# Patient Record
Sex: Female | Born: 1960 | Race: Black or African American | Hispanic: No | Marital: Married | Smoking: Never smoker
Health system: Southern US, Community
[De-identification: ages and names within clinical notes are randomized; demographics above are authoritative.]

## PROBLEM LIST (undated history)

## (undated) DIAGNOSIS — F32A Depression, unspecified: Secondary | ICD-10-CM

## (undated) DIAGNOSIS — F329 Major depressive disorder, single episode, unspecified: Secondary | ICD-10-CM

## (undated) DIAGNOSIS — K219 Gastro-esophageal reflux disease without esophagitis: Secondary | ICD-10-CM

## (undated) DIAGNOSIS — R011 Cardiac murmur, unspecified: Secondary | ICD-10-CM

## (undated) DIAGNOSIS — F419 Anxiety disorder, unspecified: Secondary | ICD-10-CM

## (undated) DIAGNOSIS — M069 Rheumatoid arthritis, unspecified: Secondary | ICD-10-CM

## (undated) DIAGNOSIS — M1990 Unspecified osteoarthritis, unspecified site: Secondary | ICD-10-CM

## (undated) DIAGNOSIS — I10 Essential (primary) hypertension: Secondary | ICD-10-CM

## (undated) DIAGNOSIS — T7840XA Allergy, unspecified, initial encounter: Secondary | ICD-10-CM

## (undated) DIAGNOSIS — M2550 Pain in unspecified joint: Secondary | ICD-10-CM

## (undated) DIAGNOSIS — Z803 Family history of malignant neoplasm of breast: Secondary | ICD-10-CM

## (undated) DIAGNOSIS — Z800 Family history of malignant neoplasm of digestive organs: Secondary | ICD-10-CM

## (undated) HISTORY — DX: Pain in unspecified joint: M25.50

## (undated) HISTORY — DX: Depression, unspecified: F32.A

## (undated) HISTORY — DX: Rheumatoid arthritis, unspecified: M06.9

## (undated) HISTORY — DX: Family history of malignant neoplasm of digestive organs: Z80.0

## (undated) HISTORY — DX: Gastro-esophageal reflux disease without esophagitis: K21.9

## (undated) HISTORY — DX: Unspecified osteoarthritis, unspecified site: M19.90

## (undated) HISTORY — DX: Essential (primary) hypertension: I10

## (undated) HISTORY — DX: Major depressive disorder, single episode, unspecified: F32.9

## (undated) HISTORY — DX: Anxiety disorder, unspecified: F41.9

## (undated) HISTORY — DX: Family history of malignant neoplasm of breast: Z80.3

## (undated) HISTORY — DX: Allergy, unspecified, initial encounter: T78.40XA

## (undated) HISTORY — PX: OTHER SURGICAL HISTORY: SHX169

---

## 1998-07-21 ENCOUNTER — Ambulatory Visit (HOSPITAL_COMMUNITY): Admission: RE | Admit: 1998-07-21 | Discharge: 1998-07-21 | Payer: Self-pay | Admitting: Obstetrics and Gynecology

## 1998-07-21 ENCOUNTER — Encounter: Payer: Self-pay | Admitting: Obstetrics and Gynecology

## 1999-03-09 ENCOUNTER — Other Ambulatory Visit: Admission: RE | Admit: 1999-03-09 | Discharge: 1999-03-09 | Payer: Self-pay | Admitting: Obstetrics and Gynecology

## 1999-09-11 ENCOUNTER — Emergency Department (HOSPITAL_COMMUNITY): Admission: EM | Admit: 1999-09-11 | Discharge: 1999-09-11 | Payer: Self-pay | Admitting: Emergency Medicine

## 1999-09-11 ENCOUNTER — Encounter: Payer: Self-pay | Admitting: Emergency Medicine

## 1999-09-21 ENCOUNTER — Encounter: Payer: Self-pay | Admitting: Emergency Medicine

## 1999-09-21 ENCOUNTER — Emergency Department (HOSPITAL_COMMUNITY): Admission: EM | Admit: 1999-09-21 | Discharge: 1999-09-21 | Payer: Self-pay | Admitting: Emergency Medicine

## 1999-12-30 ENCOUNTER — Other Ambulatory Visit: Admission: RE | Admit: 1999-12-30 | Discharge: 1999-12-30 | Payer: Self-pay | Admitting: Family Medicine

## 2000-01-20 ENCOUNTER — Encounter: Admission: RE | Admit: 2000-01-20 | Discharge: 2000-01-20 | Payer: Self-pay | Admitting: Family Medicine

## 2000-01-20 ENCOUNTER — Encounter: Payer: Self-pay | Admitting: Family Medicine

## 2000-09-22 ENCOUNTER — Other Ambulatory Visit: Admission: RE | Admit: 2000-09-22 | Discharge: 2000-09-22 | Payer: Self-pay | Admitting: Obstetrics and Gynecology

## 2000-11-07 ENCOUNTER — Inpatient Hospital Stay (HOSPITAL_COMMUNITY): Admission: RE | Admit: 2000-11-07 | Discharge: 2000-11-11 | Payer: Self-pay | Admitting: Obstetrics and Gynecology

## 2000-11-07 ENCOUNTER — Encounter (INDEPENDENT_AMBULATORY_CARE_PROVIDER_SITE_OTHER): Payer: Self-pay | Admitting: Specialist

## 2001-06-13 ENCOUNTER — Other Ambulatory Visit: Admission: RE | Admit: 2001-06-13 | Discharge: 2001-06-13 | Payer: Self-pay | Admitting: Obstetrics and Gynecology

## 2001-11-02 ENCOUNTER — Inpatient Hospital Stay (HOSPITAL_COMMUNITY): Admission: AD | Admit: 2001-11-02 | Discharge: 2001-11-02 | Payer: Self-pay | Admitting: Obstetrics and Gynecology

## 2001-12-21 ENCOUNTER — Inpatient Hospital Stay (HOSPITAL_COMMUNITY): Admission: AD | Admit: 2001-12-21 | Discharge: 2001-12-24 | Payer: Self-pay | Admitting: Obstetrics and Gynecology

## 2002-01-01 ENCOUNTER — Encounter: Admission: RE | Admit: 2002-01-01 | Discharge: 2002-01-31 | Payer: Self-pay | Admitting: Obstetrics and Gynecology

## 2002-02-05 ENCOUNTER — Other Ambulatory Visit: Admission: RE | Admit: 2002-02-05 | Discharge: 2002-02-05 | Payer: Self-pay | Admitting: Obstetrics and Gynecology

## 2002-08-15 HISTORY — PX: ABDOMINAL HYSTERECTOMY: SHX81

## 2002-12-16 ENCOUNTER — Encounter: Admission: RE | Admit: 2002-12-16 | Discharge: 2003-03-16 | Payer: Self-pay | Admitting: Family Medicine

## 2003-01-06 ENCOUNTER — Ambulatory Visit (HOSPITAL_COMMUNITY): Admission: RE | Admit: 2003-01-06 | Discharge: 2003-01-06 | Payer: Self-pay | Admitting: Gastroenterology

## 2003-03-13 ENCOUNTER — Other Ambulatory Visit: Admission: RE | Admit: 2003-03-13 | Discharge: 2003-03-13 | Payer: Self-pay | Admitting: Obstetrics and Gynecology

## 2004-01-09 ENCOUNTER — Emergency Department (HOSPITAL_COMMUNITY): Admission: EM | Admit: 2004-01-09 | Discharge: 2004-01-10 | Payer: Self-pay | Admitting: Emergency Medicine

## 2004-02-17 ENCOUNTER — Encounter: Admission: RE | Admit: 2004-02-17 | Discharge: 2004-02-17 | Payer: Self-pay | Admitting: Family Medicine

## 2004-03-30 ENCOUNTER — Other Ambulatory Visit: Admission: RE | Admit: 2004-03-30 | Discharge: 2004-03-30 | Payer: Self-pay | Admitting: Obstetrics and Gynecology

## 2004-11-14 ENCOUNTER — Emergency Department (HOSPITAL_COMMUNITY): Admission: EM | Admit: 2004-11-14 | Discharge: 2004-11-14 | Payer: Self-pay | Admitting: Family Medicine

## 2006-11-26 ENCOUNTER — Emergency Department (HOSPITAL_COMMUNITY): Admission: EM | Admit: 2006-11-26 | Discharge: 2006-11-26 | Payer: Self-pay | Admitting: Emergency Medicine

## 2007-01-19 ENCOUNTER — Inpatient Hospital Stay (HOSPITAL_COMMUNITY): Admission: RE | Admit: 2007-01-19 | Discharge: 2007-01-21 | Payer: Self-pay | Admitting: Obstetrics and Gynecology

## 2007-01-19 ENCOUNTER — Encounter (INDEPENDENT_AMBULATORY_CARE_PROVIDER_SITE_OTHER): Payer: Self-pay | Admitting: Obstetrics and Gynecology

## 2010-07-09 ENCOUNTER — Ambulatory Visit (HOSPITAL_COMMUNITY)
Admission: RE | Admit: 2010-07-09 | Discharge: 2010-07-09 | Payer: Self-pay | Source: Home / Self Care | Admitting: Obstetrics and Gynecology

## 2010-07-19 ENCOUNTER — Encounter: Admission: RE | Admit: 2010-07-19 | Discharge: 2010-07-19 | Payer: Self-pay | Admitting: Obstetrics and Gynecology

## 2010-12-31 NOTE — H&P (Signed)
Aimee [name], Aimee [name]                ACCOUNT NO.:  [id number]   MEDICAL RECORD NO.:  [id number]          PATIENT TYPE:  AMB   LOCATION:  SDC                           FACILITY:  [facility]   PHYSICIAN:  Duke Salvia. Marcelle Overlie, M.D.DATE OF BIRTH:  05-05-1961   DATE OF ADMISSION:  DATE OF DISCHARGE:                              HISTORY & PHYSICAL   CHIEF COMPLAINT:  Pelvic pain.   HISTORY OF PRESENT ILLNESS:  50 year old who has a complicated  obstetrical history.  She had a vaginal delivery in 1984 and then from  1992 to 1995 she had four ectopic pregnancies that variously required  salpingostomy or salpingectomy.  In 2002, she had a myomectomy and did  have omental adhesions noted at that time, had IVF in late 2002, primary  cesarean section in 2003, both tubes were surgically absent at that time  and she had some significant small bowel adhesions noted above her  Pfannenstiel incision.  She has continued to have problems with  worsening pelvic pain over the last six months.  Ultrasound recently in  our office showed several new fibroids, possible adenomyosis, with a  complex cystic mass on the right side with some areas of septation and  probable corpus luteum cysts.  The dimensions were 3.7 x 2.7 x 3.0.  No  free fluid was noted.  Due to the severity of the pain, she presents now  for definitive TAH, possible BSO, after bowel prep.  This procedure,  including risk of bleeding, infection, adjacent organ injury, along with  other complications related to wound infection, phlebitis, transfusion,  along with her expected recovery time and the possible need for ERT  discussed.  Unless she has one completely normal ovary, her preference  would be to have both ovaries removed.   PAST MEDICAL HISTORY:   ALLERGIES:  None.   PAST SURGICAL HISTORY:  Myomectomy, laparotomy for  salpingostomy/salpingectomy, and cesarean section.   FAMILY HISTORY:  Significant for hypertension - mother, father, and  patient.  Her dad had a stroke.   CURRENT MEDICATIONS:  Xanax, Lexapro, and Diovan.   PHYSICAL EXAMINATION:  VITAL SIGNS:  Temp 98.2, blood pressure 130/78.  HEENT:  Unremarkable.  NECK:  Supple without masses.  LUNGS:  Clear.  CARDIOVASCULAR:  Regular rate and rhythm without murmurs, rubs, gallops.  BREASTS:  Without masses.  ABDOMEN:  Soft, flat, nontender.  PELVIC:  Normal external genitalia, vagina and cervix clear.  Uterus  upper limit of normal size, adnexa unremarkable.  EXTREMITIES:  Unremarkable.  NEUROLOGIC:  Unremarkable.   IMPRESSION:  1. Chronic pelvic pain.  2. History of prior myomectomy and pelvic adhesive disease.  3. Adenomyosis.   PLAN:  TAH, BSO.  Procedure and risks reviewed as above.      Richard M. Marcelle Overlie, M.D.  Electronically Signed     RM[facility]/MEDQ  D:  01/16/2007  T:  01/16/2007  Job:  [id number]

## 2010-12-31 NOTE — Op Note (Signed)
[facility]  Patient:    Aimee [name], Aimee [name]                       MRN: [id number] Proc. Date: 11/07/00 Adm. Date:  [id number] Attending:  Rhina Brackett                           Operative Report  PREOPERATIVE DIAGNOSIS:       Symptomatic leiomyoma with anemia.  POSTOPERATIVE DIAGNOSIS:      Symptomatic leiomyoma with anemia.  PROCEDURE:                    Myomectomy.  SURGEON:                      Duke Salvia. Marcelle Overlie, M.D.  ASSISTANT:                    Trevor Iha, M.D.  ANESTHESIA:                   General endotracheal.  COMPLICATIONS:                None.  DRAINS:                       Foley catheter.  ESTIMATED BLOOD LOSS:         350 cc.  DESCRIPTION OF PROCEDURE:     The patient was taken to the operating room. After an adequate level of general endotracheal anesthesia was obtained, the patient supine, the abdomen was prepped and draped in the usual manner for sterile abdominal procedures.  A Foley catheter was positioned.  A transverse incision was made through the old Pfannenstiel scar and carried down to the fascia, which was incised and extended transversely.  The rectus muscle was divided in the midline.  There were some omental adhesions noted, which were carefully dissected.  Small bleeders were secured with #3-0 Dexon free ties. No bowel was noted to be adherent into the abdominal wall.  Once the omental adhesions were freed, the pelvis was clear.  The bowel was packed superiorly out of the field.  A large fundal fibroid was delivered through the incision. Inspection of the remainder of the uterus revealed one other small fibroid 2.5 cm in the left anterior fundus.  Both tubes were surgically absent.  Both ovaries appeared to be normal.  There were some adhesions surrounding the right ovary for approximately 25% along the medial aspect of the broad ligament.  The left ovary was free and clear.  No other pelvic  abnormalities were noted.  Dilute Pitressin solution was then injected into the large fibroid.  An incision was made with the Bovie, and sharp and blunt dissection used to free a large 10.0 cm irregularly-shaped fibroid.  Dexon #0 deep sutures were used on the myometrium, and a mattress suture of #2-0 Dexon on the serosa, with excellent hemostasis.  The 2.5 cm anterior fibroid was removed through a separate small incision and repaired in a similar manner. These areas were hemostatic after irrigation, and interceed was placed.  Prior to closure the sponge, needle, and instrument counts were reported as correct x 2.  The rectus muscles were reapproximated with #3-0 Dexon interrupted suture.  The pouch was closed with a #0 Panacryl suture.  The subcutaneous fat was hemostatic.  Clips and Steri-Strips were  used on the skin.  She tolerated this well and went to the recovery room in good condition. DD:  11/07/00 TD:  11/07/00 Job: [id number]

## 2010-12-31 NOTE — Op Note (Signed)
[facility]  Patient:    Aimee [name], Aimee [name] Visit Number: [id number] MRN: [id number]          Service Type: OBS Location: 910A 9148 01 Attending Physician:  Rhina Brackett Dictated by:   Duke Salvia. Marcelle Overlie, M.D. Proc. Date: 12/20/01 Admit Date:  12/21/2001                             Operative Report  PREOPERATIVE DIAGNOSES:       Term intrauterine pregnancy, previous myomectomy.  POSTOPERATIVE DIAGNOSES:      Term intrauterine pregnancy, previous myomectomy, dense small bowel adhesions.  PROCEDURE:                    Primary cesarean section, lysis of extensive adhesions.  SURGEON:                      Duke Salvia. Marcelle Overlie, M.D.  ANESTHESIA:                   Spinal.  COMPLICATIONS:                None.  DRAINS:                       Foley catheter.  ESTIMATED BLOOD LOSS:         900.  PROCEDURE AND FINDINGS:       Patient taken to the operating room.  After an adequate level of spinal anesthetic was obtained with the patient supine, the abdomen was prepped and draped in usual manner for sterile abdominal procedures.  Foley catheter positioned draining clear urine.  Pfannenstiel incision made through the old myomectomy scar.  This was carried down to the fascia which was incised and extended transversely.  Rectus muscles divided in midline.  Peritoneum entered superiorly in a careful fashion due to her prior surgical history.  A clear window was noted but there were some bowel adherent into the upper margin of the incision which was avoided at that time since there was enough room inferior to delivery the fetus.  A bladder flap was delivered, transverse incision made in the lower segment, extended with bandage scissors.  A healthy female infant was then delivered.  Apgars 9 and 10.  The placenta delivered manually intact.  Uterus could not be exteriorized due to adhesions.  The uterine cavity was wiped clean with laparotomy  pack. Closure obtained first layer 0 chromic in a locked fashion followed by an embrocating layer of 0 chromic.  This was hemostatic.  The bladder flap area was well below and was intact.  After this was completed with sharp and blunt dissection, the small bowel was dissected free.  A number of adhesions were lysed in a vascular plane freeing up the small bowel adhesions into the upper incision.  Once this was freed up the bowel was run.  On careful inspection there were no mucosal defects.  Two areas where the serosa was partly denuted were repaired transversely with a running 3-0 silk suture.  Prior to closure sponge, needle, instrument counts reported as correct x2.  The rectus muscles reapproximated with 2-0 Dexon interrupted sutures.  Fascia closed from laterally to midline either side with a 0 PDS suture.  Steri-Strips and clips on the skin and a pressure dressing applied.  She did receive Ancef 1 g IV and Pitocin IV after the  cord was clamped.  She tolerated this well.  Went to recovery room in good condition. Dictated by:   Duke Salvia. Marcelle Overlie, M.D. Attending Physician:  Rhina Brackett DD:  12/21/01 TD:  12/24/01 Job: [id number]

## 2010-12-31 NOTE — Discharge Summary (Signed)
Aimee [name], Aimee [name]                ACCOUNT NO.:  [id number]   MEDICAL RECORD NO.:  [id number]          PATIENT TYPE:  INP   LOCATION:  9304                          FACILITY:  [facility]   PHYSICIAN:  Duke Salvia. Marcelle Overlie, M.D.DATE OF BIRTH:  20-Nov-1960   DATE OF ADMISSION:  01/19/2007  DATE OF DISCHARGE:  01/21/2007                               DISCHARGE SUMMARY   DISCHARGE DIAGNOSES:  1. Symptomatic leiomyoma, possible adenomyosis with pelvic adhesions.  2. Total abdominal hysterectomy/bilateral salpingo-oophorectomy this      admission.   SUMMARY OF THE HISTORY AND PHYSICAL EXAM:  Please see Admission H&P for  details.  Briefly, a 50 year old who has had prior myomectomy and 4  prior salpingostomy or salpingectomy procedures for ectopic pregnancy.  She presents with worsening pelvic pain.  Ultrasound showed recurrent  leiomyoma with adenomyosis.  At the time of her last cesarean was noted  to have significant pelvic adhesions.   HOSPITAL COURSE:  On June 6, under general anesthesia, the patient  underwent laparotomy with lysis of adhesions, TAH/BSO.  On the first  postoperative day, her potassium had recovered to normal at 4,  hemoglobin 7.9, down from a preop of 10.9.  Her diet was advanced.  By  June 8, she was afebrile, tolerating a regular diet, ambulating without  difficulty, her abdominal exam was unremarkable and she was ready for  discharge at that point.   LABORATORY DATA:  CMET on admission normal except for sodium 131,  potassium 3.3, glucose 101.  Preop CBC:  A WBC 6.6, hemoglobin 10.6,  hematocrit 33.  Blood type is O positive, antibody screen is negative.  On June 7, SMA-7 was normal with potassium 4.  Postop on January 20, 2007:  A WBC 9.4, hemoglobin 7.9, hematocrit 24.3.   DISPOSITION:  1. The patient discharged on Tandem FE one p.o. daily, Tylox p.r.n.      pain.  2. Will return to the office in 2 days to have the clips removed.  3. Advised to report any incisional  redness or drainage, increased      pain or bleeding or fever over 101.  4. She was given specific instructions regarding diet, and exercise.   CONDITION:  Good.   ACTIVITY:  At graded increase.      Richard M. Marcelle Overlie, M.D.  Electronically Signed     RM[facility]/MEDQ  D:  01/21/2007  T:  01/21/2007  Job:  [id number]

## 2010-12-31 NOTE — Op Note (Signed)
   NAMEMARCEL, SORTER                          ACCOUNT NO.:  [id number]   MEDICAL RECORD NO.:  [id number]                   PATIENT TYPE:  AMB   LOCATION:  ENDO                                 FACILITY:  [facility]   PHYSICIAN:  James L. Malon Kindle., M.D.          DATE OF BIRTH:  1961/02/28   DATE OF PROCEDURE:  01/06/2003  DATE OF DISCHARGE:                                 OPERATIVE REPORT   PROCEDURE:  Colonoscopy.   MEDICATIONS:  Fentanyl 162.5 mcg, Versed 16 mg IV.   INDICATIONS:  Patient with strong family history of colon cancer.   DESCRIPTION OF PROCEDURE:  The procedure had been explained to the patient  and consent obtained.  With the patient in the left lateral decubitus  position, the Olympus adjustable pediatric colonoscope was inserted and  advanced.  The prep was excellent.  Using slight abdominal pressure, we were  able to easily advance to the cecum.  The ileocecal valve and the  appendiceal orifice were seen.  The scope was withdrawn and the cecum,  ascending colon, hepatic flexure, transverse colon, splenic flexure,  descending and sigmoid colon were seen well.  No significant diverticular  disease.  No polyps were seen throughout the entire colon.  The scope was  withdrawn.  The patient tolerated the procedure well.   ASSESSMENT:  Strong family history of colon cancer with negative  colonoscopy.   PLAN:  Recommend routine yearly Hemoccults and repeat a screening  colonoscopy in five years.                                                James L. Malon Kindle., M.D.    Waldron Session  D:  01/06/2003  T:  01/06/2003  Job:  [id number]   cc:   Duwayne Heck L. Mahaffey, M.D.  [address]  [place]  [place] [address]  Fax: [phone]

## 2010-12-31 NOTE — H&P (Signed)
[facility]  Patient:    Aimee [name], Aimee [name] Visit Number: [id number] MRN: [id number]          Service Type: OBS Location: 910A 9148 01 Attending Physician:  Rhina Brackett Dictated by:   Duke Salvia. Marcelle Overlie, M.D. Admit Date:  12/21/2001                           History and Physical  CHIEF COMPLAINT:              For primary cesarean section at term.  HISTORY OF PRESENT ILLNESS:   A 50 year old G6 P1-0-4-1, EDD is Dec 29, 2001, presents for primary cesarean section with history of a prior myomectomy.  Her obstetrical history is complicated in that she has had one vaginal delivery in 1984, had an ectopic pregnancy four times - in 1992, 1993, 1994, and 1995.  In March 2002 she underwent myomectomy due to menorrhagia and anemia.  She conceived after one cycle of IVF - both tubes being absent from her ectopic surgery - and has had a relatively uncomplicated pregnancy.  She did have a preexisting history of chronic hypertension, but off medications has had a normal BP without medication during the pregnancy.  PAST MEDICAL HISTORY:  ALLERGIES:                    None.  OPERATIONS:                   Prior ectopic surgery and myomectomy.  MEDICATIONS:                  Prenatal vitamins and Celexa.  PRENATAL LABORATORY DATA:     Blood type O positive.  Rubella titer positive.  PHYSICAL EXAMINATION:  VITAL SIGNS:                  Temperature 98.2, blood pressure 120/84.  HEENT:                        Unremarkable.  NECK:                         Supple without masses.  LUNGS:                        Clear.  CARDIOVASCULAR:               Regular rate and rhythm without murmurs, rubs, or gallops noted.  BREASTS:                      Not examined.  ABDOMEN:                      Term fundal height, fetal heart rate 140.  PELVIC:                       Cervix was closed.  EXTREMITIES AND NEUROLOGIC:   Unremarkable.  IMPRESSION:                    1. Term intrauterine pregnancy, prior                                  myomectomy, history of ectopic pregnancy.  2. In vitro fertilization pregnancy.  PLAN:                         Primary cesarean section.  This procedure including risks relative to bleeding, infection, adjacent organ injury all reviewed with her, which she understands and accepts. Dictated by:   Duke Salvia. Marcelle Overlie, M.D. Attending Physician:  Rhina Brackett DD:  12/21/01 TD:  12/21/01 Job: [phone]

## 2010-12-31 NOTE — H&P (Signed)
[facility]  Patient:    Aimee [name], Aimee [name]                         MRN: [id number] Attending:  Duke Salvia. Marcelle Overlie, M.D.                         History and Physical  CHIEF COMPLAINT:              1. Anemia.                               2. Chronic pelvic pain secondary to leiomyoma.  HISTORY OF PRESENT ILLNESS:   The patient is a 50 year old G5 P1, currently on Celexa and Xanax per Dr. Evelene Croon.  She has had problems with worsening menorrhagia and dysmenorrhea.  The previously noted fibroid has increased by 9 cm.  She presents now for myomectomy.  PAST MEDICAL HISTORY:         1. Hypertension.                               2. Her history is significant for four prior                                  ectopic pregnancies after a vaginal delivery                                  in 1984.  She has had several laparotomies                                  for treatment of ectopic pregnancy and                                  several treated by laparoscopic procedures.  ALLERGIES:                    No known drug allergies.  CURRENT MEDICATIONS:          1. Blood pressure medications.                               2. Celexa.                               3. Xanax.  PAST SURGICAL HISTORY:        1. Fracture of left arm at age 80.                               2. Ectopic pregnancies, as mentioned, with                                  secondary infertility.  I have consulted  Dr. Elesa Hacker about IVF.  The most recent                                  ultrasound obtained on September 28, 2000                                  showed a large submucous fibroid 10 x 9                                  x 9.6 cm.  PHYSICAL EXAMINATION:  VITAL SIGNS:                  Temperature 98.2 degrees, blood pressure 120/90.  HEENT:                        Unremarkable.  NECK:                         Supple, without masses.  LUNGS:                         Clear.  CARDIOVASCULAR:               Regular rate and rhythm, without murmurs, rubs, or gallops.  BREAST:                       Without masses.  ABDOMEN:                      Soft, flat, nontender.  PELVIC:                       Normal external genitalia, vagina; cervix clear; uterus 12-14 week size and irregular; adnexa unremarkable.  EXTREMITIES:                  Unremarkable.  NEUROLOGIC:                   Unremarkable.  IMPRESSION:                   Symptomatic leiomyoma.  PLAN:                         Myomectomy.  This procedure including risk of bleeding, infection, adjacent organ injury, possible need for transfusion, the need to be delivered by cesarean section have all been discussed with her. 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  We discussed the possibility of wound infection, phlebitis, and her expected recovery time.  All her questions were answered. DD:  11/02/00 TD:  11/02/00 Job: [id number]

## 2010-12-31 NOTE — Op Note (Signed)
NAMECRYSTALMARIE, YASIN                ACCOUNT NO.:  [id number]   MEDICAL RECORD NO.:  [id number]          PATIENT TYPE:  AMB   LOCATION:  SDC                           FACILITY:  [facility]   PHYSICIAN:  Duke Salvia. Marcelle Overlie, M.D.DATE OF BIRTH:  [birth year]   DATE OF PROCEDURE:  01/19/2007  DATE OF DISCHARGE:                               OPERATIVE REPORT   PREOPERATIVE DIAGNOSIS:  Pelvic pain, leiomyoma history of prior  myomectomy, adenomyosis.   POSTOPERATIVE DIAGNOSIS:  Pelvic pain, leiomyoma history of prior  myomectomy, adenomyosis, plus pelvic adhesions.   PROCEDURE:  TAH-BSO.   SURGEON:  Dr. Marcelle Overlie   ASSISTANT:  Dr. Rana Snare.   ANESTHESIA:  General endotracheal.   COMPLICATIONS:  None.   DRAINS:  Foley catheter.   BLOOD LOSS:  250 mL.   SPECIMENS REMOVED:  The uterus, bilateral tubes and ovaries.   PROCEDURE AND FINDINGS:  The patient taken to the operating room, after  an adequate level of general endotracheal anesthesia was obtained, the  patient supine, the abdomen prepped and draped with Betadine.  The  vagina was then prepped separately with Betadine.  Foley catheter  positioned.  Transverse incision made excising the old scar, carried  down fascia which was incised and extended transversely.  Rectus muscle  divided midline.  Peritoneum entered superiorly without incident and  extended in vertical fashion.  Loop of bowel was noted to be adherent  into the right lower quadrant. With traction and counter traction this  was dissected free in avascular plane.  There were several loops of  bowel that were adherent to each other that were freed up by dissecting  filmy adhesions.  Once this was freed up the O'Connor-O'Sullivan  retractor was positioned.  Bowels packed superiorly out of the field and  she was placed in Trendelenburg.  The uterus itself was enlarged to 8-10  weeks size.  There was a degenerated fibroid coming off the fundus.  Bilateral adnexal adhesions were freed  up freeing up each ovary on each  side.  [name] clamps were placed at each utero-ovarian pedicle. Starting  on the left the round ligament was clamped, divided and suture-ligated  with 0 Vicryl.  The peritoneum was carried around anteriorly and the  retroperitoneal space on the left was developed. Course of the ureter  was well below. The left IP ligament was then isolated, clamped, divided  first free tied followed by suture ligature 0 Dexon assuring that the  ureter was well below.  The exact same dissection repeated on the  opposite side, carefully identifying the ureter well below.  Peritoneum  then carried around each side to the midline.  The bladder was sharply  and bluntly dissected below the cervical vaginal junction. Uterine  vasculature pedicle on either side was skeletonized. LigaSure was used  at that point, was clamped, coagulated and divided, staying close to the  uterus and assuring that the bladder was well below. The upper broad  ligament, cardinal ligament were coagulated and divided. Curved Heaney  clamps were then used to clamp, divide and suture-ligated in Heaney  fashion the cervical  vaginal junction and the specimen was excised.  The  cuff was closed with a running lock 2-0 Vicryl suture.  Pelvis was then  irrigated with saline and aspirated.  All the operative site areas were  inspected and noted to be hemostatic.  McCall's culdoplasty suture was  then placed from picking up left uterosacral ligament, posterior  peritoneum across to the right uterosacral ligament which was tied down  for extra posterior support.  Prior to closure sponge, needle, and  instrument counts reported as correct x2.  Peritoneum closed with a  running 2-0 Vicryl suture. 2-0 Vicryl was used in interrupted sutures to  reapproximate the rectus muscles.  A 0 PDS was then used from laterally  to midline on either side to close the fascia.  A subfascial and  subcutaneous On-Q catheter was  positioned through separate incisions.  The subcutaneous tissue was undermined to allow for better closure.  Rendered hemostatic with Bovie. Incision closed with Steri-Strips and  clips and a pressure dressing.  The On-Q catheters were primed per  protocol.  She tolerated this well, went to recovery room in good  condition.      Richard M. Marcelle Overlie, M.D.  Electronically Signed     RMH/MEDQ  D:  01/19/2007  T:  01/19/2007  Job:  [id number]

## 2010-12-31 NOTE — Discharge Summary (Signed)
[facility]  Patient:    Aimee [name], Aimee [name] Visit Number: [id number] MRN: [id number]          Service Type: OBS Location: [id number] Attending Physician:  Rhina Brackett Dictated by:   Julio Sicks, N.P. Admit Date:  12/21/2001 Discharge Date: 12/24/2001                             Discharge Summary  ADMISSION DIAGNOSES:          1. Intrauterine pregnancy at term.                               2. Previous myomectomy.  DISCHARGE DIAGNOSES:          1. Primary low transverse cesarean delivery.                               2. Lysis of adhesions.                               3. Viable female infant.  PROCEDURE:                    1. Primary low transverse cesarean section.                               2. Lysis of adhesions.  REASON FOR ADMISSION:         Please see dictated H&P.  HOSPITAL COURSE:              The patient was admitted to the hospital for a scheduled primary cesarean delivery.  The patient was taken to the operating room where spinal anesthesia was administered without complication.  A low transverse incision was made with delivery of a viable female infant weighing 7 pounds 14 ounces with Apgars of 9 at one minute and 10 at 10 minutes.  The patient tolerated the procedure well and was taken to the recovery room. On postoperative day one temperature was noted to be maximum of 101.3.  IV antibiotics were administered.  Abdomen was soft.  Fundus was firm. Incision was clean, dry and intact.  The patient had good return of bowel function. She was tolerating a clear liquid diet without complaint.  Lungs were auscultated without evidence of wheeze, rales or rhonchi.  Hemoglobin was 8.3 and WBC count was 9.5.  On postoperative day two, the patient was afebrile. Abdomen was soft. Incision was clean, dry and intact.  IV antibiotics were discontinued.  On postoperative day three, the patient was doing well. Abdomen was soft.  Incision was clean, dry and intact.  Staples were removed and the patient was discharged home.  CONDITION ON DISCHARGE:       Good.  DIET:                         Regular as tolerated.  ACTIVITY:                     No heavy lifting.  No vaginal entry.  No driving times two weeks.  FOLLOW-UP:  The patient is to follow up in the office in one to two weeks for an incision check.  She is to call for a temperature greater than 100 degrees, persistent nausea and vomiting, heavy vaginal bleeding and/or redness or drainage from the incision site.  DISCHARGE MEDICATIONS:        1. Tylox one to two q.4-6h.                               2. Ferrous sulfate 325 mg one p.o. b.i.d.                               3. Prenatal vitamins one p.o. q.d. Dictated by:   Julio Sicks, N.P. Attending Physician:  Rhina Brackett DD:  01/09/02 TD:  01/10/02 Job: [id number]

## 2011-06-02 LAB — CBC
HCT: 24.3 — ABNORMAL LOW
HCT: 33 — ABNORMAL LOW
Hemoglobin: 10.6 — ABNORMAL LOW
MCHC: 32
MCV: 71.8 — ABNORMAL LOW
Platelets: 250
RBC: 4.63
RDW: 17.3 — ABNORMAL HIGH
RDW: 17.3 — ABNORMAL HIGH
WBC: 9.4

## 2011-06-02 LAB — COMPREHENSIVE METABOLIC PANEL
ALT: 16
Alkaline Phosphatase: 57
BUN: 6
CO2: 26
Calcium: 9
GFR calc non Af Amer: >60
Glucose, Bld: 101 — ABNORMAL HIGH
Potassium: 3.3 — ABNORMAL LOW
Sodium: 131 — ABNORMAL LOW
Total Protein: 7.6

## 2011-06-02 LAB — ELECTROLYTE PANEL
Chloride: 105
Potassium: 4
Sodium: 136

## 2011-06-02 LAB — TYPE AND SCREEN
ABO/RH(D): O POS
Antibody Screen: NEGATIVE

## 2011-08-16 HISTORY — PX: KNEE ARTHROSCOPY: SUR90

## 2012-09-06 ENCOUNTER — Other Ambulatory Visit: Payer: Self-pay | Admitting: Sports Medicine

## 2012-09-06 DIAGNOSIS — M25569 Pain in unspecified knee: Secondary | ICD-10-CM

## 2012-09-10 ENCOUNTER — Other Ambulatory Visit: Payer: Self-pay

## 2012-09-14 ENCOUNTER — Ambulatory Visit
Admission: RE | Admit: 2012-09-14 | Discharge: 2012-09-14 | Disposition: A | Discharge status: Home / Self Care | Payer: 59 | Source: Ambulatory Visit | Attending: Sports Medicine | Admitting: Sports Medicine

## 2012-09-14 DIAGNOSIS — M25569 Pain in unspecified knee: Secondary | ICD-10-CM

## 2013-01-01 ENCOUNTER — Other Ambulatory Visit: Payer: Self-pay | Admitting: Obstetrics and Gynecology

## 2013-01-01 DIAGNOSIS — R928 Other abnormal and inconclusive findings on diagnostic imaging of breast: Secondary | ICD-10-CM

## 2013-01-11 ENCOUNTER — Ambulatory Visit
Admission: RE | Admit: 2013-01-11 | Discharge: 2013-01-11 | Disposition: A | Discharge status: Home / Self Care | Payer: 59 | Source: Ambulatory Visit | Attending: Obstetrics and Gynecology | Admitting: Obstetrics and Gynecology

## 2013-01-11 DIAGNOSIS — R928 Other abnormal and inconclusive findings on diagnostic imaging of breast: Secondary | ICD-10-CM

## 2013-03-01 LAB — HM COLONOSCOPY

## 2013-07-09 ENCOUNTER — Other Ambulatory Visit (HOSPITAL_COMMUNITY): Payer: 59

## 2013-07-22 ENCOUNTER — Other Ambulatory Visit (HOSPITAL_COMMUNITY): Payer: 59

## 2013-08-06 ENCOUNTER — Other Ambulatory Visit (HOSPITAL_COMMUNITY): Payer: 59

## 2013-08-23 ENCOUNTER — Ambulatory Visit (HOSPITAL_COMMUNITY): Payer: 59 | Attending: Internal Medicine | Admitting: Radiology

## 2013-08-23 ENCOUNTER — Other Ambulatory Visit (HOSPITAL_COMMUNITY): Payer: Self-pay | Admitting: Family Medicine

## 2013-08-23 ENCOUNTER — Encounter: Payer: Self-pay | Admitting: Internal Medicine

## 2013-08-23 ENCOUNTER — Other Ambulatory Visit: Payer: Self-pay

## 2013-08-23 DIAGNOSIS — Z8249 Family history of ischemic heart disease and other diseases of the circulatory system: Secondary | ICD-10-CM | POA: Insufficient documentation

## 2013-08-23 DIAGNOSIS — Z6841 Body Mass Index (BMI) 40.0 and over, adult: Secondary | ICD-10-CM | POA: Insufficient documentation

## 2013-08-23 DIAGNOSIS — R011 Cardiac murmur, unspecified: Secondary | ICD-10-CM

## 2013-08-23 NOTE — Progress Notes (Signed)
Echocardiogram performed.  

## 2014-12-09 DIAGNOSIS — L723 Sebaceous cyst: Secondary | ICD-10-CM | POA: Insufficient documentation

## 2015-04-01 ENCOUNTER — Other Ambulatory Visit: Payer: Self-pay | Admitting: Obstetrics and Gynecology

## 2015-04-02 LAB — CYTOLOGY - PAP

## 2016-05-18 LAB — HM MAMMOGRAPHY

## 2016-06-27 ENCOUNTER — Other Ambulatory Visit: Payer: Self-pay | Admitting: Hematology and Oncology

## 2016-06-27 DIAGNOSIS — M519 Unspecified thoracic, thoracolumbar and lumbosacral intervertebral disc disorder: Secondary | ICD-10-CM

## 2016-06-29 ENCOUNTER — Other Ambulatory Visit: Payer: Self-pay

## 2016-09-30 DIAGNOSIS — B349 Viral infection, unspecified: Secondary | ICD-10-CM | POA: Diagnosis not present

## 2016-10-13 DIAGNOSIS — M25461 Effusion, right knee: Secondary | ICD-10-CM | POA: Diagnosis not present

## 2016-10-13 DIAGNOSIS — M170 Bilateral primary osteoarthritis of knee: Secondary | ICD-10-CM | POA: Diagnosis not present

## 2016-10-13 DIAGNOSIS — M1711 Unilateral primary osteoarthritis, right knee: Secondary | ICD-10-CM | POA: Diagnosis not present

## 2016-10-13 DIAGNOSIS — M25561 Pain in right knee: Secondary | ICD-10-CM | POA: Diagnosis not present

## 2016-10-13 DIAGNOSIS — M25562 Pain in left knee: Secondary | ICD-10-CM | POA: Diagnosis not present

## 2016-10-18 DIAGNOSIS — M25561 Pain in right knee: Secondary | ICD-10-CM | POA: Diagnosis not present

## 2016-10-18 DIAGNOSIS — M1711 Unilateral primary osteoarthritis, right knee: Secondary | ICD-10-CM | POA: Diagnosis not present

## 2016-10-25 DIAGNOSIS — M1711 Unilateral primary osteoarthritis, right knee: Secondary | ICD-10-CM | POA: Diagnosis not present

## 2016-10-25 DIAGNOSIS — M25561 Pain in right knee: Secondary | ICD-10-CM | POA: Diagnosis not present

## 2016-12-26 DIAGNOSIS — I10 Essential (primary) hypertension: Secondary | ICD-10-CM | POA: Diagnosis not present

## 2016-12-26 DIAGNOSIS — E559 Vitamin D deficiency, unspecified: Secondary | ICD-10-CM | POA: Diagnosis not present

## 2016-12-26 DIAGNOSIS — J309 Allergic rhinitis, unspecified: Secondary | ICD-10-CM | POA: Diagnosis not present

## 2017-04-06 ENCOUNTER — Encounter: Payer: Self-pay | Admitting: Physician Assistant

## 2017-04-06 ENCOUNTER — Ambulatory Visit (INDEPENDENT_AMBULATORY_CARE_PROVIDER_SITE_OTHER): Payer: 59 | Admitting: Physician Assistant

## 2017-04-06 VITALS — BP 140/88 | HR 85 | Temp 99.0°F | Ht 65.5 in | Wt 261.0 lb

## 2017-04-06 DIAGNOSIS — M1990 Unspecified osteoarthritis, unspecified site: Secondary | ICD-10-CM | POA: Diagnosis not present

## 2017-04-06 DIAGNOSIS — F419 Anxiety disorder, unspecified: Secondary | ICD-10-CM

## 2017-04-06 DIAGNOSIS — I10 Essential (primary) hypertension: Secondary | ICD-10-CM

## 2017-04-06 NOTE — Patient Instructions (Addendum)
It was great to meet you!  Please call Trey Paula at [phone] to discuss starting therapy, tell her you met with Aldona Bar today and I referred you!

## 2017-04-06 NOTE — Progress Notes (Signed)
Aimee [name] is a 56 y.o. female here to Establish and discuss anxiety, depression and arthritis in knees and right hip pain.  I acted as a Neurosurgeon for [employer], PA-C Aimee Mull, LPN  History of Present Illness:   Chief Complaint  Patient presents with  . Establish Care    [employer]  . Anxiety  . Right Hip pain  . Bilateral knee pain    R>L, arthritis    Anxiety -- 40 mg Prozac and 0.5 mg Xanax. Going through a lot at work. She is at risk for losing her job and this is where her stress is coming from. A position opened up at her job and she applied, she thought the interview went well but she didn't get it. Has been having some conflicts with her superior. She went to HR and questioned the manager's decision and is the process of hearing back from them. Has been on Prozac 40 mg daily x several years. No suicidal thoughts. She just saw her psychiatrist, Aimee [name], and these medications were refilled about 1-2 months ago.   R hip pain and R knee pain -- was a runner when she was younger. Has had nothing but problems since she "busted her knee" several years ago. She gets regular knee injections in her R knee. Has been told that she has arthritis. She is not active anymore secondary to knee and hip pain.  HTN -- 10 mg Norvasc -- been on this for awhile. BP goes up when she is stressed. Has monitor at home and fit bit monitors her BP as well. BP readings are 125/74 to 130/80.  Health Maintenance: Immunizations -- she will return for flu shot Colonoscopy -- 2 years ago, has been told to return q 5 years Mammogram -- 2017 PAP -- no longer receives 2/2 hystectomy Weight -- Weight: 261 lb (118.4 kg)   Depression screen PHQ 2/9 04/06/2017  Decreased Interest 0  Down, Depressed, Hopeless 1  PHQ - 2 Score 1  Altered sleeping 2  Tired, decreased energy 3  Change in appetite 1  Feeling bad or failure about yourself  1  Trouble concentrating 0  Moving slowly or fidgety/restless 0   Suicidal thoughts 0  PHQ-9 Score 8  Difficult doing work/chores Somewhat difficult    GAD 7 : Generalized Anxiety Score 04/06/2017  Nervous, Anxious, on Edge 3  Control/stop worrying 3  Worry too much - different things 3  Trouble relaxing 3  Restless 3  Easily annoyed or irritable 3  Afraid - awful might happen 3  Total GAD 7 Score 21    Other providers/specialists: Aimee [name] -- psychiatrist    Past Medical History:  Diagnosis Date  . Anxiety   . Arthritis   . Depression   . GERD (gastroesophageal reflux disease)   . Hypertension      Social History   Social History  . Marital status: Married    Spouse name: N/A  . Number of children: N/A  . Years of education: N/A   Occupational History  . Not on file.   Social History Main Topics  . Smoking status: Never Smoker  . Smokeless tobacco: Never Used  . Alcohol use No  . Drug use: No  . Sexual activity: Yes    Birth control/ protection: Surgical   Other Topics Concern  . Not on file   Social History Narrative   Goes by Aimee [name]   Works for [employer] -- working there for 24  years   Married, 2 kids (34 and 43) and 1 grandson       Past Surgical History:  Procedure Laterality Date  . ABDOMINAL HYSTERECTOMY  2004  . CESAREAN SECTION  2003  . KNEE ARTHROSCOPY Right 2013    Family History  Problem Relation Age of Onset  . Colon cancer Mother 92  . Heart disease Father   . Hypertension Father   . Heart attack Father 21  . Stroke Father   . Diabetes Sister   . Heart attack Sister     No Known Allergies   Current Medications:   Current Outpatient Prescriptions:  .  ALPRAZolam (XANAX) 0.5 MG tablet, Take 0.5 mg by mouth 4 (four) times daily as needed. , Disp: , Rfl:  .  amLODipine (NORVASC) 10 MG tablet, Take 10 mg by mouth daily. , Disp: , Rfl:  .  Cholecalciferol (VITAMIN D3) 1000 units CAPS, Take 1 capsule by mouth daily., Disp: , Rfl:  .  FLUoxetine (PROZAC) 40 MG capsule, Take 40 mg by  mouth daily. , Disp: , Rfl:  .  ibuprofen (ADVIL) 200 MG tablet, Take 400 mg by mouth as needed., Disp: , Rfl:  .  montelukast (SINGULAIR) 10 MG tablet, Take 10 mg by mouth at bedtime., Disp: , Rfl:  .  Multiple Vitamin (MULTIVITAMIN) tablet, Take 1 tablet by mouth daily., Disp: , Rfl:    Review of Systems:   Review of Systems  Constitutional: Negative for chills, fever, malaise/fatigue and weight loss.  HENT: Negative for hearing loss and tinnitus.   Eyes: Negative for blurred vision, double vision and photophobia.  Respiratory: Negative for cough.   Cardiovascular: Negative for chest pain and orthopnea.  Gastrointestinal: Negative for abdominal pain, constipation, diarrhea, nausea and vomiting.  Musculoskeletal: Positive for joint pain.  Neurological: Negative for dizziness, tingling, tremors, sensory change and headaches.  Psychiatric/Behavioral: Negative for depression and suicidal ideas. The patient is nervous/anxious.     Vitals:   Vitals:   04/06/17 0857  BP: (!) 158/100  Pulse: 85  Temp: 99 F (37.2 C)  TempSrc: Oral  SpO2: 95%  Weight: 261 lb (118.4 kg)  Height: 5' 5.5" (1.664 m)     Body mass index is 42.77 kg/m.  Physical Exam:   Physical Exam  Constitutional: She appears well-developed. She is cooperative.  Non-toxic appearance. She does not have a sickly appearance. She does not appear ill. No distress.  Cardiovascular: Normal rate, regular rhythm, S1 normal, S2 normal, normal heart sounds and normal pulses.   No LE edema  Pulmonary/Chest: Effort normal and breath sounds normal.  Neurological: She is alert. GCS eye subscore is 4. GCS verbal subscore is 5. GCS motor subscore is 6.  Skin: Skin is warm, dry and intact.  Psychiatric: Her speech is normal and behavior is normal. Her mood appears anxious.  Nursing note and vitals reviewed.   Assessment and Plan:    Aimee [name] was seen today for establish care, anxiety, right hip pain and bilateral knee  pain.  Diagnoses and all orders for this visit:  Hypertension, unspecified type Blood pressure readings at home are well controlled, has been on Norvasc 10 mg for several years. Continue to monitor BP at home and keep [place] informed if BP at home changes or develops side effects.  Anxiety GAD-7 is 21 today. I have provided her with Colen Darling' number for therapy. She will reach out to Aimee [name] if needed for medication adjustment. I discussed with patient that if  they develop any SI, to tell someone immediately and seek medical attention. I have written her out of work for a few days for her to take some time to herself.  Arthritis Currently well controlled. She gets routine injections in her R knee -- I recommended that she consider transferring that care to our sports medicine provider, Dr. Berline Chough for this, if she would like.   . Reviewed expectations re: course of current medical issues. . Discussed self-management of symptoms. . Outlined signs and symptoms indicating need for more acute intervention. . Patient verbalized understanding and all questions were answered. . See orders for this visit as documented in the electronic medical record. . Patient received an After-Visit Summary.  CMA or LPN served as scribe during this visit. History, Physical, and Plan performed by medical provider. Documentation and orders reviewed and attested to.  [name], PA-C

## 2017-04-19 ENCOUNTER — Telehealth: Payer: Self-pay | Admitting: Physician Assistant

## 2017-04-19 ENCOUNTER — Ambulatory Visit (INDEPENDENT_AMBULATORY_CARE_PROVIDER_SITE_OTHER): Payer: 59 | Admitting: Physician Assistant

## 2017-04-19 ENCOUNTER — Encounter: Payer: Self-pay | Admitting: Physician Assistant

## 2017-04-19 VITALS — BP 130/80 | HR 52 | Temp 98.7°F | Ht 65.5 in | Wt 260.2 lb

## 2017-04-19 DIAGNOSIS — J069 Acute upper respiratory infection, unspecified: Secondary | ICD-10-CM | POA: Diagnosis not present

## 2017-04-19 MED ORDER — BENZONATATE 200 MG PO CAPS: 200.0000 mg | ORAL_CAPSULE | Freq: Two times a day (BID) | ORAL | 0 refills | Status: DC | PRN

## 2017-04-19 MED ORDER — FLUTICASONE PROPIONATE 50 MCG/ACT NA SUSP: 2.0000 | Freq: Every day | NASAL | 2 refills | Status: DC

## 2017-04-19 NOTE — Progress Notes (Signed)
Aimee [name] is a 56 y.o. female here for sinus problem.  I acted as a Neurosurgeon for [employer], PA-C Corky Mull, LPN  History of Present Illness:   Chief Complaint  Patient presents with  . Sinus Problem    x 2 weeks    Sinus Problem  This is a recurrent problem. Episode onset: Pt has been having sinus issues x 2 weeks. The problem has been gradually worsening since onset. There has been no fever. Her pain is at a severity of 3/10. The pain is mild. Associated symptoms include congestion, coughing, diaphoresis, ear pain, headaches, a hoarse voice, sinus pressure and sneezing. Pertinent negatives include no chills, neck pain, shortness of breath, sore throat or swollen glands. (Pt coughing and expectorating yellow sputum, nasal drainage yellow to clear. Ears are popping.) Treatments tried: Mucinex. The treatment provided no relief.   She does not have a history of sinus infections. She is currently on a staycation from work and trying to get as much rest as needed. She endorses normal appetite.   Past Medical History:  Diagnosis Date  . Anxiety   . Arthritis   . Depression   . GERD (gastroesophageal reflux disease)   . Hypertension      Social History   Social History  . Marital status: Married    Spouse name: N/A  . Number of children: N/A  . Years of education: N/A   Occupational History  . Not on file.   Social History Main Topics  . Smoking status: Never Smoker  . Smokeless tobacco: Never Used  . Alcohol use No  . Drug use: No  . Sexual activity: Yes    Birth control/ protection: Surgical   Other Topics Concern  . Not on file   Social History Narrative   Goes by [employer]   Works for [employer] -- working there for 24 years   Married, 2 kids (34 and 15) and 1 grandson       Past Surgical History:  Procedure Laterality Date  . ABDOMINAL HYSTERECTOMY  2004  . CESAREAN SECTION  2003  . KNEE ARTHROSCOPY Right 2013    Family History  Problem  Relation Age of Onset  . Colon cancer Mother 9  . Heart disease Father   . Hypertension Father   . Heart attack Father 38  . Stroke Father   . Diabetes Sister   . Heart attack Sister     No Known Allergies  Current Medications:   Current Outpatient Prescriptions:  .  ALPRAZolam (XANAX) 0.5 MG tablet, Take 0.5 mg by mouth 4 (four) times daily as needed. , Disp: , Rfl:  .  amLODipine (NORVASC) 10 MG tablet, Take 10 mg by mouth daily. , Disp: , Rfl:  .  Cholecalciferol (VITAMIN D3) 1000 units CAPS, Take 1 capsule by mouth daily., Disp: , Rfl:  .  FLUoxetine (PROZAC) 40 MG capsule, Take 40 mg by mouth daily. , Disp: , Rfl:  .  ibuprofen (ADVIL) 200 MG tablet, Take 400 mg by mouth as needed., Disp: , Rfl:  .  montelukast (SINGULAIR) 10 MG tablet, Take 10 mg by mouth at bedtime., Disp: , Rfl:  .  Multiple Vitamin (MULTIVITAMIN) tablet, Take 1 tablet by mouth daily., Disp: , Rfl:  .  benzonatate (TESSALON) 200 MG capsule, Take 1 capsule (200 mg total) by mouth 2 (two) times daily as needed for cough., Disp: 20 capsule, Rfl: 0 .  fluticasone (FLONASE) 50 MCG/ACT nasal spray, Place  2 sprays into both nostrils daily., Disp: 16 g, Rfl: 2   Review of Systems:   Review of Systems  Constitutional: Positive for diaphoresis. Negative for chills.  HENT: Positive for congestion, ear pain, hoarse voice, sinus pressure and sneezing. Negative for sore throat.   Respiratory: Positive for cough. Negative for shortness of breath.   Musculoskeletal: Negative for neck pain.  Neurological: Positive for headaches.    Vitals:   Vitals:   04/19/17 1106  BP: 130/80  Pulse: (!) 52  Temp: 98.7 F (37.1 C)  TempSrc: Oral  SpO2: 97%  Weight: 260 lb 4 oz (118 kg)  Height: 5' 5.5" (1.664 m)     Body mass index is 42.65 kg/m.  Physical Exam:   Physical Exam  Constitutional: She appears well-developed. She is cooperative.  Non-toxic appearance. She does not have a sickly appearance. She does not  appear ill. No distress.  HENT:  Head: Normocephalic and atraumatic.  Right Ear: Tympanic membrane, external ear and ear canal normal. Tympanic membrane is not erythematous, not retracted and not bulging.  Left Ear: Tympanic membrane, external ear and ear canal normal. Tympanic membrane is not erythematous, not retracted and not bulging.  Nose: Mucosal edema and rhinorrhea present. Right sinus exhibits no maxillary sinus tenderness and no frontal sinus tenderness. Left sinus exhibits no maxillary sinus tenderness and no frontal sinus tenderness.  Mouth/Throat: Uvula is midline. Posterior oropharyngeal erythema present. No posterior oropharyngeal edema. Tonsils are 1+ on the right. Tonsils are 1+ on the left. No tonsillar exudate.  Eyes: Conjunctivae and lids are normal.  Neck: Trachea normal.  Cardiovascular: Normal rate, regular rhythm, S1 normal, S2 normal, normal heart sounds and normal pulses.   No LE edema  Pulmonary/Chest: Effort normal and breath sounds normal. She has no decreased breath sounds. She has no wheezes. She has no rhonchi. She has no rales.  Lymphadenopathy:    She has no cervical adenopathy.  Neurological: She is alert. GCS eye subscore is 4. GCS verbal subscore is 5. GCS motor subscore is 6.  Skin: Skin is warm, dry and intact.  Psychiatric: She has a normal mood and affect. Her speech is normal and behavior is normal.  Nursing note and vitals reviewed.   Assessment and Plan:    Aimee [name] was seen today for sinus problem.  Diagnoses and all orders for this visit:  Viral upper respiratory illness Exam relatively benign. No red flags and vitals WNL. Suspect viral etiology. Discussed hydration, rest, continued adequate nutrition, and use of Flonase BID and Tessalon prn. Follow-up if symptoms worsen or persist.  Other orders -     fluticasone (FLONASE) 50 MCG/ACT nasal spray; Place 2 sprays into both nostrils daily. -     benzonatate (TESSALON) 200 MG capsule; Take 1  capsule (200 mg total) by mouth 2 (two) times daily as needed for cough.    . Reviewed expectations re: course of current medical issues. . Discussed self-management of symptoms. . Outlined signs and symptoms indicating need for more acute intervention. . Patient verbalized understanding and all questions were answered. . See orders for this visit as documented in the electronic medical record. . Patient received an After-Visit Summary.  CMA or LPN served as scribe during this visit. History, Physical, and Plan performed by medical provider. Documentation and orders reviewed and attested to.  Jarold MottoSamantha , PA-C

## 2017-04-19 NOTE — Patient Instructions (Signed)
It was great to see you!  Push fluids!!  Take Tessalon perles as needed during the day for your cough. Continue Mucinex to help loosen your mucus in your chest.  Use Flonase in nose in AM and PM.  Keep us posted if things don't improve or if they get worse. Rest!!!   Viral Illness, Adult Viruses are tiny germs that can get into a person's body and cause illness. There are many different types of viruses, and they cause many types of illness. Viral illnesses can range from mild to severe. They can affect various parts of the body. Common illnesses that are caused by a virus include colds and the flu. Viral illnesses also include serious conditions such as HIV/AIDS (human immunodeficiency virus/acquired immunodeficiency syndrome). A few viruses have been linked to certain cancers. What are the causes? Many types of viruses can cause illness. Viruses invade cells in your body, multiply, and cause the infected cells to malfunction or die. When the cell dies, it releases more of the virus. When this happens, you develop symptoms of the illness, and the virus continues to spread to other cells. If the virus takes over the function of the cell, it can cause the cell to divide and grow out of control, as is the case when a virus causes cancer. Different viruses get into the body in different ways. You can get a virus by:  Swallowing food or water that is contaminated with the virus.  Breathing in droplets that have been coughed or sneezed into the air by an infected person.  Touching a surface that has been contaminated with the virus and then touching your eyes, nose, or mouth.  Being bitten by an insect or animal that carries the virus.  Having sexual contact with a person who is infected with the virus.  Being exposed to blood or fluids that contain the virus, either through an open cut or during a transfusion.  If a virus enters your body, your body's defense system (immune system) will try  to fight the virus. You may be at higher risk for a viral illness if your immune system is weak. What are the signs or symptoms? Symptoms vary depending on the type of virus and the location of the cells that it invades. Common symptoms of the main types of viral illnesses include: Cold and flu viruses  Fever.  Headache.  Sore throat.  Muscle aches.  Nasal congestion.  Cough. Digestive system (gastrointestinal) viruses  Fever.  Abdominal pain.  Nausea.  Diarrhea. Liver viruses (hepatitis)  Loss of appetite.  Tiredness.  Yellowing of the skin (jaundice). Brain and spinal cord viruses  Fever.  Headache.  Stiff neck.  Nausea and vomiting.  Confusion or sleepiness. Skin viruses  Warts.  Itching.  Rash. Sexually transmitted viruses  Discharge.  Swelling.  Redness.  Rash. How is this treated? Viruses can be difficult to treat because they live within cells. Antibiotic medicines do not treat viruses because these drugs do not get inside cells. Treatment for a viral illness may include:  Resting and drinking plenty of fluids.  Medicines to relieve symptoms. These can include over-the-counter medicine for pain and fever, medicines for cough or congestion, and medicines to relieve diarrhea.  Antiviral medicines. These drugs are available only for certain types of viruses. They may help reduce flu symptoms if taken early. There are also many antiviral medicines for hepatitis and HIV/AIDS.  Some viral illnesses can be prevented with vaccinations. A common example is the  flu shot. Follow these instructions at home: Medicines   Take over-the-counter and prescription medicines only as told by your health care provider.  If you were prescribed an antiviral medicine, take it as told by your health care provider. Do not stop taking the medicine even if you start to feel better.  Be aware of when antibiotics are needed and when they are not needed.  Antibiotics do not treat viruses. If your health care provider thinks that you may have a bacterial infection as well as a viral infection, you may get an antibiotic. ? Do not ask for an antibiotic prescription if you have been diagnosed with a viral illness. That will not make your illness go away faster. ? Frequently taking antibiotics when they are not needed can lead to antibiotic resistance. When this develops, the medicine no longer works against the bacteria that it normally fights. General instructions  Drink enough fluids to keep your urine clear or pale yellow.  Rest as much as possible.  Return to your normal activities as told by your health care provider. Ask your health care provider what activities are safe for you.  Keep all follow-up visits as told by your health care provider. This is important. How is this prevented? Take these actions to reduce your risk of viral infection:  Eat a healthy diet and get enough rest.  Wash your hands often with soap and water. This is especially important when you are in public places. If soap and water are not available, use hand sanitizer.  Avoid close contact with friends and family who have a viral illness.  If you travel to areas where viral gastrointestinal infection is common, avoid drinking water or eating raw food.  Keep your immunizations up to date. Get a flu shot every year as told by your health care provider.  Do not share toothbrushes, nail clippers, razors, or needles with other people.  Always practice safe sex.  Contact a health care provider if:  You have symptoms of a viral illness that do not go away.  Your symptoms come back after going away.  Your symptoms get worse. Get help right away if:  You have trouble breathing.  You have a severe headache or a stiff neck.  You have severe vomiting or abdominal pain. This information is not intended to replace advice given to you by your health care provider.  Make sure you discuss any questions you have with your health care provider. Document Released: 12/11/2015 Document Revised: 01/13/2016 Document Reviewed: 12/11/2015 Elsevier Interactive Patient Education  [employer]2018 [employer]

## 2017-04-19 NOTE — Telephone Encounter (Signed)
[facility] fax to [facility] Dr. Wynelle LinkSun

## 2017-04-26 NOTE — Telephone Encounter (Signed)
[facility] 11 pages to [facility] Sam[facility]

## 2017-04-28 ENCOUNTER — Encounter: Payer: Self-pay | Admitting: Physician Assistant

## 2017-05-09 ENCOUNTER — Ambulatory Visit (INDEPENDENT_AMBULATORY_CARE_PROVIDER_SITE_OTHER): Payer: 59 | Admitting: Psychology

## 2017-05-09 DIAGNOSIS — F411 Generalized anxiety disorder: Secondary | ICD-10-CM | POA: Diagnosis not present

## 2017-05-26 ENCOUNTER — Encounter: Payer: Self-pay | Admitting: Sports Medicine

## 2017-05-26 ENCOUNTER — Ambulatory Visit (INDEPENDENT_AMBULATORY_CARE_PROVIDER_SITE_OTHER): Payer: 59 | Admitting: Sports Medicine

## 2017-05-26 VITALS — BP 118/84 | HR 80 | Ht 65.5 in | Wt 257.8 lb

## 2017-05-26 DIAGNOSIS — S43102A Unspecified dislocation of left acromioclavicular joint, initial encounter: Secondary | ICD-10-CM | POA: Diagnosis not present

## 2017-05-26 DIAGNOSIS — M545 Low back pain, unspecified: Secondary | ICD-10-CM | POA: Insufficient documentation

## 2017-05-26 MED ORDER — IBUPROFEN-FAMOTIDINE 800-26.6 MG PO TABS
1.0000 | ORAL_TABLET | Freq: Three times a day (TID) | ORAL | 0 refills | Status: DC | PRN
Start: 2017-05-26 — End: 2017-11-20

## 2017-05-26 MED ORDER — IBUPROFEN-FAMOTIDINE 800-26.6 MG PO TABS: 1.0000 | ORAL_TABLET | Freq: Three times a day (TID) | ORAL | 2 refills | Status: DC | PRN
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
## 2017-06-13 ENCOUNTER — Ambulatory Visit (INDEPENDENT_AMBULATORY_CARE_PROVIDER_SITE_OTHER): Payer: 59 | Admitting: Psychology

## 2017-06-13 DIAGNOSIS — F411 Generalized anxiety disorder: Secondary | ICD-10-CM

## 2017-06-23 ENCOUNTER — Ambulatory Visit: Payer: 59 | Admitting: Sports Medicine

## 2017-06-28 ENCOUNTER — Ambulatory Visit: Payer: 59 | Admitting: Sports Medicine

## 2017-07-21 ENCOUNTER — Ambulatory Visit (INDEPENDENT_AMBULATORY_CARE_PROVIDER_SITE_OTHER): Payer: 59 | Admitting: Psychology

## 2017-07-21 DIAGNOSIS — F411 Generalized anxiety disorder: Secondary | ICD-10-CM

## 2017-09-07 ENCOUNTER — Ambulatory Visit: Payer: 59 | Admitting: Psychology

## 2017-09-08 ENCOUNTER — Encounter: Payer: Self-pay | Admitting: Sports Medicine

## 2017-09-18 ENCOUNTER — Encounter: Payer: Self-pay | Admitting: Physical Therapy

## 2017-09-18 ENCOUNTER — Ambulatory Visit: Payer: 59 | Admitting: Sports Medicine

## 2017-09-25 ENCOUNTER — Ambulatory Visit: Payer: Self-pay

## 2017-09-25 ENCOUNTER — Encounter: Payer: Self-pay | Admitting: Sports Medicine

## 2017-09-25 ENCOUNTER — Ambulatory Visit (INDEPENDENT_AMBULATORY_CARE_PROVIDER_SITE_OTHER): Payer: 59

## 2017-09-25 ENCOUNTER — Ambulatory Visit: Payer: 59 | Admitting: Sports Medicine

## 2017-09-25 VITALS — BP 142/82 | HR 65 | Ht 65.5 in | Wt 257.4 lb

## 2017-09-25 DIAGNOSIS — M179 Osteoarthritis of knee, unspecified: Secondary | ICD-10-CM | POA: Insufficient documentation

## 2017-09-25 DIAGNOSIS — G8929 Other chronic pain: Secondary | ICD-10-CM

## 2017-09-25 DIAGNOSIS — M25562 Pain in left knee: Secondary | ICD-10-CM | POA: Diagnosis not present

## 2017-09-25 DIAGNOSIS — M25561 Pain in right knee: Secondary | ICD-10-CM

## 2017-09-25 DIAGNOSIS — M170 Bilateral primary osteoarthritis of knee: Secondary | ICD-10-CM

## 2017-09-25 DIAGNOSIS — M1710 Unilateral primary osteoarthritis, unspecified knee: Secondary | ICD-10-CM | POA: Insufficient documentation

## 2017-09-25 NOTE — Progress Notes (Signed)
Aimee FellsMichael D. Aimee Shinerigby, DO  [place] [facility] [place] [phone]  Aimee LundSharon R [name] - 57 y.o. female MRN [id number]  Date of birth: [birth year]  Visit Date: 09/25/2017  PCP: Jarold MottoWorley, Samantha, PA   Referred by: Jarold MottoWorley, Samantha, [place]   Scribe for today's visit: Christoper FabianMolly [name], LAT, ATC     SUBJECTIVE:  Aimee LundSharon R [name] is here for Follow-up (L ACJ pain -doing well, this has resolved) and New Patient (Initial Visit) (Bilateral knee pain)   Compared to the last office visit on 05/26/17, her previously described L ACJ and LBP symptoms are improving w/ no current s/s. Current symptoms are little to none & are nonradiating She has been taking IBU prn.  New issue: B knee pain.  Diagnosed w/ arthritis.  Prior arthroscopic knee sx on the R knee (menisectomy).  Pt reports no new injury and states that she takes IBU prn but states that she doesn't want to take meds.  Most recent x-rays about 2 yrs ago and [facility].  Knee pain worsened w/ prolonged standing, walking, sit-to-stand after prolonged sitting Alleviated w/ IBU. Therapies tried include: IBU prn   Other associated symptoms include: B knee swelling, popping/clicking.  N/T noted in the R LE to the R ankle.   ROS Reports night time disturbances. Denies fevers, chills, or night sweats. Denies unexplained weight loss. Denies personal history of cancer. Reports changes in bowel or bladder habits.  Weak bladder. Denies recent unreported falls. Denies new or worsening dyspnea or wheezing. Denies headaches or dizziness.  Reports numbness, tingling or weakness  In the extremities, yes in the R LE. Denies dizziness or presyncopal episodes Reports lower extremity edema .  Yes in B knees and in B ankles.   HISTORY & PERTINENT PRIOR DATA:  Prior History reviewed and updated per electronic medical record.  Significant history, findings, studies and interim changes include:  reports that  has never smoked. she  has never used smokeless tobacco. No results for input(s): HGBA1C, LABURIC, CREATINE in the last 8760 hours. No specialty comments available. Problem  Knee Osteoarthritis   Tricompartmental degenerative changes, most focally patellofemoral wear bilaterally. Status post injection on 09/25/2017     OBJECTIVE:  VS:  HT:5' 5.5" (166.4 cm)   WT:257 lb 6.4 oz (116.8 kg)  BMI:42.17    BP:(!) 142/82  HR:65bpm  TEMP: ( )  RESP:95 %   PHYSICAL EXAM: Constitutional: WDWN, Non-toxic appearing. Psychiatric: Alert & appropriately interactive.  Not depressed or anxious appearing. Respiratory: No increased work of breathing.  Trachea Midline Eyes: Pupils are equal.  EOM intact without nystagmus.  No scleral icterus  NEUROVASCULAR exam: No clubbing or cyanosis appreciated No significant venous stasis changes Capillary Refill: normal, less than 2 seconds   Bilateral lower extremities overall well aligned she has pain with palpation of the bilateral knees.  She has both medial and lateral joint line tenderness.  Generalized synovitis is present bilaterally with small supraphysiologic effusions.  No additional findings.   ASSESSMENT & PLAN:   1. Chronic pain of both knees   2. Primary osteoarthritis of both knees    PLAN: Bilateral knee injections performed today with education on compression strengthening and avoidance activities.  We will plan to see her back in 8 weeks and can consider Visco supplementation and/or long-acting steroid (Zilretta).  No problem-specific Assessment & Plan notes found for this encounter.   ++++++++++++++++++++++++++++++++++++++++++++ Orders & Meds: Orders Placed This Encounter  Procedures  . DG Knee AP/LAT W/Sunrise Right  .  DG Knee 1-2 Views Left  . [place] MSK POCT ULTRASOUND    No orders of the defined types were placed in this encounter.   ++++++++++++++++++++++++++++++++++++++++++++ Follow-up: Return in about 8 weeks (around 11/20/2017).   Pertinent  documentation may be included in additional procedure notes, imaging studies, problem based documentation and patient instructions. Please see these sections of the encounter for additional information regarding this visit. CMA/ATC served as Neurosurgeon during this visit. History, Physical, and Plan performed by medical provider. Documentation and orders reviewed and attested to.      [name], DO    [place] [facility] Physician

## 2017-09-25 NOTE — Patient Instructions (Addendum)
You had an injection today.  Things to be aware of after injection are listed below: . You may experience no significant improvement or even a slight worsening in your symptoms during the first 24 to 48 hours.  After that we expect your symptoms to improve gradually over the next 2 weeks for the medicine to have its maximal effect.  You should continue to have improvement out to 6 weeks after your injection. . Dr. Berline Choughigby recommends icing the site of the injection for 20 minutes  1-2 times the day of your injection . You may shower but no swimming, tub bath or Jacuzzi for 24 hours. . If your bandage falls off this does not need to be replaced.  It is appropriate to remove the bandage after 4 hours. . You may resume light activities as tolerated unless otherwise directed per Dr. Berline Choughigby during your visit  POSSIBLE STEROID SIDE EFFECTS:  Side effects from injectable steroids tend to be less than when taken orally however you may experience some of the symptoms listed below.  If experienced these should only last for a short period of time. Change in menstrual flow  Edema (swelling)  Increased appetite Skin flushing (redness)  Skin rash/acne  Thrush (oral) Yeast vaginitis    Increased sweating  Depression Increased blood glucose levels Cramping and leg/calf  Euphoria (feeling happy)  POSSIBLE PROCEDURE SIDE EFFECTS: The side effects of the injection are usually fairly minimal however if you may experience some of the following side effects that are usually self-limited and will is off on their own.  If you are concerned please feel free to call the office with questions:  Increased numbness or tingling  Nausea or vomiting  Swelling or bruising at the injection site   Please call our office if if you experience any of the following symptoms over the next week as these can be signs of infection:   Fever greater than 100.65F  Significant swelling at the injection site  Significant redness or drainage  from the injection site  If after 2 weeks you are continuing to have worsening symptoms please call our office to discuss what the next appropriate actions should be including the potential for a return office visit or other diagnostic testing.   I recommend you obtained a compression sleeve to help with your joint problems. There are many options on the market however I recommend obtaining a B knee Body Helix compression sleeve.  You can find information (including how to appropriate measure yourself for sizing) can be found at [email or web address] [email or web address].  Many of these products are health savings account (HSA) eligible.   You can use the compression sleeve at any time throughout the day but is most important to use while being active as well as for 2 hours post-activity.   It is appropriate to ice following activity with the compression sleeve in place.   Please perform the exercise program that we have prepared for you and gone over in detail on a daily basis.  In addition to the handout you were provided you can access your program through: [email or web address]-exercise-[email or web address]   Your unique program code is: [email or web address]

## 2017-09-25 NOTE — Procedures (Signed)
PROCEDURE NOTE:  Ultrasound Guided: Injection: Bilateral knees Images were obtained and interpreted by myself, Gaspar BiddingMichael , DO  Images have been saved and stored to PACS system. Images obtained on: GE S7 Ultrasound machine  ULTRASOUND FINDINGS:  Small supraphysiologic effusion bilaterally with marked synovitis  DESCRIPTION OF PROCEDURE:  The patient's clinical condition is marked by substantial pain and/or significant functional disability. Other conservative therapy has not provided relief, is contraindicated, or not appropriate. There is a reasonable likelihood that injection will significantly improve the patient's pain and/or functional impairment.  After discussing the risks, benefits and expected outcomes of the injection and all questions were reviewed and answered, the patient wished to undergo the above named procedure. Verbal consent was obtained.  The ultrasound was used to identify the target structure and adjacent neurovascular structures. The skin was then prepped in sterile fashion and the target structure was injected under direct visualization using sterile technique as below:   Right knee: PREP: Alcohol, Ethel Chloride APPROACH: superiolateral, single injection, 25g 1.5in. INJECTATE: 2cc: 0.5% marcaine, 2cc: 40mg /mL DepoMedrol   ASPIRATE: None   DRESSING: Band-Aid   Left Knee PREP: Alcohol, Ethel Chloride APPROACH: superiolateral, single injection, 25g 1.5in. INJECTATE: 2cc: 0.5% marcaine, 2cc: 40mg /mL DepoMedrol   ASPIRATE: None   DRESSING: Band-Aid   Post procedural instructions including recommending icing and warning signs for infection were reviewed.   This procedure was well tolerated and there were no complications.     IMPRESSION: Succesful Ultrasound Guided: Injection

## 2017-10-17 ENCOUNTER — Ambulatory Visit: Payer: 59 | Admitting: Psychology

## 2017-10-24 DIAGNOSIS — Z01419 Encounter for gynecological examination (general) (routine) without abnormal findings: Secondary | ICD-10-CM | POA: Diagnosis not present

## 2017-10-24 LAB — HM MAMMOGRAPHY

## 2017-10-31 ENCOUNTER — Telehealth: Payer: Self-pay | Admitting: *Deleted

## 2017-10-31 NOTE — Telephone Encounter (Signed)
Spoke to pt asked her if she had her Flu shot? Pt said yes in November. Told her okay I will update your chart. Pt verbalized understanding.

## 2017-11-20 ENCOUNTER — Encounter: Payer: Self-pay | Admitting: Sports Medicine

## 2017-11-20 ENCOUNTER — Ambulatory Visit: Payer: 59 | Admitting: Sports Medicine

## 2017-11-20 VITALS — BP 114/76 | HR 91 | Ht 65.5 in | Wt 261.4 lb

## 2017-11-20 DIAGNOSIS — M25562 Pain in left knee: Secondary | ICD-10-CM | POA: Diagnosis not present

## 2017-11-20 DIAGNOSIS — M170 Bilateral primary osteoarthritis of knee: Secondary | ICD-10-CM

## 2017-11-20 DIAGNOSIS — G8929 Other chronic pain: Secondary | ICD-10-CM | POA: Diagnosis not present

## 2017-11-20 DIAGNOSIS — M25561 Pain in right knee: Secondary | ICD-10-CM

## 2017-11-20 DIAGNOSIS — M7051 Other bursitis of knee, right knee: Secondary | ICD-10-CM | POA: Diagnosis not present

## 2017-11-20 MED ORDER — DICLOFENAC SODIUM 2 % TD SOLN: 1.0000 "application " | Freq: Two times a day (BID) | TRANSDERMAL | 2 refills | Status: DC

## 2017-11-20 MED ORDER — DICLOFENAC SODIUM 2 % TD SOLN: 1.0000 "application " | Freq: Two times a day (BID) | TRANSDERMAL | 0 refills | Status: AC

## 2017-11-20 NOTE — Patient Instructions (Addendum)
[facility] instructions for Duexis, Pennsaid and Vimovo:  Your prescription will be filled through a mail order pharmacy.  It is typically [facility] but may vary depending on where you live.  You will receive a phone call from them which will typically come from a [phone] phone number.  You must speak directly to them to have this medication filled.  When the pharmacy calls, they will need your mailing address (for overnight shipment of the medication) andy they will need payment information if you have a copay (typically no more than $10). If you have not heard from them 2-3 days after your appointment with Dr. Rigby, contact us at the office ([phone]) or through MyChart so we can reach back out to the pharmacy.  

## 2017-11-20 NOTE — Progress Notes (Signed)
Aimee [name]. [name] Aimee [name] [phone]  Aimee [name] - 57 y.o. female MRN [id number]  Date of birth: [birth year]  Visit Date: 11/20/2017  PCP: Aimee Motto, PA   Referred by: Aimee [name], Aimee [name]  Scribe for today's visit: Aimee [name], LAT, ATC     SUBJECTIVE:  Aimee [name] is here for Follow-up (B knee pain) .   Notes from last OV on 09/25/17: Compared to the last office visit on 05/26/17, her previously described L ACJ and LBP symptoms are improving w/ no current s/s. Current symptoms are little to none & are nonradiating She has been taking IBU prn.  New issue: B knee pain.  Diagnosed w/ arthritis.  Prior arthroscopic knee sx on the R knee (menisectomy).  Pt reports no new injury and states that she takes IBU prn but states that she doesn't want to take meds.  Most recent x-rays about 2 yrs ago and [facility].  Knee pain worsened w/ prolonged standing, walking, sit-to-stand after prolonged sitting Alleviated w/ IBU. Therapies tried include: IBU prn   Other associated symptoms include: B knee swelling, popping/clicking.  N/T noted in the R LE to the R ankle.  11/20/17: Compared to the last office visit on 09/25/17, her previously described B knee symptoms are improving w/ significantly reduced pain.  She continues to have swelling in her R knee and lower leg.  Popping and clicking has decreased as well.  Pt states that she has resumed walking for exercise 3 days/week x 4 miles and states that she wears her Body Helix knee sleeves when she exercises. Current symptoms are mild & are radiating to the lower leg only on the R. She stopped taking her Duexis and is doing her HEP.  She had B knee XRs at her last visit and had B knee injections.  ROS Denies night time disturbances. Denies fevers, chills, or night sweats. Denies unexplained weight loss. Denies personal history of cancer. Reports changes in bowel or  bladder habits - hx of weak bladder per pt Denies recent unreported falls. Denies new or worsening dyspnea or wheezing. Denies headaches or dizziness.  Reports numbness, tingling or weakness  In the extremities - in the R LE Denies dizziness or presyncopal episodes Reports lower extremity edema - in the R knee and LE   HISTORY & PERTINENT PRIOR DATA:  Prior History reviewed and updated per electronic medical record.  Significant/pertinent history, findings, studies include:  reports that she has never smoked. She has never used smokeless tobacco. No results for input(s): HGBA1C, LABURIC, CREATINE in the last 8760 hours. No specialty comments available. No problems updated.  OBJECTIVE:  VS:  HT:5' 5.5" (166.4 cm)   WT:261 lb 6.4 oz (118.6 kg)  BMI:42.82    BP:114/76  HR:91bpm  TEMP: ( )  RESP:95 %   PHYSICAL EXAM: Constitutional: WDWN, Non-toxic appearing. Psychiatric: Alert & appropriately interactive.  Not depressed or anxious appearing. Respiratory: No increased work of breathing.  Trachea Midline Eyes: Pupils are equal.  EOM intact without nystagmus.  No scleral icterus  Vascular Exam: warm to touch trace pedal edema  lower extremity neuro exam: unremarkable normal strength normal sensation normal reflexes  MSK Exam: Knee with a slight valgus deformity.  She has tenderness along the medial joint line and along the pes bursa of the right knee.  She has mild crepitation with flexion and extension.   ASSESSMENT & PLAN:   1. Chronic pain of  both knees   2. Primary osteoarthritis of both knees   3. Pes anserinus bursitis of right knee     PLAN: Topical Pennsaid for pes bursa component and will plan to get her set up for bilateral Zilretta injections at follow-up.  Follow-up: Return in about 1 month (around 12/18/2017).      Please see additional documentation for Objective, Assessment and Plan sections. Pertinent additional documentation may be included in  corresponding procedure notes, imaging studies, problem based documentation and patient instructions. Please see these sections of the encounter for additional information regarding this visit.  CMA/ATC served as Neurosurgeonscribe during this visit. History, Physical, and Plan performed by medical provider. Documentation and orders reviewed and attested to.      Andrena MewsMichael D , DO    [place] Physician

## 2017-11-24 ENCOUNTER — Telehealth: Payer: Self-pay | Admitting: Sports Medicine

## 2017-11-24 NOTE — Telephone Encounter (Signed)
Called pt and LM regarding Zilretta injection.  Informed her that she can either schedule an appt next week to get the injection or that she can get the injection at her next scheduled appt which is on Dec 18, 2017.  Either way, I asked her to specify that she's wanting the Zilretta injection when she schedules that appt.

## 2017-11-24 NOTE — Telephone Encounter (Signed)
Copied from [place] [phone]. Topic: Quick Communication - Office Called Patient >> Nov 23, 2017 11:00 AM Jasper Loser, CMA wrote: Reason for CRM: 11/23/2017 - Zilretta injection- no PA required, covered at 100% with a $50 specialist copay. Once OOP max has been met copay will not apply. - BSC OK to advise pt  >> Nov 23, 2017 11:01 AM Jasper Loser, CMA wrote: Reason for CRM: 11/23/2017 - Zilretta injection- no PA required, covered at 100% with a $50 specialist copay. Once OOP max has been met copay will not apply. - BSC OK to advise pt >> Nov 24, 2017 10:40 AM Selinda Flavin B, NT wrote: Patient called and stated she had a missed call yesterday. I read off what the CRM entailed and she said that is great! Wanted to know when would be an available time to do this injection? Please advise. CB#: [phone]

## 2017-12-18 ENCOUNTER — Ambulatory Visit: Payer: 59 | Admitting: Sports Medicine

## 2018-01-01 ENCOUNTER — Ambulatory Visit: Payer: 59 | Admitting: Sports Medicine

## 2018-01-01 ENCOUNTER — Ambulatory Visit: Payer: Self-pay

## 2018-01-01 ENCOUNTER — Encounter: Payer: Self-pay | Admitting: Sports Medicine

## 2018-01-01 VITALS — BP 124/86 | HR 60 | Ht 65.5 in | Wt 256.6 lb

## 2018-01-01 DIAGNOSIS — M25561 Pain in right knee: Secondary | ICD-10-CM | POA: Diagnosis not present

## 2018-01-01 DIAGNOSIS — G8929 Other chronic pain: Secondary | ICD-10-CM

## 2018-01-01 DIAGNOSIS — M170 Bilateral primary osteoarthritis of knee: Secondary | ICD-10-CM

## 2018-01-01 DIAGNOSIS — M25562 Pain in left knee: Secondary | ICD-10-CM | POA: Diagnosis not present

## 2018-01-01 NOTE — Progress Notes (Signed)
Aimee [name]. [name], [facility] - 57 y.o. female MRN [id number]  Date of birth: 05/24/1961  Visit Date: 01/01/2018  PCP: [name], PA   Referred by: [name], [place]  Scribe for today's visit: Josepha Pigg, CMA     SUBJECTIVE:  Aimee [name] is here for Follow-up (bilateral knee pain)  Notes from last OV on 09/25/17: Compared to the last office visit on 05/26/17, her previously described L ACJ and LBP symptoms are improving w/ no current s/s. Current symptoms are little to none & are nonradiating She has been taking IBU prn.  New issue: B knee pain.  Diagnosed w/ arthritis.  Prior arthroscopic knee sx on the R knee (menisectomy).  Pt reports no new injury and states that she takes IBU prn but states that she doesn't want to take meds.  Most recent x-rays about 2 yrs ago and [facility].  Knee pain worsened w/ prolonged standing, walking, sit-to-stand after prolonged sitting Alleviated w/ IBU. Therapies tried include: IBU prn   Other associated symptoms include: B knee swelling, popping/clicking.  N/T noted in the R LE to the R ankle.  11/20/17: Compared to the last office visit on 09/25/17, her previously described B knee symptoms are improving w/ significantly reduced pain.  She continues to have swelling in her R knee and lower leg.  Popping and clicking has decreased as well.  Pt states that she has resumed walking for exercise 3 days/week x 4 miles and states that she wears her Body Helix knee sleeves when she exercises. Current symptoms are mild & are radiating to the lower leg only on the R. She stopped taking her Duexis and is doing her HEP.  She had B knee XRs at her last visit and had B knee injections.  01/01/2018: Compared to the last office visit, her previously described symptoms are worsening, knee pain flaring up, R>L.  Current symptoms are mild & are radiating to R  knee.  No current medications of modalities for pain/swelling.   ROS Denies night time disturbances. Denies fevers, chills, or night sweats. Denies unexplained weight loss. Denies personal history of cancer. Denies changes in bowel or bladder habits. Denies recent unreported falls. Denies new or worsening dyspnea or wheezing. Denies headaches or dizziness.  Denies numbness, tingling or weakness  In the extremities.  Denies dizziness or presyncopal episodes Reports lower extremity edema    HISTORY & PERTINENT PRIOR DATA:  Prior History reviewed and updated per electronic medical record.  Significant/pertinent history, findings, studies include:  reports that she has never smoked. She has never used smokeless tobacco. No results for input(s): HGBA1C, LABURIC, CREATINE in the last 8760 hours. 11/23/2017 - Zilretta - no PA required, covered at 100% with a $50 specialist copay. Once OOP max has been met copay will not apply. - [facility] No problems updated.  OBJECTIVE:  VS:  HT:5' 5.5" (166.4 cm)   WT:256 lb 9.6 oz (116.4 kg)  BMI:42.04    BP:124/86  HR:60(checked manually - irregular)bpm  TEMP: ( )  RESP:    PHYSICAL EXAM: Constitutional: WDWN, Non-toxic appearing. Psychiatric: Alert & appropriately interactive.  Not depressed or anxious appearing. Respiratory: No increased work of breathing.  Trachea Midline Eyes: Pupils are equal.  EOM intact without nystagmus.  No scleral icterus  Vascular Exam: warm to touch no edema  lower extremity neuro exam: unremarkable  MSK Exam: Bilateral knees with slight valgus deformity  she is ligamentously stable.  Full flexion extension.  Medial and lateral joint line pain.  She has a small effusion on the right with generalized synovitis and bogginess.  Trace effusion on the left.   ASSESSMENT & PLAN:   1. Chronic pain of both knees   2. Primary osteoarthritis of both knees     PLAN: Bilateral Zilretta injections performed today.  Work  excuse for being will wear tennis shoes to add additional cushioning provided.  Consider repeat injections in 12 weeks.  Follow-up: Return in about 3 months (around 03/26/2018).      Please see additional documentation for Objective, Assessment and Plan sections. Pertinent additional documentation may be included in corresponding procedure notes, imaging studies, problem based documentation and patient instructions. Please see these sections of the encounter for additional information regarding this visit.  CMA/ATC served as Education administrator during this visit. History, Physical, and Plan performed by medical provider. Documentation and orders reviewed and attested to.      Aimee [name], [facility] Physician

## 2018-01-01 NOTE — Patient Instructions (Signed)

## 2018-01-01 NOTE — Procedures (Signed)
PROCEDURE NOTE:  Ultrasound Guided: Injection: Bilateral knee, Zilretta Injections Images were obtained and interpreted by myself, Gaspar Bidding, DO  Images have been saved and stored to PACS system. Images obtained on: GE S7 Ultrasound machine    ULTRASOUND FINDINGS:  End stage degenerative changes on Right, moderate to severe on left. Small effusion with marked synoviits on right, trace effusion on left  DESCRIPTION OF PROCEDURE:  The patient's clinical condition is marked by substantial pain and/or significant functional disability. Other conservative therapy has not provided relief, is contraindicated, or not appropriate. There is a reasonable likelihood that injection will significantly improve the patient's pain and/or functional impairment.   After discussing the risks, benefits and expected outcomes of the injection and all questions were reviewed and answered, the patient wished to undergo the above named procedure.  Verbal consent was obtained.  The ultrasound was used to identify the target structure and adjacent neurovascular structures. The skin was then prepped in sterile fashion and the target structure was injected under direct visualization using sterile technique as below:  PREP: Alcohol and Ethel Chloride APPROACH: superiolateral, single injection, 21g 2 in. INJECTATE: 5 cc /5cc Zilretta Injections (long acting Triamcinolone) ASPIRATE: None DRESSING: Band-Aid  Post procedural instructions including recommending icing and warning signs for infection were reviewed.    This procedure was well tolerated and there were no complications.   IMPRESSION: Succesful Ultrasound Guided: Injection

## 2018-01-11 ENCOUNTER — Encounter: Payer: Self-pay | Admitting: Physician Assistant

## 2018-01-11 ENCOUNTER — Ambulatory Visit: Payer: 59 | Admitting: Physician Assistant

## 2018-01-11 VITALS — BP 122/84 | HR 78 | Temp 98.7°F | Ht 65.5 in | Wt 255.6 lb

## 2018-01-11 DIAGNOSIS — Z13220 Encounter for screening for lipoid disorders: Secondary | ICD-10-CM

## 2018-01-11 DIAGNOSIS — Z0001 Encounter for general adult medical examination with abnormal findings: Secondary | ICD-10-CM | POA: Diagnosis not present

## 2018-01-11 DIAGNOSIS — Z136 Encounter for screening for cardiovascular disorders: Secondary | ICD-10-CM | POA: Diagnosis not present

## 2018-01-11 DIAGNOSIS — I493 Ventricular premature depolarization: Secondary | ICD-10-CM | POA: Diagnosis not present

## 2018-01-11 DIAGNOSIS — I10 Essential (primary) hypertension: Secondary | ICD-10-CM | POA: Diagnosis not present

## 2018-01-11 DIAGNOSIS — Z114 Encounter for screening for human immunodeficiency virus [HIV]: Secondary | ICD-10-CM

## 2018-01-11 DIAGNOSIS — Z23 Encounter for immunization: Secondary | ICD-10-CM

## 2018-01-11 DIAGNOSIS — Z1159 Encounter for screening for other viral diseases: Secondary | ICD-10-CM | POA: Diagnosis not present

## 2018-01-11 DIAGNOSIS — E66813 Obesity, class 3: Secondary | ICD-10-CM

## 2018-01-11 LAB — LIPID PANEL
CHOL/HDL RATIO: 3
Cholesterol: 161 mg/dL (ref 0–200)
HDL: 59.6 mg/dL (ref >39.00)
LDL Cholesterol: 92 mg/dL (ref 0–99)
NONHDL: 101.76
Triglycerides: 51 mg/dL (ref 0.0–149.0)
VLDL: 10.2 mg/dL (ref 0.0–40.0)

## 2018-01-11 LAB — CBC WITH DIFFERENTIAL/PLATELET
BASOS ABS: 0 10*3/uL (ref 0.0–0.1)
Basophils Relative: 0.4 % (ref 0.0–3.0)
EOS ABS: 0.1 10*3/uL (ref 0.0–0.7)
Eosinophils Relative: 1 % (ref 0.0–5.0)
HEMATOCRIT: 38.7 % (ref 36.0–46.0)
Hemoglobin: 12.4 g/dL (ref 12.0–15.0)
LYMPHS PCT: 29 % (ref 12.0–46.0)
Lymphs Abs: 2.2 10*3/uL (ref 0.7–4.0)
MCHC: 32 g/dL (ref 30.0–36.0)
MCV: 79.8 fl (ref 78.0–100.0)
Monocytes Absolute: 0.6 10*3/uL (ref 0.1–1.0)
Monocytes Relative: 7.9 % (ref 3.0–12.0)
NEUTROS ABS: 4.7 10*3/uL (ref 1.4–7.7)
NEUTROS PCT: 61.7 % (ref 43.0–77.0)
PLATELETS: 326 10*3/uL (ref 150.0–400.0)
RBC: 4.85 Mil/uL (ref 3.87–5.11)
RDW: 15.5 % (ref 11.5–15.5)
WBC: 7.6 10*3/uL (ref 4.0–10.5)

## 2018-01-11 LAB — COMPREHENSIVE METABOLIC PANEL
ALT: 11 U/L (ref 0–35)
AST: 9 U/L (ref 0–37)
Albumin: 4.4 g/dL (ref 3.5–5.2)
Alkaline Phosphatase: 69 U/L (ref 39–117)
BILIRUBIN TOTAL: 0.6 mg/dL (ref 0.2–1.2)
BUN: 18 mg/dL (ref 6–23)
CALCIUM: 9.4 mg/dL (ref 8.4–10.5)
CO2: 29 meq/L (ref 19–32)
CREATININE: 0.84 mg/dL (ref 0.40–1.20)
Chloride: 101 mEq/L (ref 96–112)
GFR: 90.34 mL/min (ref >60.00)
Glucose, Bld: 84 mg/dL (ref 70–99)
Potassium: 4.1 mEq/L (ref 3.5–5.1)
Sodium: 139 mEq/L (ref 135–145)
Total Protein: 7.7 g/dL (ref 6.0–8.3)

## 2018-01-11 MED ORDER — AMLODIPINE BESYLATE 10 MG PO TABS: 10.0000 mg | ORAL_TABLET | Freq: Every day | ORAL | 1 refills | Status: DC

## 2018-01-11 NOTE — Patient Instructions (Signed)
It was great to see you!  Follow-up in 6 months for your blood pressure, sooner if any symptoms. We will be in touch with your labwork.  Health Maintenance, Female Adopting a healthy lifestyle and getting preventive care can go a long way to promote health and wellness. Talk with your health care provider about what schedule of regular examinations is right for you. This is a good chance for you to check in with your provider about disease prevention and staying healthy. In between checkups, there are plenty of things you can do on your own. Experts have done a lot of research about which lifestyle changes and preventive measures are most likely to keep you healthy. Ask your health care provider for more information. Weight and diet Eat a healthy diet  Be sure to include plenty of vegetables, fruits, low-fat dairy products, and lean protein.  Do not eat a lot of foods high in solid fats, added sugars, or salt.  Get regular exercise. This is one of the most important things you can do for your health. ? Most adults should exercise for at least 150 minutes each week. The exercise should increase your heart rate and make you sweat (moderate-intensity exercise). ? Most adults should also do strengthening exercises at least twice a week. This is in addition to the moderate-intensity exercise.  Maintain a healthy weight  Body mass index (BMI) is a measurement that can be used to identify possible weight problems. It estimates body fat based on height and weight. Your health care provider can help determine your BMI and help you achieve or maintain a healthy weight.  For females 74 years of age and older: ? A BMI below 18.5 is considered underweight. ? A BMI of 18.5 to 24.9 is normal. ? A BMI of 25 to 29.9 is considered overweight. ? A BMI of 30 and above is considered obese.  Watch levels of cholesterol and blood lipids  You should start having your blood tested for lipids and cholesterol at  56 years of age, then have this test every 5 years.  You may need to have your cholesterol levels checked more often if: ? Your lipid or cholesterol levels are high. ? You are older than 56 years of age. ? You are at high risk for heart disease.  Cancer screening Lung Cancer  Lung cancer screening is recommended for adults 32-25 years old who are at high risk for lung cancer because of a history of smoking.  A yearly low-dose CT scan of the lungs is recommended for people who: ? Currently smoke. ? Have quit within the past 15 years. ? Have at least a 30-pack-year history of smoking. A pack year is smoking an average of one pack of cigarettes a day for 1 year.  Yearly screening should continue until it has been 15 years since you quit.  Yearly screening should stop if you develop a health problem that would prevent you from having lung cancer treatment.  Breast Cancer  Practice breast self-awareness. This means understanding how your breasts normally appear and feel.  It also means doing regular breast self-exams. Let your health care provider know about any changes, no matter how small.  If you are in your 20s or 30s, you should have a clinical breast exam (CBE) by a health care provider every 1-3 years as part of a regular health exam.  If you are 77 or older, have a CBE every year. Also consider having a breast X-ray (mammogram)  every year.  If you have a family history of breast cancer, talk to your health care provider about genetic screening.  If you are at high risk for breast cancer, talk to your health care provider about having an MRI and a mammogram every year.  Breast cancer gene (BRCA) assessment is recommended for women who have family members with BRCA-related cancers. BRCA-related cancers include: ? Breast. ? Ovarian. ? Tubal. ? Peritoneal cancers.  Results of the assessment will determine the need for genetic counseling and BRCA1 and BRCA2 testing.  Cervical  Cancer Your health care provider may recommend that you be screened regularly for cancer of the pelvic organs (ovaries, uterus, and vagina). This screening involves a pelvic examination, including checking for microscopic changes to the surface of your cervix (Pap test). You may be encouraged to have this screening done every 3 years, beginning at age 45.  For women ages 53-65, health care providers may recommend pelvic exams and Pap testing every 3 years, or they may recommend the Pap and pelvic exam, combined with testing for human papilloma virus (HPV), every 5 years. Some types of HPV increase your risk of cervical cancer. Testing for HPV may also be done on women of any age with unclear Pap test results.  Other health care providers may not recommend any screening for nonpregnant women who are considered low risk for pelvic cancer and who do not have symptoms. Ask your health care provider if a screening pelvic exam is right for you.  If you have had past treatment for cervical cancer or a condition that could lead to cancer, you need Pap tests and screening for cancer for at least 20 years after your treatment. If Pap tests have been discontinued, your risk factors (such as having a new sexual partner) need to be reassessed to determine if screening should resume. Some women have medical problems that increase the chance of getting cervical cancer. In these cases, your health care provider may recommend more frequent screening and Pap tests.  Colorectal Cancer  This type of cancer can be detected and often prevented.  Routine colorectal cancer screening usually begins at 56 years of age and continues through 56 years of age.  Your health care provider may recommend screening at an earlier age if you have risk factors for colon cancer.  Your health care provider may also recommend using home test kits to check for hidden blood in the stool.  A small camera at the end of a tube can be used to  examine your colon directly (sigmoidoscopy or colonoscopy). This is done to check for the earliest forms of colorectal cancer.  Routine screening usually begins at age 74.  Direct examination of the colon should be repeated every 5-10 years through 56 years of age. However, you may need to be screened more often if early forms of precancerous polyps or small growths are found.  Skin Cancer  Check your skin from head to toe regularly.  Tell your health care provider about any new moles or changes in moles, especially if there is a change in a mole's shape or color.  Also tell your health care provider if you have a mole that is larger than the size of a pencil eraser.  Always use sunscreen. Apply sunscreen liberally and repeatedly throughout the day.  Protect yourself by wearing long sleeves, pants, a wide-brimmed hat, and sunglasses whenever you are outside.  Heart disease, diabetes, and high blood pressure  High blood pressure causes  heart disease and increases the risk of stroke. High blood pressure is more likely to develop in: ? People who have blood pressure in the high end of the normal range (130-139/85-89 mm Hg). ? People who are overweight or obese. ? People who are African American.  If you are 51-35 years of age, have your blood pressure checked every 3-5 years. If you are 45 years of age or older, have your blood pressure checked every year. You should have your blood pressure measured twice-once when you are at a hospital or clinic, and once when you are not at a hospital or clinic. Record the average of the two measurements. To check your blood pressure when you are not at a hospital or clinic, you can use: ? An automated blood pressure machine at a pharmacy. ? A home blood pressure monitor.  If you are between 108 years and 69 years old, ask your health care provider if you should take aspirin to prevent strokes.  Have regular diabetes screenings. This involves taking a  blood sample to check your fasting blood sugar level. ? If you are at a normal weight and have a low risk for diabetes, have this test once every three years after 56 years of age. ? If you are overweight and have a high risk for diabetes, consider being tested at a younger age or more often. Preventing infection Hepatitis B  If you have a higher risk for hepatitis B, you should be screened for this virus. You are considered at high risk for hepatitis B if: ? You were born in a country where hepatitis B is common. Ask your health care provider which countries are considered high risk. ? Your parents were born in a high-risk country, and you have not been immunized against hepatitis B (hepatitis B vaccine). ? You have HIV or AIDS. ? You use needles to inject street drugs. ? You live with someone who has hepatitis B. ? You have had sex with someone who has hepatitis B. ? You get hemodialysis treatment. ? You take certain medicines for conditions, including cancer, organ transplantation, and autoimmune conditions.  Hepatitis C  Blood testing is recommended for: ? Everyone born from [birth year] through 1964. ? Anyone with known risk factors for hepatitis C.  Sexually transmitted infections (STIs)  You should be screened for sexually transmitted infections (STIs) including gonorrhea and chlamydia if: ? You are sexually active and are younger than 56 years of age. ? You are older than 56 years of age and your health care provider tells you that you are at risk for this type of infection. ? Your sexual activity has changed since you were last screened and you are at an increased risk for chlamydia or gonorrhea. Ask your health care provider if you are at risk.  If you do not have HIV, but are at risk, it may be recommended that you take a prescription medicine daily to prevent HIV infection. This is called pre-exposure prophylaxis (PrEP). You are considered at risk if: ? You are sexually active and  do not regularly use condoms or know the HIV status of your partner(s). ? You take drugs by injection. ? You are sexually active with a partner who has HIV.  Talk with your health care provider about whether you are at high risk of being infected with HIV. If you choose to begin PrEP, you should first be tested for HIV. You should then be tested every 3 months for as long as  you are taking PrEP. Pregnancy  If you are premenopausal and you may become pregnant, ask your health care provider about preconception counseling.  If you may become pregnant, take 400 to 800 micrograms (mcg) of folic acid every day.  If you want to prevent pregnancy, talk to your health care provider about birth control (contraception). Osteoporosis and menopause  Osteoporosis is a disease in which the bones lose minerals and strength with aging. This can result in serious bone fractures. Your risk for osteoporosis can be identified using a bone density scan.  If you are 77 years of age or older, or if you are at risk for osteoporosis and fractures, ask your health care provider if you should be screened.  Ask your health care provider whether you should take a calcium or vitamin D supplement to lower your risk for osteoporosis.  Menopause may have certain physical symptoms and risks.  Hormone replacement therapy may reduce some of these symptoms and risks. Talk to your health care provider about whether hormone replacement therapy is right for you. Follow these instructions at home:  Schedule regular health, dental, and eye exams.  Stay current with your immunizations.  Do not use any tobacco products including cigarettes, chewing tobacco, or electronic cigarettes.  If you are pregnant, do not drink alcohol.  If you are breastfeeding, limit how much and how often you drink alcohol.  Limit alcohol intake to no more than 1 drink per day for nonpregnant women. One drink equals 12 ounces of beer, 5 ounces of  wine, or 1 ounces of hard liquor.  Do not use street drugs.  Do not share needles.  Ask your health care provider for help if you need support or information about quitting drugs.  Tell your health care provider if you often feel depressed.  Tell your health care provider if you have ever been abused or do not feel safe at home. This information is not intended to replace advice given to you by your health care provider. Make sure you discuss any questions you have with your health care provider. Document Released: 02/14/2011 Document Revised: 01/07/2016 Document Reviewed: 05/05/2015 Elsevier Interactive Patient Education  [employer].

## 2018-01-11 NOTE — Progress Notes (Addendum)
I acted as a Neurosurgeon for [employer], PA-C Corky Mull, LPN   Subjective:    Aimee [name] is a 57 y.o. female and is here for a comprehensive physical exam.   HPI  Health Maintenance Due  Topic Date Due  . Hepatitis C Screening  1960-09-25  . HIV Screening  06/12/1977  . TETANUS/TDAP  06/12/1981    Acute Concerns: None however, on physical exam patient was found to have possible irregular heartbeat.  She denies any symptoms including syncope, lightheadedness, dizziness, sensation of palpitations.  Chronic Issues: HTN -- Currently taking Norvasc 10 mg. At home blood pressure readings are: 115-150/70-100. Patient denies chest pain, SOB, blurred vision, dizziness, unusual headaches, lower leg swelling. Patient is compliant with medication. Denies excessive caffeine intake, stimulant usage, excessive alcohol intake, or increase in salt consumption. BP Readings from Last 3 Encounters:  01/11/18 122/84  01/01/18 124/86  11/20/17 114/76  Depression and Anxiety -- still having issues with job stress. Still seeing Dr. Sharl Ma -- saw last month. Still on Prozac 40 mg and Xanax 0.5 mg. Was also seeing Colen Darling, LCSW. Chronic knee pain -- seeing Dr. Berline Chough and getting knee injections and is now able to wear tennis shoes at work!  Health Maintenance: Immunizations -- UTD, will give Tdap today. Discussed Shingrix vaccine, pt will check with insurance and let us know. Colonoscopy -- UTD, due 02/2023 Mammogram -- UTD done April will have result sent to [place]. PAP -- none Hysterectomy Bone Density -- Done April 2019 will have results sent to [place]. Diet -- eat all food groups, drinks mostly water and juice Caffeine intake -- limited Sleep habits -- stress can impact her sleeping; does snore Exercise -- loves to walk when she can Weight -- Weight: 255 lb 9.6 oz (115.9 kg)  Mood -- anxiety and stress with work Weight history: Wt Readings from Last 10 Encounters:  01/11/18 255 lb 9.6 oz  (115.9 kg)  01/01/18 256 lb 9.6 oz (116.4 kg)  11/20/17 261 lb 6.4 oz (118.6 kg)  09/25/17 257 lb 6.4 oz (116.8 kg)  05/26/17 257 lb 12.8 oz (116.9 kg)  04/19/17 260 lb 4 oz (118 kg)  04/06/17 261 lb (118.4 kg)   No LMP recorded. Patient has had a hysterectomy. Period characteristics: no Alcohol use: occasional wine Tobacco use: no smoke  Depression screen PHQ 2/9 01/11/2018  Decreased Interest 0  Down, Depressed, Hopeless 1  PHQ - 2 Score 1  Altered sleeping -  Tired, decreased energy -  Change in appetite -  Feeling bad or failure about yourself  -  Trouble concentrating -  Moving slowly or fidgety/restless -  Suicidal thoughts -  PHQ-9 Score -  Difficult doing work/chores -    Other providers/specialists: Dr. Sharl Ma -- psychiatrist Dr. Berline Chough -- sports medicine   PMHx, SurgHx, SocialHx, Medications, and Allergies were reviewed in the Visit Navigator and updated as appropriate.   Past Medical History:  Diagnosis Date  . Anxiety   . Arthritis   . Depression   . GERD (gastroesophageal reflux disease)   . Hypertension      Past Surgical History:  Procedure Laterality Date  . ABDOMINAL HYSTERECTOMY  2004  . CESAREAN SECTION  2003  . KNEE ARTHROSCOPY Right 2013     Family History  Problem Relation Age of Onset  . Colon cancer Mother 1  . Heart disease Father   . Hypertension Father   . Heart attack Father 41  . Stroke Father   .  Diabetes Sister   . Heart attack Sister     Social History   Tobacco Use  . Smoking status: Never Smoker  . Smokeless tobacco: Never Used  Substance Use Topics  . Alcohol use: No  . Drug use: No    Review of Systems:   Review of Systems  Constitutional: Negative.  Negative for chills, fever, malaise/fatigue and weight loss.  HENT: Negative.  Negative for hearing loss, sinus pain and sore throat.   Eyes: Negative.  Negative for blurred vision.  Respiratory: Negative.  Negative for cough and shortness of breath.     Cardiovascular: Negative.  Negative for chest pain, palpitations and leg swelling.  Gastrointestinal: Negative.  Negative for abdominal pain, constipation, diarrhea, heartburn, nausea and vomiting.  Genitourinary: Negative.  Negative for dysuria, frequency and urgency.  Musculoskeletal: Negative.  Negative for back pain, myalgias and neck pain.  Skin: Negative.  Negative for itching and rash.  Neurological: Negative.  Negative for dizziness, tingling, seizures, loss of consciousness and headaches.  Endo/Heme/Allergies: Negative.  Negative for polydipsia.  Psychiatric/Behavioral: Negative.  Negative for depression. The patient is not nervous/anxious.     Objective:   BP 122/84 (BP Location: Left Arm, Patient Position: Sitting, Cuff Size: Large)   Pulse 78   Temp 98.7 F (37.1 C) (Oral)   Ht 5' 5.5" (1.664 m)   Wt 255 lb 9.6 oz (115.9 kg)   SpO2 96%   BMI 41.89 kg/m  Body mass index is 41.89 kg/m.   General Appearance:    Alert, cooperative, no distress, appears stated age  Head:    Normocephalic, without obvious abnormality, atraumatic  Eyes:    PERRL, conjunctiva/corneas clear, EOM's intact, fundi    benign, both eyes  Ears:    Normal TM's and external ear canals, both ears  Nose:   Nares normal, septum midline, mucosa normal, no drainage    or sinus tenderness  Throat:   Lips, mucosa, and tongue normal; teeth and gums normal  Neck:   Supple, symmetrical, trachea midline, no adenopathy;    thyroid:  no enlargement/tenderness/nodules; no carotid   bruit or JVD  Back:     Symmetric, no curvature, ROM normal, no CVA tenderness  Lungs:     Clear to auscultation bilaterally, respirations unlabored  Chest Wall:    No tenderness or deformity   Heart:    Occasional skipped beat, S1 and S2 normal, no murmur, rub   or gallop  Breast Exam:    Deferred  Abdomen:     Soft, non-tender, bowel sounds active all four quadrants,    no masses, no organomegaly  Genitalia:    Deferred   Extremities:   Extremities normal, atraumatic, no cyanosis or edema  Pulses:   2+ and symmetric all extremities  Skin:   Skin color, texture, turgor normal, no rashes or lesions  Lymph nodes:   Cervical, supraclavicular, and axillary nodes normal  Neurologic:   CNII-XII intact, normal strength, sensation and reflexes    throughout   EKG tracing is personally reviewed.  EKG notes frequent PVCs.  No acute changes.    Assessment/Plan:   Aimee [name] was seen today for annual exam.  Diagnoses and all orders for this visit:  Encounter for general adult medical examination with abnormal findings Today patient counseled on age appropriate routine health concerns for screening and prevention, each reviewed and up to date or declined. Immunizations reviewed and up to date or declined. Labs ordered and reviewed. Risk factors for depression reviewed  and negative. Hearing function and visual acuity are intact. ADLs screened and addressed as needed. Functional ability and level of safety reviewed and appropriate. Education, counseling and referrals performed based on assessed risks today. Patient provided with a copy of personalized plan for preventive services.  PVC (premature ventricular contraction) This was found on EKG today.  This is the first diagnosis of this for patient today.  I reviewed patient's EKG with Dr. Tana Conch in our office today.  Patient is without symptoms, we will not pursue any further work-up.  I did explain the patient that if she were to develop symptoms to let us know and we can pursue further work-up. -     EKG 12-Lead -     TSH  Encounter for lipid screening for cardiovascular disease -     Lipid panel  Hypertension, unspecified type Currently well controlled on Norvasc 10 mg.  We will continue this medication and also repeat a lipid panel today. -     CBC with Differential/Platelet -     Comprehensive metabolic panel  Screening for HIV (human immunodeficiency  virus) -     HIV antibody  Encounter for screening for other viral diseases -     Hepatitis C antibody  Need for prophylactic vaccination with combined diphtheria-tetanus-pertussis (DTP) vaccine -     Tdap vaccine greater than or equal to 7yo IM  Obesity Continue to work on diet and exercise.  Limit juices.  Other orders -     amLODipine (NORVASC) 10 MG tablet; Take 1 tablet (10 mg total) by mouth daily.     Well Adult Exam: Labs ordered: Yes. Patient counseling was done. See below for items discussed. Discussed the patient's BMI.  The BMI BMI is not in the acceptable range; BMI management plan is completed Follow up in 6 months. Breast cancer screening: UTD. Cervical cancer screening: UTD   Patient Counseling:    Nutrition: Stressed importance of moderation in sodium/caffeine intake, saturated fat and cholesterol, caloric balance, sufficient intake of fresh fruits, vegetables, fiber, calcium, iron, and 1 mg of folate supplement per day (for females capable of pregnancy).     Stressed the importance of regular exercise.      Substance Abuse: Discussed cessation/primary prevention of tobacco, alcohol, or other drug use; driving or other dangerous activities under the influence; availability of treatment for abuse.      Injury prevention: Discussed safety belts, safety helmets, smoke detector, smoking near bedding or upholstery.      Sexuality: Discussed sexually transmitted diseases, partner selection, use of condoms, avoidance of unintended pregnancy  and contraceptive alternatives.     Dental health: Discussed importance of regular tooth brushing, flossing, and dental visits.     Health maintenance and immunizations reviewed. Please refer to Health maintenance section.   CMA or LPN served as scribe during this visit. History, Physical, and Plan performed by medical provider. Documentation and orders reviewed and attested to.   [name], PA-C [employer] [facility]  [facility]

## 2018-01-12 LAB — TSH: TSH: 1.56 u[IU]/mL (ref 0.35–4.50)

## 2018-01-12 LAB — HEPATITIS C ANTIBODY
HEP C AB: NONREACTIVE
SIGNAL TO CUT-OFF: 0.01 (ref <1.00)

## 2018-01-12 LAB — HIV ANTIBODY (ROUTINE TESTING W REFLEX): HIV: NONREACTIVE

## 2018-04-03 ENCOUNTER — Ambulatory Visit: Payer: 59 | Admitting: Sports Medicine

## 2018-04-03 ENCOUNTER — Ambulatory Visit: Payer: Self-pay

## 2018-04-03 ENCOUNTER — Encounter: Payer: Self-pay | Admitting: Sports Medicine

## 2018-04-03 VITALS — BP 126/82 | HR 76 | Ht 65.0 in | Wt 262.0 lb

## 2018-04-03 DIAGNOSIS — M25461 Effusion, right knee: Secondary | ICD-10-CM | POA: Diagnosis not present

## 2018-04-03 DIAGNOSIS — M25462 Effusion, left knee: Secondary | ICD-10-CM

## 2018-04-03 DIAGNOSIS — M170 Bilateral primary osteoarthritis of knee: Secondary | ICD-10-CM | POA: Diagnosis not present

## 2018-04-03 NOTE — Procedures (Signed)
PROCEDURE NOTE:  Ultrasound Guided: Aspiration and Injection: Bilateral knee Images were obtained and interpreted by myself, Gaspar BiddingMichael , DO  Images have been saved and stored to PACS system. Images obtained on: GE S7 Ultrasound machine    ULTRASOUND FINDINGS:  Marked degenerative changes Large effusion bilaterally.  DESCRIPTION OF PROCEDURE:  The patient's clinical condition is marked by substantial pain and/or significant functional disability. Other conservative therapy has not provided relief, is contraindicated, or not appropriate. There is a reasonable likelihood that injection will significantly improve the patient's pain and/or functional impairment.   After discussing the risks, benefits and expected outcomes of the injection and all questions were reviewed and answered, the patient wished to undergo the above named procedure.  Verbal consent was obtained.  The ultrasound was used to identify the target structure and adjacent neurovascular structures. The skin was then prepped in sterile fashion and the target structure was injected under direct visualization using sterile technique as below:  Bilateral injections performed under identical technique as below: PREP: Alcohol, Ethel Chloride and 5 cc 1% lidocaine on 25g 1.5 in. needle APPROACH:superiolateral, stopcock technique, 21g 2 in. INJECTATE: 5cc Zilretta (32mg /585mL Sustained Release Triamcinolone) ASPIRATE: 25 cc on the right knee, 30 cc on the left  and straw colored  DRESSING: Band-Aid  Post procedural instructions including recommending icing and warning signs for infection were reviewed.    This procedure was well tolerated and there were no complications.   IMPRESSION: Succesful Ultrasound Guided: Aspiration and Injection

## 2018-04-03 NOTE — Progress Notes (Signed)
Aimee [name], [place] [facility] [place] - 57 y.o. female MRN [id number]  Date of birth: May 20, 1961  Visit Date: 04/03/2018  PCP: Aimee Coke, PA   Referred by: Aimee [name], [place]  Scribe(s) for today's visit: Aimee [name], LAT, ATC  SUBJECTIVE:  Aimee [name] is here for Follow-up (B knee pain) .    Notes from last OV on 09/25/17: Compared to the last office visit on 05/26/17, her previously described L ACJ and LBP symptoms are improving w/ no current s/s. Current symptoms are little to none & are nonradiating She has been taking IBU prn.  New issue: B knee pain.  Diagnosed w/ arthritis.  Prior arthroscopic knee sx on the R knee (menisectomy).  Pt reports no new injury and states that she takes IBU prn but states that she doesn't want to take meds.  Most recent x-rays about 2 yrs ago and [facility].  Knee pain worsened w/ prolonged standing, walking, sit-to-stand after prolonged sitting Alleviated w/ IBU. Therapies tried include: IBU prn   Other associated symptoms include: B knee swelling, popping/clicking.  N/T noted in the R LE to the R ankle.  11/20/17: Compared to the last office visit on 09/25/17, her previously described B knee symptoms are improving w/ significantly reduced pain.  She continues to have swelling in her R knee and lower leg.  Popping and clicking has decreased as well.  Pt states that she has resumed walking for exercise 3 days/week x 4 miles and states that she wears her Body Helix knee sleeves when she exercises. Current symptoms are mild & are radiating to the lower leg only on the R. She stopped taking her Duexis and is doing her HEP.  She had B knee XRs at her last visit and had B knee injections.  01/01/2018: Compared to the last office visit, her previously described symptoms are worsening, knee pain flaring up, R>L.  Current symptoms are mild & are radiating to R knee.   No current medications of modalities for pain/swelling.   04/03/2018: Compared to the last office visit on 01/01/18, her previously described B knee pain symptoms are worsening over the past month.  She states that the Zilretta injections really helped her for the first approximately 2 months to the point that she resumed walking 4 miles 2-3/week but her symptoms have returned. Current symptoms are severe & are radiating to B lower legs to the point that she feels like her lower legs are getting numb and feel like they want to cramp. She has been taking IBU, wearing her Body Helix knee sleeves w/ activity and doing her HEP.  She had B Zilretta injections on 01/01/18.   REVIEW OF SYSTEMS: Reports night time disturbances. Denies fevers, chills, or night sweats. Denies unexplained weight loss. Denies personal history of cancer. Denies changes in bowel or bladder habits. Denies recent unreported falls. Denies new or worsening dyspnea or wheezing. Denies headaches or dizziness.  Reports numbness, tingling or weakness  In the extremities - in R UE and B lower legs intermittently Denies dizziness or presyncopal episodes Reports lower extremity edema     HISTORY & PERTINENT PRIOR DATA:  Significant/pertinent history, findings, studies include:  reports that she has never smoked. She has never used smokeless tobacco. No results for input(s): HGBA1C, LABURIC, CREATINE in the last 8760 hours. 11/23/2017 - Zilretta - no PA required, covered at 100% with a $50 specialist copay. Once  OOP max has been met copay will not apply. - BSC No problems updated.  Otherwise prior history reviewed and updated per electronic medical record.    OBJECTIVE:  VS:  HT:'5\' 5"'$  (165.1 cm)   WT:262 lb (118.8 kg)  BMI:43.6    BP:126/82  HR:76bpm  TEMP: ( )  RESP:96 %   PHYSICAL EXAM: CONSTITUTIONAL: Well-developed, Well-nourished and In no acute distress Alert & appropriately interactive. and Not depressed or  anxious appearing. RESPIRATORY: No increased work of breathing and Trachea Midline EYES: Pupils are equal., EOM intact without nystagmus. and No scleral icterus.  Lower extremities: Warm and well perfused Edema: 1+ pitting edema pretibial bilaterally Calf supple with no pain with squeeze (Negative Homans) NEURO: unremarkable  MSK Exam:  Bilateral Knee  Alignment & Contours: mild valgus Skin: No overlying erythema/ecchymosis Effusion: Small right, minimal on the left. Generalized Synovitis: mild Knee Tenderness: Medial joint line and Lateral joint line Gait: antalgic Patellar grind produces: Mild pain   RANGE OF MOTION & STRENGTH  EXTENSION: 0  with mild pain.   Strength: Normal    LIGAMENTOUS TESTING  Varus & Valgus Strain: 2 to 3 mm of opening with varus and valgus stressing. Anterior & Posterior Drawer: stable to testing    SPECIALITY TESTING:    Mcmurray's: positive, mild pain Thessaly: normal, no pain  :   PROCEDURES & DATA REVIEWED:  US Guided Injection per procedure note  ASSESSMENT   1. Primary osteoarthritis of both knees   2. Effusion of both knee joints     PLAN:          We will go ahead and perform ultrasound-guided injection per procedure note.  Bilateral Zilretta injections performed again today.  She had remarkable improvement in her symptoms for almost 10 weeks before this will begin to wear off after being very active over vacation to [facility].  No problem-specific Assessment & Plan notes found for this encounter.  Follow-up: Return in about 13 weeks (around 07/03/2018) for consideration of repeat injections.      Please see additional documentation for Objective, Assessment and Plan sections. Pertinent additional documentation may be included in corresponding procedure notes, imaging studies, problem based documentation and patient instructions. Please see these sections of the encounter for additional information regarding this  visit.  CMA/ATC served as Education administrator during this visit. History, Physical, and Plan performed by medical provider. Documentation and orders reviewed and attested to.      Aimee [name], [facility] Physician

## 2018-04-03 NOTE — Patient Instructions (Signed)

## 2018-04-04 ENCOUNTER — Encounter: Payer: Self-pay | Admitting: Sports Medicine

## 2018-06-07 ENCOUNTER — Telehealth: Payer: Self-pay | Admitting: Physician Assistant

## 2018-06-07 NOTE — Telephone Encounter (Signed)
Left detailed message informing Aimee [name](pt husband) of update. Form is ready for pick up.

## 2018-06-07 NOTE — Telephone Encounter (Signed)
See note  Copied from CRM [id number]. Topic: General - Inquiry >> Jun 07, 2018  9:31 AM Mickel Baas B, NT wrote: Reason for CRM: Patient's husband calling and states that he has misplaced the handicap placard forms and would like to know if the office has a copy or if Dr Berline Chough could fill out another form? Please advise. Would like a call regarding this.  CB#: [phone]

## 2018-07-03 ENCOUNTER — Ambulatory Visit: Payer: 59 | Admitting: Sports Medicine

## 2018-07-09 ENCOUNTER — Ambulatory Visit: Payer: 59 | Admitting: Sports Medicine

## 2018-07-09 ENCOUNTER — Ambulatory Visit: Payer: Self-pay

## 2018-07-09 ENCOUNTER — Encounter: Payer: Self-pay | Admitting: Sports Medicine

## 2018-07-09 VITALS — BP 122/84 | HR 74 | Ht 65.0 in | Wt 265.2 lb

## 2018-07-09 DIAGNOSIS — M170 Bilateral primary osteoarthritis of knee: Secondary | ICD-10-CM

## 2018-07-09 DIAGNOSIS — M545 Low back pain, unspecified: Secondary | ICD-10-CM

## 2018-07-09 NOTE — Assessment & Plan Note (Signed)
Long standing low back pain.  Encouraged to try to increase activity slowly [employer] videos If any lack of improvement can consider further eval

## 2018-07-09 NOTE — Progress Notes (Signed)
PROCEDURE NOTE:  Ultrasound Guided: Injection: Bilateral knee Images were obtained and interpreted by myself, Gaspar BiddingMichael , DO  Images have been saved and stored to PACS system. Images obtained on: GE S7 Ultrasound machine    ULTRASOUND FINDINGS:  Moderate synovitis bilaterally with supraphysiologic effusions bilaterally.  DESCRIPTION OF PROCEDURE:  The patient's clinical condition is marked by substantial pain and/or significant functional disability. Other conservative therapy has not provided relief, is contraindicated, or not appropriate. There is a reasonable likelihood that injection will significantly improve the patient's pain and/or functional impairment.   After discussing the risks, benefits and expected outcomes of the injection and all questions were reviewed and answered, the patient wished to undergo the above named procedure.  Verbal consent was obtained.  The ultrasound was used to identify the target structure and adjacent neurovascular structures. The skin was then prepped in sterile fashion and the target structure was injected under direct visualization using sterile technique as below:  Bilateral injections performed under identical technique as below: PREP: Alcohol and Ethel Chloride APPROACH:superiolateral, single injection, 21g 2 in. INJECTATE: 2 cc 0.5% Marcaine and 1 cc 40mg /mL DepoMedrol ASPIRATE: None DRESSING: Band-Aid  Post procedural instructions including recommending icing and warning signs for infection were reviewed.    This procedure was well tolerated and there were no complications.   IMPRESSION: Succesful Ultrasound Guided: Injection

## 2018-07-09 NOTE — Patient Instructions (Addendum)
You had an injection today.  Things to be aware of after injection are listed below: . You may experience no significant improvement or even a slight worsening in your symptoms during the first 24 to 48 hours.  After that we expect your symptoms to improve gradually over the next 2 weeks for the medicine to have its maximal effect.  You should continue to have improvement out to 6 weeks after your injection. . Dr. Rigby recommends icing the site of the injection for 20 minutes  1-2 times the day of your injection . You may shower but no swimming, tub bath or Jacuzzi for 24 hours. . If your bandage falls off this does not need to be replaced.  It is appropriate to remove the bandage after 4 hours. . You may resume light activities as tolerated unless otherwise directed per Dr. Rigby during your visit  POSSIBLE STEROID SIDE EFFECTS:  Side effects from injectable steroids tend to be less than when taken orally however you may experience some of the symptoms listed below.  If experienced these should only last for a short period of time. Change in menstrual flow  Edema (swelling)  Increased appetite Skin flushing (redness)  Skin rash/acne  Thrush (oral) Yeast vaginitis    Increased sweating  Depression Increased blood glucose levels Cramping and leg/calf  Euphoria (feeling happy)  POSSIBLE PROCEDURE SIDE EFFECTS: The side effects of the injection are usually fairly minimal however if you may experience some of the following side effects that are usually self-limited and will is off on their own.  If you are concerned please feel free to call the office with questions:  Increased numbness or tingling  Nausea or vomiting  Swelling or bruising at the injection site   Please call our office if if you experience any of the following symptoms over the next week as these can be signs of infection:   Fever greater than 100.5F  Significant swelling at the injection site  Significant redness or drainage  from the injection site  If after 2 weeks you are continuing to have worsening symptoms please call our office to discuss what the next appropriate actions should be including the potential for a return office visit or other diagnostic testing.    Also check out "Foundation Training" which is a program developed by Dr. Eric Goodman.   There are links to a couple of his YouTube Videos below and I would like to see performing one of his videos 5-6 days per week.    A good intro video is: "Independence from Pain 7-minute Video" - https://www.youtube.com/watch?[email or web address]   Exercises that focus more on the neck are as below: Dr. Goodman with [employer] teaching neck and shoulder details Part 1 - https://youtu.be/[email or web address] Part 2 Dr. Goodman with [employer] quick routine to practice daily - https://youtu.be/[email or web address]  Do not try to attempt the entire video when first beginning.    Try breaking of each exercise that he goes into shorter segments.  Otherwise if they perform an exercise for 45 seconds, start with 15 seconds and rest and then resume when they begin the new activity.  If you work your way up to being able to do these videos without having to stop, I expect you will see significant improvements in your pain.  If you enjoy his videos and would like to find out more you can look on his website: [email or web address].  He has a workout streaming option as well as a DVD set   available for purchase.  Amazon has the best price for his DVDs.      

## 2018-07-09 NOTE — Progress Notes (Signed)
Aimee [name]. , [facility] [place] - 57 y.o. female MRN [id number]  Date of birth: 1961/01/10  Visit Date: 07/09/2018  PCP: Aimee Coke, PA   Referred by: Aimee [name], [place]  Scribe(s) for today's visit: Aimee [name], CMA  SUBJECTIVE:  Aimee [name] is here for Follow-up (osteoarthritis B knees) .    HPI:  09/25/17: Compared to the last office visit on 05/26/17, her previously described L ACJ and LBP symptoms are improving w/ no current s/s. Current symptoms are little to none & are nonradiating She has been taking IBU prn. New issue: B knee pain.  Diagnosed w/ arthritis.  Prior arthroscopic knee sx on the R knee (menisectomy).  Pt reports no new injury and states that she takes IBU prn but states that she doesn't want to take meds.  Most recent x-rays about 2 yrs ago and [facility]. Knee pain worsened w/ prolonged standing, walking, sit-to-stand after prolonged sitting Alleviated w/ IBU. Therapies tried include: IBU prn  Other associated symptoms include: B knee swelling, popping/clicking.  N/T noted in the R LE to the R ankle.  11/20/17: Compared to the last office visit on 09/25/17, her previously described B knee symptoms are improving w/ significantly reduced pain.  She continues to have swelling in her R knee and lower leg.  Popping and clicking has decreased as well.  Pt states that she has resumed walking for exercise 3 days/week x 4 miles and states that she wears her Body Helix knee sleeves when she exercises. Current symptoms are mild & are radiating to the lower leg only on the R. She stopped taking her Duexis and is doing her HEP.  She had B knee XRs at her last visit and had B knee injections.  01/01/2018: Compared to the last office visit, her previously described symptoms are worsening, knee pain flaring up, R>L.  Current symptoms are mild & are radiating to R knee.  No  current medications of modalities for pain/swelling.   04/03/2018: Compared to the last office visit on 01/01/18, her previously described B knee pain symptoms are worsening over the past month.  She states that the Zilretta injections really helped her for the first approximately 2 months to the point that she resumed walking 4 miles 2-3/week but her symptoms have returned. Current symptoms are severe & are radiating to B lower legs to the point that she feels like her lower legs are getting numb and feel like they want to cramp. She has been taking IBU, wearing her Body Helix knee sleeves w/ activity and doing her HEP.  She had B Zilretta injections on 01/01/18.  07/09/2018: Compared to the last office visit, her previously described symptoms are worsening. She c/o flare-up with cold weather. She c/o throbbing sensation when knees are bent.  She c/o "strong pains" on the medial aspect of the knee. She is unsure if there is swelling present.  Current symptoms are mild & are radiating to the L lower leg, worse at night. At times she will have radiating pain into the R leg too.  She has been wearing Body Helix when being more active. She has tried using topical Diclofenac and doesn't feel like it has been beneficial and it has irritated her skin. She has been taking IBU with some relief at bedtime. She has been doing HEP with no trouble but she feels this causes her legs to ache.  She received  Zilretta at her last visit, it worked "ok" but she doesn't feel like it worked any better than previous injections.    REVIEW OF SYSTEMS: Reports night time disturbances. Denies fevers, chills, or night sweats. Denies unexplained weight loss. Denies personal history of cancer. Denies changes in bowel or bladder habits. Denies recent unreported falls. Denies new or worsening dyspnea or wheezing. Denies headaches or dizziness.  Reports numbness, tingling or weakness  In the extremities - in R UE and B lower  legs intermittently Denies dizziness or presyncopal episodes Denies lower extremity edema     HISTORY & PERTINENT PRIOR DATA:  Significant/pertinent history, findings, studies include:  reports that she has never smoked. She has never used smokeless tobacco. No results for input(s): HGBA1C, LABURIC, CREATINE in the last 8760 hours. 11/23/2017 - Zilretta - no PA required, covered at 100% with a $50 specialist copay. Once OOP max has been met copay will not apply. - BSC Problem  Acute Left-Sided Low Back Pain Without Sciatica    Otherwise prior history reviewed and updated per electronic medical record.   09/25/17:  Advanced tricompartmental osteoarthritic changes in both knees with associated small joint effusions.  OBJECTIVE:  VS:  HT:'5\' 5"'$  (165.1 cm)   WT:265 lb 3.2 oz (120.3 kg)  BMI:44.13    BP:122/84  HR:74bpm  TEMP: ( )  RESP:96 %   PHYSICAL EXAM: CONSTITUTIONAL: Well-developed, Well-nourished and In no acute distress Psychiatric: Alert & appropriately interactive. and Not depressed or anxious appearing. RESPIRATORY: No increased work of breathing and Trachea Midline EYES: Pupils are equal., EOM intact without nystagmus. and No scleral icterus.  Lower extremities: EXTREMITY EXAM: Warm and well perfused Edema: 1+ pitting edema pretibial bilaterally Calf supple with no pain with squeeze (Negative Homans) NEURO: unremarkable  MSK Exam:  Bilateral Knee  Alignment & Contours: normal Skin: No overlying erythema/ecchymosis Effusion: small, bilaterally   Generalized Synovitis: moderate Knee Tenderness: Medial joint line and Lateral joint line Gait:antalgic Patellar grind produces: Mild pain and Mild crepitation   RANGE OF MOTION & STRENGTH  EXTENSION: Normal  with no pain.   Strength: Normal   LIGAMENTOUS TESTING  Varus & Valgus Strain: stable to testing and 2 - 3 mm of opening bilaterally per Anterior & Posterior Drawer: stable to testing   SPECIALITY TESTING:    Mcmurray's: positive, moderate pain Thessaly: Deferred due to pain    ASSESSMENT   1. Primary osteoarthritis of both knees   2. Acute left-sided low back pain without sciatica     PLAN:  Pertinent additional documentation may be included in corresponding procedure notes, imaging studies, problem based documentation and patient instructions.  Procedures:  . US Guided Injection per procedure note  Medications:  No orders of the defined types were placed in this encounter.  Discussion/Instructions: Acute left-sided low back pain without sciatica Long standing low back pain.  Encouraged to try to increase activity slowly [employer] If any lack of improvement can consider further eval   . Only minimal improvement with Zilretta injections.  We will repeat standard Depo-Medrol injections today.  Could consider Visco supplementation but discussed total knee arthroplasty may be required to provide her the amount of relief that she is looking for.  Okay to continue with injections at this time . Discussed red flag symptoms that warrant earlier emergent evaluation and patient voices understanding. . Activity modifications and the importance of avoiding exacerbating activities (limiting pain to no more than a 4 / 10 during or following activity) recommended and  discussed.  Follow-up:  . Return in about 10 weeks (around 09/17/2018).  . If any lack of improvement: consider referral to Orthopedics for TKR . At follow up will plan: to consider repeat corticosteroid injections    CMA/ATC served as scribe during this visit. History, Physical, and Plan performed by medical provider. Documentation and orders reviewed and attested to.      [name], [facility] Physician

## 2018-07-16 ENCOUNTER — Encounter: Payer: Self-pay | Admitting: Physician Assistant

## 2018-07-16 ENCOUNTER — Ambulatory Visit: Payer: 59 | Admitting: Physician Assistant

## 2018-07-16 VITALS — BP 140/90 | HR 78 | Temp 99.1°F | Ht 65.0 in | Wt 268.0 lb

## 2018-07-16 DIAGNOSIS — R5383 Other fatigue: Secondary | ICD-10-CM

## 2018-07-16 DIAGNOSIS — N644 Mastodynia: Secondary | ICD-10-CM

## 2018-07-16 DIAGNOSIS — F321 Major depressive disorder, single episode, moderate: Secondary | ICD-10-CM | POA: Diagnosis not present

## 2018-07-16 DIAGNOSIS — E559 Vitamin D deficiency, unspecified: Secondary | ICD-10-CM

## 2018-07-16 DIAGNOSIS — I10 Essential (primary) hypertension: Secondary | ICD-10-CM

## 2018-07-16 LAB — COMPREHENSIVE METABOLIC PANEL
ALBUMIN: 4.2 g/dL (ref 3.5–5.2)
ALT: 18 U/L (ref 0–35)
AST: 12 U/L (ref 0–37)
Alkaline Phosphatase: 76 U/L (ref 39–117)
BUN: 12 mg/dL (ref 6–23)
CALCIUM: 9.3 mg/dL (ref 8.4–10.5)
CHLORIDE: 103 meq/L (ref 96–112)
CO2: 30 mEq/L (ref 19–32)
CREATININE: 0.77 mg/dL (ref 0.40–1.20)
GFR: 99.69 mL/min (ref >60.00)
Glucose, Bld: 109 mg/dL — ABNORMAL HIGH (ref 70–99)
POTASSIUM: 4.2 meq/L (ref 3.5–5.1)
Sodium: 141 mEq/L (ref 135–145)
Total Bilirubin: 0.4 mg/dL (ref 0.2–1.2)
Total Protein: 7.1 g/dL (ref 6.0–8.3)

## 2018-07-16 LAB — CBC WITH DIFFERENTIAL/PLATELET
BASOS PCT: 0.7 % (ref 0.0–3.0)
Basophils Absolute: 0 10*3/uL (ref 0.0–0.1)
Eosinophils Absolute: 0.1 10*3/uL (ref 0.0–0.7)
Eosinophils Relative: 1.7 % (ref 0.0–5.0)
HEMATOCRIT: 37.6 % (ref 36.0–46.0)
HEMOGLOBIN: 12.1 g/dL (ref 12.0–15.0)
Lymphocytes Relative: 31.9 % (ref 12.0–46.0)
Lymphs Abs: 1.9 10*3/uL (ref 0.7–4.0)
MCHC: 32.1 g/dL (ref 30.0–36.0)
MCV: 78.9 fl (ref 78.0–100.0)
Monocytes Absolute: 0.6 10*3/uL (ref 0.1–1.0)
Monocytes Relative: 9.5 % (ref 3.0–12.0)
Neutro Abs: 3.3 10*3/uL (ref 1.4–7.7)
Neutrophils Relative %: 56.2 % (ref 43.0–77.0)
Platelets: 284 10*3/uL (ref 150.0–400.0)
RBC: 4.76 Mil/uL (ref 3.87–5.11)
RDW: 16.8 % — AB (ref 11.5–15.5)
WBC: 5.8 10*3/uL (ref 4.0–10.5)

## 2018-07-16 LAB — VITAMIN B12: VITAMIN B 12: 262 pg/mL (ref 211–911)

## 2018-07-16 LAB — TSH: TSH: 1.11 u[IU]/mL (ref 0.35–4.50)

## 2018-07-16 LAB — VITAMIN D 25 HYDROXY (VIT D DEFICIENCY, FRACTURES): VITD: 20 ng/mL — ABNORMAL LOW (ref 30.00–100.00)

## 2018-07-16 MED ORDER — BUPROPION HCL ER (XL) 150 MG PO TB24: 150.0000 mg | ORAL_TABLET | Freq: Every day | ORAL | 1 refills | Status: DC

## 2018-07-16 MED ORDER — AMLODIPINE BESYLATE 10 MG PO TABS: 10.0000 mg | ORAL_TABLET | Freq: Every day | ORAL | 1 refills | Status: DC

## 2018-07-16 NOTE — Progress Notes (Signed)
Aimee [name] is a 57 y.o. female is here to discuss: Hypertension  I acted as a Neurosurgeon for [employer], PA-C Corky Mull, LPN  History of Present Illness:   Chief Complaint  Patient presents with  . Hypertension    Hypertension  This is a chronic problem. The current episode started more than 1 year ago (Pt here today for 6 month follow up on blood pressure. Pt has been checking blood pressure at home every other day BP ranging 122/84. ). The problem has been gradually improving since onset. The problem is controlled. Associated symptoms include malaise/fatigue. Pertinent negatives include no blurred vision, chest pain, headaches, palpitations, peripheral edema or shortness of breath. Risk factors for coronary artery disease include obesity and post-menopausal state. The current treatment provides moderate improvement. Compliance problems: None, pt tolerating medication.    Depression/Anxiety Patient reports that over the past 2 to 3 months she has had worsening depression and anxiety.  She does not know why this is.  Her depression is significantly worse in her anxiety.  She denies suicidal thoughts.  She is currently on Prozac for several months.  She does see a psychiatrist, Dr. Evelene Croon, and only sees her approximately once a year.  She has been taking her Xanax every night for sleep.  She states that she has been eating more, has more fatigue, decreased motivation or get out of bed.  She states that her husband is concerned about her mental health.  She has not called her psychiatrist to be re-evaluated.  Denies current suicidal ideation.  Breast pain Patient endorses left upper breast pain.  She states that she had this since her mammogram.  Her mammogram was normal.  Her sister has breast cancer.  She states that she has health anxiety about developing breast cancer.  [facility]  She denies any nipple changes, dimpling, skin changes.  Wt Readings  from Last 3 Encounters:  07/16/18 268 lb (121.6 kg)  07/09/18 265 lb 3.2 oz (120.3 kg)  04/03/18 262 lb (118.8 kg)   Depression screen [facility] 2/9 07/16/2018 01/11/2018 04/06/2017  Decreased Interest 3 0 0  Down, Depressed, Hopeless 3 1 1   PHQ - 2 Score 6 1 1   Altered sleeping 3 - 2  Tired, decreased energy 3 - 3  Change in appetite 3 - 1  Feeling bad or failure about yourself  3 - 1  Trouble concentrating 3 - 0  Moving slowly or fidgety/restless 2 - 0  Suicidal thoughts 1 - 0  PHQ-9 Score 24 - 8  Difficult doing work/chores Very difficult - Somewhat difficult     Health Maintenance Due  Topic Date Due  . MAMMOGRAM  05/18/2018    Past Medical History:  Diagnosis Date  . Anxiety   . Arthritis   . Depression   . GERD (gastroesophageal reflux disease)   . Hypertension      Social History   Socioeconomic History  . Marital status: Married    Spouse name: Not on file  . Number of children: Not on file  . Years of education: Not on file  . Highest education level: Not on file  Occupational History  . Not on file  Social Needs  . Financial resource strain: Not on file  . Food insecurity:    Worry: Not on file    Inability: Not on file  . Transportation needs:    Medical: Not on file    Non-medical: Not on  file  Tobacco Use  . Smoking status: Never Smoker  . Smokeless tobacco: Never Used  Substance and Sexual Activity  . Alcohol use: No  . Drug use: No  . Sexual activity: Yes    Birth control/protection: Surgical  Lifestyle  . Physical activity:    Days per week: Not on file    Minutes per session: Not on file  . Stress: Not on file  Relationships  . Social connections:    Talks on phone: Not on file    Gets together: Not on file    Attends religious service: Not on file    Active member of club or organization: Not on file    Attends meetings of clubs or organizations: Not on file    Relationship status: Not on file  . Intimate partner violence:    Fear of  current or ex partner: Not on file    Emotionally abused: Not on file    Physically abused: Not on file    Forced sexual activity: Not on file  Other Topics Concern  . Not on file  Social History Narrative   Goes by [employer]   Works for [employer] -- working there for 24 years   Married, 2 kids (34 and 15) and 1 grandson    Past Surgical History:  Procedure Laterality Date  . ABDOMINAL HYSTERECTOMY  2004  . CESAREAN SECTION  2003  . KNEE ARTHROSCOPY Right 2013    Family History  Problem Relation Age of Onset  . Colon cancer Mother 25  . Heart disease Father   . Hypertension Father   . Heart attack Father 36  . Stroke Father   . Diabetes Sister   . Heart attack Sister     PMHx, SurgHx, SocialHx, FamHx, Medications, and Allergies were reviewed in the Visit Navigator and updated as appropriate.   Patient Active Problem List   Diagnosis Date Noted  . PVC (premature ventricular contraction) 01/11/2018  . Knee osteoarthritis 09/25/2017  . AC separation, left, initial encounter 05/26/2017  . Acute left-sided low back pain without sciatica 05/26/2017  . Hypertension 04/06/2017  . Anxiety 04/06/2017  . Arthritis 04/06/2017    Social History   Tobacco Use  . Smoking status: Never Smoker  . Smokeless tobacco: Never Used  Substance Use Topics  . Alcohol use: No  . Drug use: No    Current Medications and Allergies:    Current Outpatient Medications:  .  ALPRAZolam (XANAX) 0.5 MG tablet, Take 0.5 mg by mouth 4 (four) times daily as needed. , Disp: , Rfl:  .  amLODipine (NORVASC) 10 MG tablet, Take 1 tablet (10 mg total) by mouth daily., Disp: 90 tablet, Rfl: 1 .  Cholecalciferol (VITAMIN D3) 1000 units CAPS, Take 1 capsule by mouth daily., Disp: , Rfl:  .  Diclofenac Sodium (PENNSAID) 2 % SOLN, Place 1 application onto the skin 2 (two) times daily., Disp: 112 g, Rfl: 2 .  FLUoxetine (PROZAC) 40 MG capsule, Take 40 mg by mouth daily. , Disp: , Rfl:  .  fluticasone  (FLONASE) 50 MCG/ACT nasal spray, Place 2 sprays into both nostrils daily., Disp: 16 g, Rfl: 2 .  ibuprofen (ADVIL) 200 MG tablet, Take 400 mg by mouth as needed., Disp: , Rfl:  .  montelukast (SINGULAIR) 10 MG tablet, Take 10 mg by mouth at bedtime., Disp: , Rfl:  .  Multiple Vitamin (MULTIVITAMIN) tablet, Take 1 tablet by mouth daily., Disp: , Rfl:  .  PREVIDENT  5000 SENSITIVE 1.1-5 % PSTE, See admin instructions., Disp: , Rfl: 5 .  buPROPion (WELLBUTRIN XL) 150 MG 24 hr tablet, Take 1 tablet (150 mg total) by mouth daily. Start 150 mg ER PO qam., Disp: 30 tablet, Rfl: 1  No Known Allergies  Review of Systems   Review of Systems  Constitutional: Positive for malaise/fatigue.  Eyes: Negative for blurred vision.  Respiratory: Negative for shortness of breath.   Cardiovascular: Negative for chest pain and palpitations.  Neurological: Negative for headaches.    Vitals:   Vitals:   07/16/18 0842 07/16/18 0907  BP: 120/88 140/90  Pulse: 88 78  Temp: 99.1 F (37.3 C)   TempSrc: Oral   SpO2: 95%   Weight: 268 lb (121.6 kg)   Height: 5\' 5"  (1.651 m)      Body mass index is 44.6 kg/m.   Physical Exam:    Physical Exam  Constitutional: She appears well-developed. She is cooperative.  Non-toxic appearance. She does not have a sickly appearance. She does not appear ill. No distress.  Cardiovascular: Normal rate, regular rhythm, S1 normal, S2 normal, normal heart sounds and normal pulses.  No LE edema  Pulmonary/Chest: Effort normal and breath sounds normal.  Neurological: She is alert. GCS eye subscore is 4. GCS verbal subscore is 5. GCS motor subscore is 6.  Skin: Skin is warm, dry and intact.  Psychiatric: She has a normal mood and affect. Her speech is normal and behavior is normal.  Nursing note and vitals reviewed.    Assessment and Plan:    [name] was seen today for hypertension.  Diagnoses and all orders for this visit:  Depression, major, single episode, moderate  (HCC) I recommended that she contact her psychiatrist immediately for follow-up appointment.  Discussed potential options and she is interested in starting Wellbutrin.  I discussed that if she is unable to get in with her psychiatrist within a week we could potentially start Wellbutrin daily.  I explained to patient that important to talk to her husband and let him know if she does decide to take this medicine to monitor her mood.  I also discussed starting to go on to walks throughout the week for about 10 minutes.  Patient verbalized understanding is agreeable to plan.I discussed with patient that if they develop any SI, to tell someone immediately and seek medical attention.  Fatigue, unspecified type I suspect that this is related to her depression however will check labs to rule out other etiology. -     CBC with Differential/Platelet -     Comprehensive metabolic panel -     Vitamin B12 -     TSH  Vitamin D deficiency -     VITAMIN D 25 Hydroxy (Vit-D Deficiency, Fractures)  Hypertension, unspecified type Currently well controlled.  Will continue Norvasc 10 mg daily.  Breast pain Discussed with patient that it is likely best if she goes to OB/GYN to order a diagnostic mammogram to compare prior mammograms using the same equipment and machines.  Patient verbalized understanding.  Other orders -     buPROPion (WELLBUTRIN XL) 150 MG 24 hr tablet; Take 1 tablet (150 mg total) by mouth daily. Start 150 mg ER PO qam. -     amLODipine (NORVASC) 10 MG tablet; Take 1 tablet (10 mg total) by mouth daily.   . Reviewed expectations re: course of current medical issues. . Discussed self-management of symptoms. . Outlined signs and symptoms indicating need for more acute intervention. .[name]  Patient verbalized understanding and all questions were answered. . See orders for this visit as documented in the electronic medical record. . Patient received an After Visit Summary.  CMA or LPN served as  scribe during this visit. History, Physical, and Plan performed by medical provider. The above documentation has been reviewed and is accurate and complete.   [name], PA-C [place] [place] 07/16/2018  Follow-up: No follow-ups on file.

## 2018-07-16 NOTE — Patient Instructions (Addendum)
It was great to see you!  1. Call Dr. Evelene CroonKaur and make an appointment. If you are able to get in within the week, I recommend not starting the wellbutrin and let her decide if this is best for you.  2. Start walking 10 minutes approximately twice a week.  3. [facility] call [facility] so they can order the mammogram and compare your results on the same machine.  I will be in touch with your labs.  Let's follow-up in 3 months, sooner if you have concerns.  Take care,  Jarold MottoSamantha  PA-C

## 2018-07-18 ENCOUNTER — Other Ambulatory Visit: Payer: Self-pay | Admitting: Obstetrics and Gynecology

## 2018-07-18 DIAGNOSIS — N644 Mastodynia: Secondary | ICD-10-CM | POA: Diagnosis not present

## 2018-07-19 ENCOUNTER — Encounter: Payer: Self-pay | Admitting: Physician Assistant

## 2018-07-19 ENCOUNTER — Ambulatory Visit: Payer: Self-pay

## 2018-07-23 ENCOUNTER — Ambulatory Visit
Admission: RE | Admit: 2018-07-23 | Discharge: 2018-07-23 | Disposition: A | Discharge status: Home / Self Care | Payer: 59 | Source: Ambulatory Visit | Attending: Obstetrics and Gynecology | Admitting: Obstetrics and Gynecology

## 2018-07-23 ENCOUNTER — Ambulatory Visit: Payer: Self-pay

## 2018-07-23 DIAGNOSIS — N644 Mastodynia: Secondary | ICD-10-CM

## 2018-07-23 DIAGNOSIS — R928 Other abnormal and inconclusive findings on diagnostic imaging of breast: Secondary | ICD-10-CM | POA: Diagnosis not present

## 2018-07-26 ENCOUNTER — Ambulatory Visit (INDEPENDENT_AMBULATORY_CARE_PROVIDER_SITE_OTHER): Payer: 59

## 2018-07-26 DIAGNOSIS — E538 Deficiency of other specified B group vitamins: Secondary | ICD-10-CM

## 2018-07-26 MED ORDER — CYANOCOBALAMIN 1000 MCG/ML IJ SOLN
1000.0000 ug | Freq: Once | INTRAMUSCULAR | Status: AC
  Administered 2018-07-26: 1000 ug via INTRAMUSCULAR

## 2018-07-26 NOTE — Progress Notes (Signed)
Cyanocobalamin 1000 mcg/mL, 1 mL given IM, LT deltoid, mfg [employer], lot# [id number], exp APR21, ndc 0517-0031-01, pt tolerated well.

## 2018-08-02 ENCOUNTER — Ambulatory Visit (INDEPENDENT_AMBULATORY_CARE_PROVIDER_SITE_OTHER): Payer: 59

## 2018-08-02 DIAGNOSIS — E538 Deficiency of other specified B group vitamins: Secondary | ICD-10-CM

## 2018-08-02 MED ORDER — CYANOCOBALAMIN 1000 MCG/ML IJ SOLN
1000.0000 ug | Freq: Once | INTRAMUSCULAR | Status: AC
  Administered 2018-08-02: 1000 ug via INTRAMUSCULAR

## 2018-08-02 NOTE — Progress Notes (Signed)
Per orders of Jarold MottoSamantha Worley, PA-C, injection of Vitamin b12 1000 mcg given right deltoid IM by Olevia BowensJennifer M , CMA Patient tolerated injection well.  Pt will schedule her final weekly injection for next week.

## 2018-08-09 ENCOUNTER — Ambulatory Visit: Payer: 59

## 2018-08-09 DIAGNOSIS — E538 Deficiency of other specified B group vitamins: Secondary | ICD-10-CM

## 2018-08-09 MED ORDER — CYANOCOBALAMIN 1000 MCG/ML IJ SOLN: 1000.0000 ug | Freq: Once | INTRAMUSCULAR | Status: DC

## 2018-08-09 NOTE — Progress Notes (Signed)
Per orders of Dr. Artis FlockWolfe , injection of B-12 given by Donnamarie PoagJoellen Y  left deltoid.  Patient tolerated injection well. Will make next appointment at [facility].

## 2018-09-09 ENCOUNTER — Other Ambulatory Visit: Payer: Self-pay | Admitting: Physician Assistant

## 2018-09-13 ENCOUNTER — Ambulatory Visit: Payer: 59

## 2018-09-13 NOTE — Progress Notes (Deleted)
Per orders of [name], injection of B12 given by Sherrin Daisy. Patient tolerated injection well.

## 2018-09-17 ENCOUNTER — Ambulatory Visit: Payer: 59 | Admitting: Sports Medicine

## 2018-09-17 ENCOUNTER — Ambulatory Visit: Payer: Self-pay | Admitting: Physician Assistant

## 2018-09-18 ENCOUNTER — Encounter: Payer: Self-pay | Admitting: Sports Medicine

## 2018-09-18 ENCOUNTER — Ambulatory Visit (INDEPENDENT_AMBULATORY_CARE_PROVIDER_SITE_OTHER): Payer: 59

## 2018-09-18 ENCOUNTER — Ambulatory Visit: Payer: 59 | Admitting: Sports Medicine

## 2018-09-18 VITALS — BP 138/86 | HR 87 | Ht 65.0 in | Wt 267.4 lb

## 2018-09-18 DIAGNOSIS — M542 Cervicalgia: Secondary | ICD-10-CM | POA: Diagnosis not present

## 2018-09-18 DIAGNOSIS — M170 Bilateral primary osteoarthritis of knee: Secondary | ICD-10-CM | POA: Diagnosis not present

## 2018-09-18 DIAGNOSIS — E538 Deficiency of other specified B group vitamins: Secondary | ICD-10-CM | POA: Diagnosis not present

## 2018-09-18 MED ORDER — CYANOCOBALAMIN 1000 MCG/ML IJ SOLN
1000.0000 ug | Freq: Once | INTRAMUSCULAR | Status: AC
  Administered 2018-09-18: 1000 ug via INTRAMUSCULAR

## 2018-09-18 NOTE — Progress Notes (Signed)
Aimee [name]. [name] at [facility] [phone]  [name] - 58 y.o. female MRN [id number]  Date of birth: 03-26-1961  Visit Date: September 18, 2018  PCP: [name], [place]   Referred by: [name], [place]  SUBJECTIVE:  Chief Complaint  Patient presents with  . Follow-up    B knee pain.  B Zilretta and steroid injections.  Pennsaid.  Body Helix    HPI: Patient presents for follow-up of bilateral knee pain.  She reports overall this is significantly better but she continues to have stiffness.  She describes positive theater sign her frequently.  She has numbness in her bilateral legs that is unchanged.  Pain is worse with sitting, transitioning from sit to stand and with steps.  She has been taking Duexis and is undergone repeat ultrasound-guided injections with moderate improvement.  She is having right greater than left-sided neck pain.  No radicular symptoms.  She has slight mechanical symptoms without radicular component.  She does report that increased stress levels have contributed to worsening neck pain.  REVIEW OF SYSTEMS: Denies fevers, chills, recent weight gain or weight loss.  No night sweats. No significant nighttime awakenings due to this issue. Pt denies any change in bowel or bladder habits, muscle weakness, numbness or falls associated with this pain.   HISTORY:  Prior history reviewed and updated per electronic medical record.  Patient Active Problem List   Diagnosis Date Noted  . PVC (premature ventricular contraction) 01/11/2018  . Knee osteoarthritis 09/25/2017    XR B knees 09/25/08 Right IMPRESSION: Advanced tricompartmental osteoarthritic changes in both knees with associated small joint effusions. Left IMPRESSION: Advanced tricompartmental osteoarthritic changes in both knees with associated small joint effusions.  Tricompartmental degenerative changes, most focally patellofemoral wear  bilaterally.  B knee XR- 09/25/17  Bilateral corticosteroid inj 09/25/2017, 07/09/2018  Bilateral Zilretta injections on 01/01/2018, repeat on 04/03/2018   . AC separation, left, initial encounter 05/26/2017  . Acute left-sided low back pain without sciatica 05/26/2017  . Hypertension 04/06/2017  . Anxiety 04/06/2017  . Arthritis 04/06/2017   Social History   Occupational History  . Not on file  Tobacco Use  . Smoking status: Never Smoker  . Smokeless tobacco: Never Used  Substance and Sexual Activity  . Alcohol use: No  . Drug use: No  . Sexual activity: Yes    Birth control/protection: Surgical   Social History   Social History Narrative   Goes by Aimee [name]   Works for [name] -- working there for 24 years   Married, 2 kids (34 and 15) and 1 grandson    OBJECTIVE:  VS:  HT:5\' 5"  (165.1 cm)   WT:267 lb 6.4 oz (121.3 kg)  BMI:44.5    BP:138/86  HR:87bpm  TEMP: ( )  RESP:95 %   PHYSICAL EXAM: Neck has an increased anterior carriage.  She has tightness within the anterior strap muscles.  Slight limitations in cervical sidebending and rotation with minimal.  Negative Spurling's compression test Lhermitte's compression test.   Bilateral knee has generalized osteophytic bossing that is mild.  No significant effusion but moderate synovitis.  Extensor mechanism intact.   ASSESSMENT:  1. Neck pain   2. Primary osteoarthritis of both knees     PROCEDURES:  PROCEDURE NOTE: THERAPEUTIC EXERCISES (29924)   Discussed the foundation of treatment for this condition is physical therapy and/or daily (5-6 days/week) therapeutic exercises, focusing on core strengthening, coordination, neuromuscular control/reeducation.  15 minutes spent for Therapeutic exercises as below and as referenced in the AVS. This included exercises focusing on stretching, strengthening, with significant focus on eccentric aspects.  Proper technique shown and discussed handout in great detail with  ATC. All questions were discussed and answered.   Long term goals include an improvement in range of motion, strength, endurance as well as avoiding reinjury. Frequency of visits is one time as determined during today's office visit. Frequency of exercises to be performed is as per handout.  EXERCISES REVIEWED:  Cervical Towel Stretching Exercises      PLAN:  Pertinent additional documentation may be included in corresponding procedure notes, imaging studies, problem based documentation and patient instructions.  No problem-specific Assessment & Plan notes found for this encounter.   We will plan to get her set up for Visco supplementation versus Zilretta injections and approval for both of these was begun today.  We will plan to call her with the options moving forward and she will follow-up for that at her convenience for procedure only visit.  Neck has marked tightness within the anterior chain strap muscles and she will benefit from cervical towel stretching as reviewed per procedure note.   Home Therapeutic exercises prescribed today per procedure note.  Activity modifications and the importance of avoiding exacerbating activities (limiting pain to no more than a 4 / 10 during or following activity) recommended and discussed.  Discussed red flag symptoms that warrant earlier emergent evaluation and patient voices understanding.   No orders of the defined types were placed in this encounter.  Lab Orders  No laboratory test(s) ordered today   Imaging Orders  No imaging studies ordered today   Referral Orders  No referral(s) requested today    Return for knee injections once you hear back from us about knee injection benefit coverage.          Aimee MewsMichael [name] , DO    [facility]

## 2018-09-18 NOTE — Progress Notes (Signed)
Cyanocobalamin 1000 mcg/1 mL given IM, deltoid, mfg: [employer], lot# [id number], exp [id number], ndc [id number], pt tolerated well.

## 2018-09-18 NOTE — Patient Instructions (Addendum)
Please perform the exercise program that we have prepared for you and gone over in detail on a daily basis.  In addition to the handout you were provided you can access your program through: [email or web address]   Your unique program code is:  [email or web address]   We will call you regarding insurance benefit coverage for different knee injection options and can get scheduled for an appointment at that time.

## 2018-10-10 ENCOUNTER — Ambulatory Visit: Payer: 59 | Admitting: Physician Assistant

## 2018-10-10 ENCOUNTER — Encounter: Payer: Self-pay | Admitting: Physician Assistant

## 2018-10-10 VITALS — BP 122/80 | HR 94 | Temp 99.0°F | Ht 65.0 in | Wt 270.0 lb

## 2018-10-10 DIAGNOSIS — F419 Anxiety disorder, unspecified: Secondary | ICD-10-CM

## 2018-10-10 DIAGNOSIS — E559 Vitamin D deficiency, unspecified: Secondary | ICD-10-CM | POA: Diagnosis not present

## 2018-10-10 DIAGNOSIS — E538 Deficiency of other specified B group vitamins: Secondary | ICD-10-CM | POA: Diagnosis not present

## 2018-10-10 DIAGNOSIS — R7309 Other abnormal glucose: Secondary | ICD-10-CM | POA: Diagnosis not present

## 2018-10-10 DIAGNOSIS — F321 Major depressive disorder, single episode, moderate: Secondary | ICD-10-CM | POA: Insufficient documentation

## 2018-10-10 DIAGNOSIS — R635 Abnormal weight gain: Secondary | ICD-10-CM

## 2018-10-10 LAB — POCT GLYCOSYLATED HEMOGLOBIN (HGB A1C): Hemoglobin A1C: 5.4 % (ref 4.0–5.6)

## 2018-10-10 MED ORDER — CYANOCOBALAMIN 1000 MCG/ML IJ SOLN
1000.0000 ug | Freq: Once | INTRAMUSCULAR | Status: AC
  Administered 2018-10-10: 1000 ug via INTRAMUSCULAR

## 2018-10-10 NOTE — Patient Instructions (Signed)
It was great to see you!  Your blood sugars are great right now. Let's have you meet with me for a nutrition visit at your convenience --> this is an hour long appointment.  Take care,  [name] PA-C

## 2018-10-10 NOTE — Progress Notes (Signed)
Aimee [name] is a 58 y.o. female is here to follow up on anxiety and depression.  I acted as a Neurosurgeon for [employer], PA-C Corky Mull, LPN  History of Present Illness:   Chief Complaint  Patient presents with  . F/u for B12 and Vit D  . Anxiety  . Depression    HPI  Anxiety/Depression Pt is here today to follow up on anxiety and depression. Since last visit pt says her anxiety and depression is doing a little better. She says she will be changing jobs.  She denies suicidal thoughts.  She is currently off Prozac for several months.  She does see a psychiatrist, Dr. Evelene Croon, has an appointment scheduled for March. She has been taking her Xanax every night for sleep and it is helping.  She states that she has been eating more, feels like she is doing a lot of comfort eating and thus has had weight gain. Fatigue is a little better but still having  decreased motivation or get out of bed. Denies current suicidal ideation.   She would also like her vitamin D checked, she has been taking some 50k IU supplements.  She is hoping to work on weight loss. She is currently at her highest weight. Does have hx of elevated glucose.  There are no preventive care reminders to display for this patient.  Past Medical History:  Diagnosis Date  . Anxiety   . Arthritis   . Depression   . GERD (gastroesophageal reflux disease)   . Hypertension      Social History   Socioeconomic History  . Marital status: Married    Spouse name: Not on file  . Number of children: Not on file  . Years of education: Not on file  . Highest education level: Not on file  Occupational History  . Not on file  Social Needs  . Financial resource strain: Not on file  . Food insecurity:    Worry: Not on file    Inability: Not on file  . Transportation needs:    Medical: Not on file    Non-medical: Not on file  Tobacco Use  . Smoking status: Never Smoker  . Smokeless tobacco: Never Used  Substance and  Sexual Activity  . Alcohol use: No  . Drug use: No  . Sexual activity: Yes    Birth control/protection: Surgical  Lifestyle  . Physical activity:    Days per week: Not on file    Minutes per session: Not on file  . Stress: Not on file  Relationships  . Social connections:    Talks on phone: Not on file    Gets together: Not on file    Attends religious service: Not on file    Active member of club or organization: Not on file    Attends meetings of clubs or organizations: Not on file    Relationship status: Not on file  . Intimate partner violence:    Fear of current or ex partner: Not on file    Emotionally abused: Not on file    Physically abused: Not on file    Forced sexual activity: Not on file  Other Topics Concern  . Not on file  Social History Narrative   Goes by [employer]   Works for [employer] -- working there for 24 years   Married, 2 kids (34 and 15) and 1 grandson    Past Surgical History:  Procedure Laterality Date  . ABDOMINAL  HYSTERECTOMY  2004  . CESAREAN SECTION  2003  . KNEE ARTHROSCOPY Right 2013    Family History  Problem Relation Age of Onset  . Colon cancer Mother 34  . Heart disease Father   . Hypertension Father   . Heart attack Father 53  . Stroke Father   . Diabetes Sister   . Breast cancer Sister 72  . Heart attack Sister     PMHx, SurgHx, SocialHx, FamHx, Medications, and Allergies were reviewed in the Visit Navigator and updated as appropriate.   Patient Active Problem List   Diagnosis Date Noted  . Depression, major, single episode, moderate (HCC) 10/10/2018  . PVC (premature ventricular contraction) 01/11/2018  . Knee osteoarthritis 09/25/2017  . AC separation, left, initial encounter 05/26/2017  . Acute left-sided low back pain without sciatica 05/26/2017  . Hypertension 04/06/2017  . Anxiety 04/06/2017  . Arthritis 04/06/2017    Social History   Tobacco Use  . Smoking status: Never Smoker  . Smokeless tobacco:  Never Used  Substance Use Topics  . Alcohol use: No  . Drug use: No    Current Medications and Allergies:    Current Outpatient Medications:  .  ALPRAZolam (XANAX) 0.5 MG tablet, Take 0.5 mg by mouth 4 (four) times daily as needed. , Disp: , Rfl:  .  amLODipine (NORVASC) 10 MG tablet, TAKE 1 TABLET(10 MG) BY MOUTH DAILY, Disp: 90 tablet, Rfl: 1 .  Cholecalciferol (VITAMIN D3) 1000 units CAPS, Take 1 capsule by mouth daily., Disp: , Rfl:  .  Diclofenac Sodium (PENNSAID) 2 % SOLN, Place 1 application onto the skin 2 (two) times daily., Disp: 112 g, Rfl: 2 .  fluticasone (FLONASE) 50 MCG/ACT nasal spray, Place 2 sprays into both nostrils daily., Disp: 16 g, Rfl: 2 .  ibuprofen (ADVIL) 200 MG tablet, Take 400 mg by mouth as needed., Disp: , Rfl:  .  montelukast (SINGULAIR) 10 MG tablet, Take 10 mg by mouth at bedtime., Disp: , Rfl:  .  Multiple Vitamin (MULTIVITAMIN) tablet, Take 1 tablet by mouth daily., Disp: , Rfl:  .  PREVIDENT 5000 SENSITIVE 1.1-5 % PSTE, See admin instructions., Disp: , Rfl: 5 .  Vitamin D, Ergocalciferol, (DRISDOL) 1.25 MG (50000 UT) CAPS capsule, Take 50,000 Units by mouth every 7 (seven) days. Pt states that she will take this dose x 4 days, then switch to OTC supplement of 2000 iu qd, Disp: , Rfl:  .  vortioxetine HBr (TRINTELLIX) 10 MG TABS tablet, Take 10 mg by mouth daily. Take 1 tablet po qd.  Pt states as 12/29 her dose is being increased to 20 mg qd, Disp: , Rfl:   Current Facility-Administered Medications:  .  cyanocobalamin ((VITAMIN B-12)) injection 1,000 mcg, 1,000 mcg, Intramuscular, Once, Orland Mustard, MD  No Known Allergies  Review of Systems   Review of Systems  Constitutional: Negative for chills, fever, malaise/fatigue and weight loss.  Respiratory: Negative for shortness of breath.   Cardiovascular: Negative for chest pain, orthopnea, claudication and leg swelling.  Gastrointestinal: Negative for heartburn, nausea and vomiting.    Neurological: Negative for dizziness, tingling and headaches.  Psychiatric/Behavioral: Positive for depression. The patient is nervous/anxious.     Vitals:   Vitals:   10/10/18 1510  BP: 122/80  Pulse: 94  Temp: 99 F (37.2 C)  TempSrc: Oral  SpO2: 96%  Weight: 270 lb (122.5 kg)  Height: 5\' 5"  (1.651 m)     Body mass index is 44.93 kg/m.   Physical  Exam:    Physical Exam Vitals signs and nursing note reviewed.  Constitutional:      General: She is not in acute distress.    Appearance: She is well-developed. She is not ill-appearing or toxic-appearing.  Cardiovascular:     Rate and Rhythm: Normal rate and regular rhythm.     Pulses: Normal pulses.     Heart sounds: Normal heart sounds, S1 normal and S2 normal.     Comments: No LE edema Pulmonary:     Effort: Pulmonary effort is normal.     Breath sounds: Normal breath sounds.  Skin:    General: Skin is warm and dry.  Neurological:     Mental Status: She is alert.     GCS: GCS eye subscore is 4. GCS verbal subscore is 5. GCS motor subscore is 6.  Psychiatric:        Speech: Speech normal.        Behavior: Behavior normal. Behavior is cooperative.    Results for orders placed or performed in visit on 10/10/18  POCT HgB A1C  Result Value Ref Range   Hemoglobin A1C 5.4 4.0 - 5.6 %      Assessment and Plan:    Pranika was seen today for f/u for b12 and vit d, anxiety and depression.  Diagnoses and all orders for this visit:  Elevated glucose/Weight gain HgbA1c is well controlled, no need for intervention at this time. We are going to have her meet with me for a nutrition session at her convenience. -     POCT HgB A1C -     Basic metabolic panel  Vitamin B12 deficiency -     cyanocobalamin ((VITAMIN B-12)) injection 1,000 mcg  Vitamin D deficiency -     VITAMIN D 25 Hydroxy (Vit-D Deficiency, Fractures)  Anxiety and Depression, major, single episode, moderate (HCC) Well controlled, management per Dr.  Evelene Croon.   . Reviewed expectations re: course of current medical issues. . Discussed self-management of symptoms. . Outlined signs and symptoms indicating need for more acute intervention. . Patient verbalized understanding and all questions were answered. . See orders for this visit as documented in the electronic medical record. . Patient received an After Visit Summary.  CMA or LPN served as scribe during this visit. History, Physical, and Plan performed by medical provider. The above documentation has been reviewed and is accurate and complete.   [name], PA-C [place], [place] 10/10/2018  Follow-up: No follow-ups on file.

## 2018-10-11 LAB — BASIC METABOLIC PANEL
BUN: 12 mg/dL (ref 6–23)
CALCIUM: 9.1 mg/dL (ref 8.4–10.5)
CO2: 28 mEq/L (ref 19–32)
Chloride: 105 mEq/L (ref 96–112)
Creatinine, Ser: 0.84 mg/dL (ref 0.40–1.20)
GFR: 84.76 mL/min (ref >60.00)
Glucose, Bld: 104 mg/dL — ABNORMAL HIGH (ref 70–99)
Potassium: 3.5 mEq/L (ref 3.5–5.1)
Sodium: 143 mEq/L (ref 135–145)

## 2018-10-11 LAB — VITAMIN D 25 HYDROXY (VIT D DEFICIENCY, FRACTURES): VITD: 34.82 ng/mL (ref 30.00–100.00)

## 2018-10-15 ENCOUNTER — Ambulatory Visit: Payer: 59 | Admitting: Physician Assistant

## 2018-10-20 DIAGNOSIS — R509 Fever, unspecified: Secondary | ICD-10-CM | POA: Diagnosis not present

## 2018-10-20 DIAGNOSIS — I10 Essential (primary) hypertension: Secondary | ICD-10-CM | POA: Diagnosis not present

## 2018-10-20 DIAGNOSIS — J029 Acute pharyngitis, unspecified: Secondary | ICD-10-CM | POA: Diagnosis not present

## 2018-10-24 ENCOUNTER — Ambulatory Visit: Payer: 59 | Admitting: Physician Assistant

## 2018-10-24 ENCOUNTER — Other Ambulatory Visit: Payer: Self-pay

## 2018-10-24 ENCOUNTER — Encounter: Payer: Self-pay | Admitting: Physician Assistant

## 2018-10-24 VITALS — BP 138/92 | HR 86 | Temp 99.7°F | Ht 65.0 in | Wt 266.5 lb

## 2018-10-24 DIAGNOSIS — R05 Cough: Secondary | ICD-10-CM

## 2018-10-24 DIAGNOSIS — R059 Cough, unspecified: Secondary | ICD-10-CM

## 2018-10-24 MED ORDER — AZITHROMYCIN 250 MG PO TABS: ORAL_TABLET | ORAL | 0 refills | Status: DC

## 2018-10-24 MED ORDER — BUDESONIDE-FORMOTEROL FUMARATE 80-4.5 MCG/ACT IN AERO: 2.0000 | INHALATION_SPRAY | Freq: Two times a day (BID) | RESPIRATORY_TRACT | 3 refills | Status: DC

## 2018-10-24 MED ORDER — HYDROCOD POLST-CPM POLST ER 10-8 MG/5ML PO SUER: 5.0000 mL | Freq: Every evening | ORAL | 0 refills | Status: DC | PRN

## 2018-10-24 NOTE — Patient Instructions (Signed)
It was great to see you!  Start antibiotic  Use inhaler in AM and PM  Cough syrup only at night -- may make you drowsy  Push fluids and get plenty of rest. Please return if you are not improving as expected, or if you have high fevers (>101.5) or difficulty swallowing or worsening productive cough.  Call clinic with questions.  I hope you start feeling better soon!

## 2018-10-24 NOTE — Progress Notes (Signed)
Aimee [name] is a 58 y.o. female here for a new problem.  History of Present Illness:   Chief Complaint  Patient presents with  . Cough    x4days  . Fever    101  . Wheezing    Pt had flu and strep test performed Sat at [facility]    HPI   Patient presents with cough x 4 days. [facility] and was given prednisone, tessalon perles and albuterol inhaler. She states that her symptoms are worsening with time. She had a fever on Tuesday.  She is taking the medications as ordered. Appetite is fair, no GI symptoms.  Past Medical History:  Diagnosis Date  . Anxiety   . Arthritis   . Depression   . GERD (gastroesophageal reflux disease)   . Hypertension      Social History   Socioeconomic History  . Marital status: Married    Spouse name: Not on file  . Number of children: Not on file  . Years of education: Not on file  . Highest education level: Not on file  Occupational History  . Not on file  Social Needs  . Financial resource strain: Not on file  . Food insecurity:    Worry: Not on file    Inability: Not on file  . Transportation needs:    Medical: Not on file    Non-medical: Not on file  Tobacco Use  . Smoking status: Never Smoker  . Smokeless tobacco: Never Used  Substance and Sexual Activity  . Alcohol use: No  . Drug use: No  . Sexual activity: Yes    Birth control/protection: Surgical  Lifestyle  . Physical activity:    Days per week: Not on file    Minutes per session: Not on file  . Stress: Not on file  Relationships  . Social connections:    Talks on phone: Not on file    Gets together: Not on file    Attends religious service: Not on file    Active member of club or organization: Not on file    Attends meetings of clubs or organizations: Not on file    Relationship status: Not on file  . Intimate partner violence:    Fear of current or ex partner: Not on file    Emotionally abused: Not on file    Physically abused: Not  on file    Forced sexual activity: Not on file  Other Topics Concern  . Not on file  Social History Narrative   Goes by [employer]   Works for [employer] -- working there for 24 years   Married, 2 kids (34 and 15) and 1 grandson    Past Surgical History:  Procedure Laterality Date  . ABDOMINAL HYSTERECTOMY  2004  . CESAREAN SECTION  2003  . KNEE ARTHROSCOPY Right 2013    Family History  Problem Relation Age of Onset  . Colon cancer Mother 48  . Heart disease Father   . Hypertension Father   . Heart attack Father 74  . Stroke Father   . Diabetes Sister   . Breast cancer Sister 78  . Heart attack Sister     No Known Allergies  Current Medications:   Current Outpatient Medications:  .  ALPRAZolam (XANAX) 0.5 MG tablet, Take 0.5 mg by mouth 4 (four) times daily as needed. , Disp: , Rfl:  .  amLODipine (NORVASC) 10 MG tablet, TAKE 1 TABLET(10 MG) BY  MOUTH DAILY, Disp: 90 tablet, Rfl: 1 .  Cholecalciferol (VITAMIN D3) 1000 units CAPS, Take 1 capsule by mouth daily., Disp: , Rfl:  .  Diclofenac Sodium (PENNSAID) 2 % SOLN, Place 1 application onto the skin 2 (two) times daily., Disp: 112 g, Rfl: 2 .  fluticasone (FLONASE) 50 MCG/ACT nasal spray, Place 2 sprays into both nostrils daily., Disp: 16 g, Rfl: 2 .  ibuprofen (ADVIL) 200 MG tablet, Take 400 mg by mouth as needed., Disp: , Rfl:  .  montelukast (SINGULAIR) 10 MG tablet, Take 10 mg by mouth at bedtime., Disp: , Rfl:  .  Multiple Vitamin (MULTIVITAMIN) tablet, Take 1 tablet by mouth daily., Disp: , Rfl:  .  predniSONE (DELTASONE) 10 MG tablet, TK 4 TS PO D WF FOR 5 DAYS, Disp: , Rfl:  .  PREVIDENT 5000 SENSITIVE 1.1-5 % PSTE, See admin instructions., Disp: , Rfl: 5 .  PROAIR HFA 108 (90 Base) MCG/ACT inhaler, INHALE 1 TO 2 PUFFS PO Q 4 TO 6 HOURS PRN FOR WHEEZING, Disp: , Rfl:  .  Vitamin D, Ergocalciferol, (DRISDOL) 1.25 MG (50000 UT) CAPS capsule, Take 50,000 Units by mouth every 7 (seven) days. Pt states that she will  take this dose x 4 days, then switch to OTC supplement of 2000 iu qd, Disp: , Rfl:  .  vortioxetine HBr (TRINTELLIX) 10 MG TABS tablet, Take 10 mg by mouth daily. Take 1 tablet po qd.  Pt states as 12/29 her dose is being increased to 20 mg qd, Disp: , Rfl:  .  azithromycin (ZITHROMAX) 250 MG tablet, Take two tablets on day 1, then one tablet daily x 4 days, Disp: 6 tablet, Rfl: 0 .  benzonatate (TESSALON) 100 MG capsule, , Disp: , Rfl:  .  budesonide-formoterol (SYMBICORT) 80-4.5 MCG/ACT inhaler, Inhale 2 puffs into the lungs 2 (two) times daily., Disp: 1 Inhaler, Rfl: 3 .  chlorpheniramine-HYDROcodone (TUSSIONEX PENNKINETIC ER) 10-8 MG/5ML SUER, Take 5 mLs by mouth at bedtime as needed for cough., Disp: 60 mL, Rfl: 0  Current Facility-Administered Medications:  .  cyanocobalamin ((VITAMIN B-12)) injection 1,000 mcg, 1,000 mcg, Intramuscular, Once, Orland Mustard, MD   Review of Systems:   Review of Systems  Constitutional: Negative for chills, fever, malaise/fatigue and weight loss.  HENT: Positive for congestion and sore throat. Negative for ear pain.   Respiratory: Positive for cough. Negative for shortness of breath.   Cardiovascular: Negative for chest pain, orthopnea, claudication and leg swelling.  Gastrointestinal: Negative for heartburn, nausea and vomiting.  Neurological: Negative for dizziness, tingling and headaches.      Vitals:   Vitals:   10/24/18 1506  BP: (!) 138/92  Pulse: 86  Temp: 99.7 F (37.6 C)  TempSrc: Oral  SpO2: 93%  Weight: 266 lb 8 oz (120.9 kg)  Height: 5\' 5"  (1.651 m)     Body mass index is 44.35 kg/m.  Physical Exam:   Physical Exam Vitals signs and nursing note reviewed.  Constitutional:      General: She is not in acute distress.    Appearance: She is well-developed. She is not ill-appearing or toxic-appearing.  HENT:     Head: Normocephalic and atraumatic.     Right Ear: Tympanic membrane, ear canal and external ear normal. Tympanic  membrane is not erythematous, retracted or bulging.     Left Ear: Tympanic membrane, ear canal and external ear normal. Tympanic membrane is not erythematous, retracted or bulging.     Nose: Mucosal edema, congestion  and rhinorrhea present.     Right Sinus: Maxillary sinus tenderness present. No frontal sinus tenderness.     Left Sinus: Maxillary sinus tenderness present. No frontal sinus tenderness.     Mouth/Throat:     Mouth: Mucous membranes are moist.     Pharynx: Uvula midline. Posterior oropharyngeal erythema present.  Eyes:     General: Lids are normal.     Conjunctiva/sclera: Conjunctivae normal.  Neck:     Trachea: Trachea normal.  Cardiovascular:     Rate and Rhythm: Normal rate and regular rhythm.     Heart sounds: Normal heart sounds, S1 normal and S2 normal.  Pulmonary:     Effort: Pulmonary effort is normal.     Breath sounds: Normal breath sounds. No decreased breath sounds, wheezing, rhonchi or rales.  Lymphadenopathy:     Cervical: No cervical adenopathy.  Skin:    General: Skin is warm and dry.  Neurological:     Mental Status: She is alert.  Psychiatric:        Speech: Speech normal.        Behavior: Behavior normal. Behavior is cooperative.      Assessment and Plan:   Tewanda was seen today for cough, fever and wheezing.  Diagnoses and all orders for this visit:  Cough No red flags on exam.  Suspect sinusitis/bronchitis. Will initiate azithromycin, symbicort, tussionex per orders. Discussed taking medications as prescribed. Reviewed return precautions including worsening fever, SOB, worsening cough or other concerns. Push fluids and rest. I recommend that patient follow-up if symptoms worsen or persist despite treatment x 7-10 days, sooner if needed.   Other orders -     azithromycin (ZITHROMAX) 250 MG tablet; Take two tablets on day 1, then one tablet daily x 4 days -     budesonide-formoterol (SYMBICORT) 80-4.5 MCG/ACT inhaler; Inhale 2 puffs into the  lungs 2 (two) times daily. -     chlorpheniramine-HYDROcodone (TUSSIONEX PENNKINETIC ER) 10-8 MG/5ML SUER; Take 5 mLs by mouth at bedtime as needed for cough.    . Reviewed expectations re: course of current medical issues. . Discussed self-management of symptoms. . Outlined signs and symptoms indicating need for more acute intervention. . Patient verbalized understanding and all questions were answered. . See orders for this visit as documented in the electronic medical record. . Patient received an After-Visit Summary.  [name], PA-C

## 2018-10-25 ENCOUNTER — Telehealth: Payer: Self-pay | Admitting: Physician Assistant

## 2018-10-25 NOTE — Telephone Encounter (Signed)
See note  Copied from CRM [id number]. Topic: General - Other >> Oct 25, 2018  1:48 PM Angela Nevin wrote: Reason for CRM: Patient is requesting letter to be out of work from 3/11 until 3/16. Patient was advised to contact office if she needed one. She would like to pick up today, if possible.

## 2018-10-25 NOTE — Telephone Encounter (Signed)
Spoke to pt, asked her if she is going back to work on Monday? Pt said yes. Told her okay I will have letter ready for her she can come around 4:00 PM to pick up. Pt verbalized understanding. Letter done and put at the front desk.

## 2018-10-30 ENCOUNTER — Ambulatory Visit: Payer: 59 | Admitting: Physician Assistant

## 2018-10-30 ENCOUNTER — Encounter: Payer: Self-pay | Admitting: Physician Assistant

## 2018-10-30 ENCOUNTER — Other Ambulatory Visit: Payer: Self-pay

## 2018-10-30 VITALS — BP 146/90 | HR 90 | Temp 98.9°F | Ht 65.0 in | Wt 266.5 lb

## 2018-10-30 DIAGNOSIS — Z713 Dietary counseling and surveillance: Secondary | ICD-10-CM

## 2018-10-30 DIAGNOSIS — E669 Obesity, unspecified: Secondary | ICD-10-CM

## 2018-10-30 NOTE — Patient Instructions (Addendum)
GOAL LIST:  1. Walk a mile a day either outside or on treadmill/elliptical 2. Decrease any sugary beverage consumption to only ONCE per day. Ideally, we will decrease this to a few times a week. 3. If you are on Trazodone, take this at least 30 min to an hour before your bedtime to see if this will help you get sleepy. 4. Limit buying junk food 5. Buy more produce if possible 6. Balance out meals, YOU are in control of portions on your plate

## 2018-10-30 NOTE — Progress Notes (Signed)
Aimee [name] is a 58 y.o. female here for Nutrition Counseling  I acted as a Neurosurgeon for [employer], PA-C Corky Mull, LPN  History of Present Illness:   Chief Complaint  Patient presents with  . Nutrition Counseling    HPI  Patient is here to discuss Nutrition and Diet. Pt would like to lose 40 lbs by end of year. Pt is not exercising at present.  Dietary recall: Breakfast -- fast food Lunch -- fast food Dinner -- fast food Snacks -- cookies, ice cream Beverages  -- sodas, sweet teas  Weight: Wt Readings from Last 3 Encounters:  10/30/18 266 lb 8 oz (120.9 kg)  10/24/18 266 lb 8 oz (120.9 kg)  10/10/18 270 lb (122.5 kg)    Exercise: Not currently  Support system: husband  Sleep: limited  Goals: 1- lower blood pressure 2- lose 40 lb by end of year 3- cut back on sodas  Estimated daily energy needs: Calories: 1600-1800 kcal Protein: 60-75 g Fluid: 2000 ml  Past Medical History:  Diagnosis Date  . Anxiety   . Arthritis   . Depression   . GERD (gastroesophageal reflux disease)   . Hypertension      Social History   Socioeconomic History  . Marital status: Married    Spouse name: Not on file  . Number of children: Not on file  . Years of education: Not on file  . Highest education level: Not on file  Occupational History  . Not on file  Social Needs  . Financial resource strain: Not on file  . Food insecurity:    Worry: Not on file    Inability: Not on file  . Transportation needs:    Medical: Not on file    Non-medical: Not on file  Tobacco Use  . Smoking status: Never Smoker  . Smokeless tobacco: Never Used  Substance and Sexual Activity  . Alcohol use: No  . Drug use: No  . Sexual activity: Yes    Birth control/protection: Surgical  Lifestyle  . Physical activity:    Days per week: Not on file    Minutes per session: Not on file  . Stress: Not on file  Relationships  . Social connections:    Talks on phone: Not on file     Gets together: Not on file    Attends religious service: Not on file    Active member of club or organization: Not on file    Attends meetings of clubs or organizations: Not on file    Relationship status: Not on file  . Intimate partner violence:    Fear of current or ex partner: Not on file    Emotionally abused: Not on file    Physically abused: Not on file    Forced sexual activity: Not on file  Other Topics Concern  . Not on file  Social History Narrative   Goes by [employer]   Works for [employer] -- working there for 24 years   Married, 2 kids (34 and 15) and 1 grandson    Past Surgical History:  Procedure Laterality Date  . ABDOMINAL HYSTERECTOMY  2004  . CESAREAN SECTION  2003  . KNEE ARTHROSCOPY Right 2013    Family History  Problem Relation Age of Onset  . Colon cancer Mother 32  . Heart disease Father   . Hypertension Father   . Heart attack Father 45  . Stroke Father   . Diabetes Sister   .  Breast cancer Sister [id number]  . Heart attack Sister     No Known Allergies  Current Medications:   Current Outpatient Medications:  .  ALPRAZolam (XANAX) 0.5 MG tablet, Take 0.5 mg by mouth 4 (four) times daily as needed. , Disp: , Rfl:  .  amLODipine (NORVASC) 10 MG tablet, TAKE 1 TABLET(10 MG) BY MOUTH DAILY, Disp: 90 tablet, Rfl: 1 .  budesonide-formoterol (SYMBICORT) 80-4.5 MCG/ACT inhaler, Inhale 2 puffs into the lungs 2 (two) times daily., Disp: 1 Inhaler, Rfl: 3 .  chlorpheniramine-HYDROcodone (TUSSIONEX PENNKINETIC ER) 10-8 MG/5ML SUER, Take 5 mLs by mouth at bedtime as needed for cough., Disp: 60 mL, Rfl: 0 .  Cholecalciferol (VITAMIN D3) 1000 units CAPS, Take 1 capsule by mouth daily., Disp: , Rfl:  .  Diclofenac Sodium (PENNSAID) 2 % SOLN, Place 1 application onto the skin 2 (two) times daily., Disp: 112 g, Rfl: 2 .  fluticasone (FLONASE) 50 MCG/ACT nasal spray, Place 2 sprays into both nostrils daily., Disp: 16 g, Rfl: 2 .  ibuprofen (ADVIL) 200 MG tablet,  Take 400 mg by mouth as needed., Disp: , Rfl:  .  montelukast (SINGULAIR) 10 MG tablet, Take 10 mg by mouth at bedtime., Disp: , Rfl:  .  Multiple Vitamin (MULTIVITAMIN) tablet, Take 1 tablet by mouth daily., Disp: , Rfl:  .  PREVIDENT 5000 SENSITIVE 1.1-5 % PSTE, See admin instructions., Disp: , Rfl: 5 .  PROAIR HFA 108 (90 Base) MCG/ACT inhaler, INHALE 1 TO 2 PUFFS PO Q 4 TO 6 HOURS PRN FOR WHEEZING, Disp: , Rfl:  .  Vitamin D, Ergocalciferol, (DRISDOL) 1.25 MG (50000 UT) CAPS capsule, Take 50,000 Units by mouth every 7 (seven) days. Pt states that she will take this dose x 4 days, then switch to OTC supplement of 2000 iu qd, Disp: , Rfl:  .  vortioxetine HBr (TRINTELLIX) 10 MG TABS tablet, Take 10 mg by mouth daily. Take 1 tablet po qd.  Pt states as 12/29 her dose is being increased to 20 mg qd, Disp: , Rfl:   Current Facility-Administered Medications:  .  cyanocobalamin ((VITAMIN B-12)) injection 1,000 mcg, 1,000 mcg, Intramuscular, Once, Orland MustardWolfe, Allison, MD   Review of Systems:   Review of Systems  Constitutional: Negative for chills, fever, malaise/fatigue and weight loss.  Respiratory: Negative for shortness of breath.   Cardiovascular: Negative for chest pain, orthopnea, claudication and leg swelling.  Gastrointestinal: Negative for heartburn, nausea and vomiting.  Neurological: Negative for dizziness, tingling and headaches.    Vitals:   Vitals:   10/30/18 1021  BP: (!) 146/90  Pulse: 90  Temp: 98.9 F (37.2 C)  TempSrc: Oral  SpO2: 95%  Weight: 266 lb 8 oz (120.9 kg)  Height: 5\' 5"  (1.651 m)     Body mass index is 44.35 kg/m.  Physical Exam:   Physical Exam Vitals signs and nursing note reviewed.  Constitutional:      General: She is not in acute distress.    Appearance: She is well-developed. She is not ill-appearing or toxic-appearing.  HENT:     Head: Normocephalic and atraumatic.     Right Ear: Tympanic membrane, ear canal and external ear normal. Tympanic  membrane is not erythematous, retracted or bulging.     Left Ear: Tympanic membrane, ear canal and external ear normal. Tympanic membrane is not erythematous, retracted or bulging.     Nose: Nose normal.     Right Sinus: No maxillary sinus tenderness or frontal sinus tenderness.  Left Sinus: No maxillary sinus tenderness or frontal sinus tenderness.     Mouth/Throat:     Pharynx: Uvula midline. No posterior oropharyngeal erythema.  Eyes:     General: Lids are normal.     Conjunctiva/sclera: Conjunctivae normal.  Neck:     Trachea: Trachea normal.  Cardiovascular:     Rate and Rhythm: Normal rate and regular rhythm.     Heart sounds: Normal heart sounds, S1 normal and S2 normal.  Pulmonary:     Effort: Pulmonary effort is normal.     Breath sounds: Normal breath sounds. No decreased breath sounds, wheezing, rhonchi or rales.  Lymphadenopathy:     Cervical: No cervical adenopathy.  Skin:    General: Skin is warm and dry.  Neurological:     Mental Status: She is alert.  Psychiatric:        Speech: Speech normal.        Behavior: Behavior normal. Behavior is cooperative.     Assessment and Plan:    Glendoris was seen today for nutrition counseling.  Diagnoses and all orders for this visit:  Obesity, unspecified classification, unspecified obesity type, unspecified whether serious comorbidity present  Encounter for nutritional counseling   Discussed specific, individualized recommendations regarding nutrition including decreasing sugary beverages, limiting portions, balancing out meals, and eating regularly throughout the day. Handouts provided included: Balanced Plate, Balanced Snack List, Balanced Breakfast, 1800 Calorie Sample Menus. Provided emotional support and encouraged slow, steady weight loss. Patient's questions answered throughout encounter. Follow-up with me prn.  . Reviewed expectations re: course of current medical issues. . Discussed self-management of  symptoms. . Outlined signs and symptoms indicating need for more acute intervention. . Patient verbalized understanding and all questions were answered. . See orders for this visit as documented in the electronic medical record. . Patient received an After-Visit Summary.  CMA or LPN served as scribe during this visit. History, Physical, and Plan performed by medical provider. Documentation and orders reviewed and attested to.  [name], PA-C

## 2018-11-07 ENCOUNTER — Ambulatory Visit: Payer: 59 | Admitting: Physician Assistant

## 2018-11-07 ENCOUNTER — Encounter: Payer: Self-pay | Admitting: Physician Assistant

## 2018-11-07 DIAGNOSIS — Z719 Counseling, unspecified: Secondary | ICD-10-CM | POA: Diagnosis not present

## 2018-11-07 NOTE — Telephone Encounter (Signed)
Please call pt and schedule Webex for cough per Lelon Mast.

## 2018-11-08 ENCOUNTER — Encounter: Payer: Self-pay | Admitting: Physician Assistant

## 2018-11-08 ENCOUNTER — Ambulatory Visit (INDEPENDENT_AMBULATORY_CARE_PROVIDER_SITE_OTHER): Payer: 59 | Admitting: Physician Assistant

## 2018-11-08 ENCOUNTER — Other Ambulatory Visit: Payer: Self-pay

## 2018-11-08 VITALS — Temp 98.3°F

## 2018-11-08 DIAGNOSIS — R05 Cough: Secondary | ICD-10-CM

## 2018-11-08 DIAGNOSIS — J302 Other seasonal allergic rhinitis: Secondary | ICD-10-CM | POA: Diagnosis not present

## 2018-11-08 DIAGNOSIS — R059 Cough, unspecified: Secondary | ICD-10-CM

## 2018-11-08 MED ORDER — DOXYCYCLINE HYCLATE 100 MG PO TABS: 100.0000 mg | ORAL_TABLET | Freq: Two times a day (BID) | ORAL | 0 refills | Status: DC

## 2018-11-08 MED ORDER — IPRATROPIUM BROMIDE 0.03 % NA SOLN: 2.0000 | Freq: Two times a day (BID) | NASAL | 12 refills | Status: DC

## 2018-11-08 MED ORDER — MONTELUKAST SODIUM 10 MG PO TABS: 10.0000 mg | ORAL_TABLET | Freq: Every day | ORAL | 1 refills | Status: DC

## 2018-11-08 NOTE — Progress Notes (Signed)
Virtual Visit via Video   I connected with Aimee [name] on 11/08/18 at  9:00 AM EDT by a video enabled telemedicine application and verified that I am speaking with the correct person using two identifiers. Location patient: Home Location provider: [facility], Office Persons participating in the virtual visit: Aimee [name], [name], [place], [name], [place]  I discussed the limitations of evaluation and management by telemedicine and the availability of in person appointments. The patient expressed understanding and agreed to proceed.  Subjective:   HPI:  Cough Pt c/o non-productive cough x 2 weeks, having chest congestion feels mucus there but not coming out. Denies fever, chills, SOB, nasal congestion or fatigue. Pt used Tussinex cough syrup but she said it did not help. Pt has been using Mucinex x 3 days not helping. Staying hydrated. Symptoms off and on get worse when she goes outside. Pt needs refill on Singulair has been off more than one month.  She states that she has allergies and feel as though she is reacting the pollen.  ROS: See pertinent positives and negatives per HPI.  Patient Active Problem List   Diagnosis Date Noted  . Depression, major, single episode, moderate (HCC) 10/10/2018  . PVC (premature ventricular contraction) 01/11/2018  . Knee osteoarthritis 09/25/2017  . AC separation, left, initial encounter 05/26/2017  . Acute left-sided low back pain without sciatica 05/26/2017  . Hypertension 04/06/2017  . Anxiety 04/06/2017  . Arthritis 04/06/2017    Social History   Tobacco Use  . Smoking status: Never Smoker  . Smokeless tobacco: Never Used  Substance Use Topics  . Alcohol use: No    Current Outpatient Medications:  .  ALPRAZolam (XANAX) 0.5 MG tablet, Take 0.5 mg by mouth 4 (four) times daily as needed. , Disp: , Rfl:  .  amLODipine (NORVASC) 10 MG tablet, TAKE 1 TABLET(10 MG) BY MOUTH DAILY, Disp: 90 tablet, Rfl: 1 .   budesonide-formoterol (SYMBICORT) 80-4.5 MCG/ACT inhaler, Inhale 2 puffs into the lungs 2 (two) times daily., Disp: 1 Inhaler, Rfl: 3 .  Cholecalciferol (VITAMIN D3) 1000 units CAPS, Take 1 capsule by mouth daily., Disp: , Rfl:  .  Diclofenac Sodium (PENNSAID) 2 % SOLN, Place 1 application onto the skin 2 (two) times daily., Disp: 112 g, Rfl: 2 .  fluticasone (FLONASE) 50 MCG/ACT nasal spray, Place 2 sprays into both nostrils daily., Disp: 16 g, Rfl: 2 .  ibuprofen (ADVIL) 200 MG tablet, Take 400 mg by mouth as needed., Disp: , Rfl:  .  montelukast (SINGULAIR) 10 MG tablet, Take 1 tablet (10 mg total) by mouth at bedtime., Disp: 90 tablet, Rfl: 1 .  Multiple Vitamin (MULTIVITAMIN) tablet, Take 1 tablet by mouth daily., Disp: , Rfl:  .  PREVIDENT 5000 SENSITIVE 1.1-5 % PSTE, See admin instructions., Disp: , Rfl: 5 .  PROAIR HFA 108 (90 Base) MCG/ACT inhaler, INHALE 1 TO 2 PUFFS PO Q 4 TO 6 HOURS PRN FOR WHEEZING, Disp: , Rfl:  .  Vitamin D, Ergocalciferol, (DRISDOL) 1.25 MG (50000 UT) CAPS capsule, Take 50,000 Units by mouth every 7 (seven) days. Pt states that she will take this dose x 4 days, then switch to OTC supplement of 2000 iu qd, Disp: , Rfl:  .  vortioxetine HBr (TRINTELLIX) 10 MG TABS tablet, Take 10 mg by mouth daily. Take 1 tablet po qd.  Pt states as 12/29 her dose is being increased to 20 mg qd, Disp: , Rfl:  .  doxycycline (VIBRA-TABS)  100 MG tablet, Take 1 tablet (100 mg total) by mouth 2 (two) times daily., Disp: 20 tablet, Rfl: 0 .  ipratropium (ATROVENT) 0.03 % nasal spray, Place 2 sprays into both nostrils every 12 (twelve) hours., Disp: 30 mL, Rfl: 12  Current Facility-Administered Medications:  .  cyanocobalamin ((VITAMIN B-12)) injection 1,000 mcg, 1,000 mcg, Intramuscular, Once, Orland Mustard, MD  No Known Allergies  Objective:   VITALS: Per patient if applicable, see vitals. GENERAL: Alert, appears well and in no acute distress. HEENT: Atraumatic, conjunctiva clear,  no obvious abnormalities on inspection of external nose and ears. NECK: Normal movements of the head and neck. CARDIOPULMONARY: No increased WOB. Speaking in clear sentences. I:E ratio WNL. Dry cough throughout encounter. MS: Moves all visible extremities without noticeable abnormality. PSYCH: Pleasant and cooperative, well-groomed. Speech normal rate and rhythm. Affect is appropriate. Insight and judgement are appropriate. Attention is focused, linear, and appropriate.  NEURO: CN grossly intact. Oriented as arrived to appointment on time with no prompting. Moves both UE equally.  SKIN: No obvious lesions, wounds, erythema, or cyanosis noted on face or hands.  Assessment and Plan:    Aimee [name] was seen today for cough.  Diagnoses and all orders for this visit:  Cough  Other orders -     montelukast (SINGULAIR) 10 MG tablet; Take 1 tablet (10 mg total) by mouth at bedtime. -     ipratropium (ATROVENT) 0.03 % nasal spray; Place 2 sprays into both nostrils every 12 (twelve) hours. -     doxycycline (VIBRA-TABS) 100 MG tablet; Take 1 tablet (100 mg total) by mouth 2 (two) times daily.   No red flags on exam or during discussion today. Pt to restart Singulair, keep using Symbicort inhaler and Mucinex DM, start OTC antihistamine like Zyrtec or Claritin. Will send in Singulair, Atrovent nasal spray. Did provide safety net rx for doxycycline and return precautions. If cough persists or does not improve will need excuse for work for next Monday/Tuesday.  . Reviewed expectations re: course of current medical issues. . Discussed self-management of symptoms. . Outlined signs and symptoms indicating need for more acute intervention. . Patient verbalized understanding and all questions were answered. [name] Health Maintenance issues including appropriate healthy diet, exercise, and smoking avoidance were discussed with patient. . See orders for this visit as documented in the electronic medical record.  I  discussed the assessment and treatment plan with the patient. The patient was provided an opportunity to ask questions and all were answered. The patient agreed with the plan and demonstrated an understanding of the instructions.   The patient was advised to call back or seek an in-person evaluation if the symptoms worsen or if the condition fails to improve as anticipated.  CMA or LPN served as scribe during this visit. History, Physical, and Plan performed by medical provider. The above documentation has been reviewed and is accurate and complete.   I provided 20 minutes of non-face-to-face time during this encounter.  [place], [place] 11/08/2018

## 2018-11-08 NOTE — Patient Instructions (Signed)
It was great to see you!  You have a viral upper respiratory infection. Antibiotics are not needed for this.  Viral infections usually take 7-10 days to resolve.  The cough can last a few weeks to go away.  Given the current COVID-19 outbreak, it is my professional recommendation that for your symptoms you should self-isolate for at least 7 days from when your symptoms started. Please do not return to work until you are fever free for at least 3 days without medications and significant improvement of your symptoms.  If you develop severe shortness of breath, uncontrolled fevers, coughing up blood, confusion, chest pain, or signs of dehydration (such as significantly decreased urine amounts or dizziness with standing) please CALL the ER and then GO to the ER.  Push fluids and get plenty of rest.  Call clinic with questions.  I hope you start feeling better soon!  Upper respiratory infection recommendations for those with current or history of elevated blood pressure: 1. Avoid all over-the-counter antihistamines except Claritin/Loratadine and Zyrtec/Cetrizine. 2. Avoid all combination including cold sinus allergies flu decongestant and sleep medications 3. You can use Robitussin DM Mucinex and Mucinex DM for cough.

## 2018-11-09 ENCOUNTER — Ambulatory Visit: Payer: 59 | Admitting: Physician Assistant

## 2019-01-15 ENCOUNTER — Telehealth: Payer: Self-pay

## 2019-01-15 NOTE — Telephone Encounter (Signed)
Called patient to schedule for an appointment with Dr. Katrinka Blazing. Dr. Berline Chough patient.

## 2019-02-05 ENCOUNTER — Other Ambulatory Visit: Payer: Self-pay

## 2019-02-05 ENCOUNTER — Ambulatory Visit: Payer: 59 | Admitting: Family Medicine

## 2019-02-05 ENCOUNTER — Encounter: Payer: Self-pay | Admitting: Family Medicine

## 2019-02-05 DIAGNOSIS — M170 Bilateral primary osteoarthritis of knee: Secondary | ICD-10-CM | POA: Diagnosis not present

## 2019-02-05 NOTE — Assessment & Plan Note (Signed)
Bilateral injections given today.  Could be a candidate for Visco supplementation.  Patient wants to avoid surgical intervention.  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  Encourage weight loss, topical anti-inflammatories icing regimen and vitamin D supplementation.  Patient will follow-up again 6 weeks

## 2019-02-05 NOTE — Patient Instructions (Signed)
Good to see you.  Ice 20 minutes 2 times daily. Usually after activity and before bed. pennsaid pinkie amount topically 2 times daily as needed.  Keep being active Good shoes can help  See me again in 6 weeks if not better

## 2019-02-05 NOTE — Progress Notes (Signed)
Aimee [name]  D.O. [facility] [address] [place], [place] [address] Phone: [phone] Subjective:   I Aimee [name] am serving as a Neurosurgeonscribe for Dr. Antoine PrimasZachary .   CC: Bilateral knee pain  KZS:[id number]:Subjective  Aimee LundSharon R [name] is a 58 y.o. female coming in with complaint of bilateral knee pain. Believes that she needs injections.  Patient has been seen previously.  Has been diagnosed with osteoarthritic changes.  Has not done well with injection.  Last one was 7 months ago.  Patient has been trying to be active but finds it more difficult.  Going from sitting to standing position and walking long distances seem to be the worst.    Past Medical History:  Diagnosis Date  . Anxiety   . Arthritis   . Depression   . GERD (gastroesophageal reflux disease)   . Hypertension    Past Surgical History:  Procedure Laterality Date  . ABDOMINAL HYSTERECTOMY  2004  . CESAREAN SECTION  2003  . KNEE ARTHROSCOPY Right 2013   Social History   Socioeconomic History  . Marital status: Married    Spouse name: Not on file  . Number of children: Not on file  . Years of education: Not on file  . Highest education level: Not on file  Occupational History  . Not on file  Social Needs  . Financial resource strain: Not on file  . Food insecurity    Worry: Not on file    Inability: Not on file  . Transportation needs    Medical: Not on file    Non-medical: Not on file  Tobacco Use  . Smoking status: Never Smoker  . Smokeless tobacco: Never Used  Substance and Sexual Activity  . Alcohol use: No  . Drug use: No  . Sexual activity: Yes    Birth control/protection: Surgical  Lifestyle  . Physical activity    Days per week: Not on file    Minutes per session: Not on file  . Stress: Not on file  Relationships  . Social Musicianconnections    Talks on phone: Not on file    Gets together: Not on file    Attends religious service: Not on file    Active member of club or organization: Not on  file    Attends meetings of clubs or organizations: Not on file    Relationship status: Not on file  Other Topics Concern  . Not on file  Social History Narrative   Goes by [employer]   Works for [employer] -- working there for 24 years   Married, 2 kids (34 and 15) and 1 grandson   No Known Allergies Family History  Problem Relation Age of Onset  . Colon cancer Mother 3752  . Heart disease Father   . Hypertension Father   . Heart attack Father 4472  . Stroke Father   . Diabetes Sister   . Breast cancer Sister 4754  . Heart attack Sister       Current Outpatient Medications (Cardiovascular):  .  amLODipine (NORVASC) 10 MG tablet, TAKE 1 TABLET(10 MG) BY MOUTH DAILY   Current Outpatient Medications (Respiratory):  .  budesonide-formoterol (SYMBICORT) 80-4.5 MCG/ACT inhaler, Inhale 2 puffs into the lungs 2 (two) times daily. .  fluticasone (FLONASE) 50 MCG/ACT nasal spray, Place 2 sprays into both nostrils daily. [name].  ipratropium (ATROVENT) 0.03 % nasal spray, Place 2 sprays into both nostrils every 12 (twelve) hours. .  montelukast (SINGULAIR) 10 MG tablet,  Take 1 tablet (10 mg total) by mouth at bedtime. [name].  PROAIR HFA 108 (90 Base) MCG/ACT inhaler, INHALE 1 TO 2 PUFFS PO Q 4 TO 6 HOURS PRN FOR WHEEZING   Current Outpatient Medications (Analgesics):  .  ibuprofen (ADVIL) 200 MG tablet, Take 400 mg by mouth as needed.    Current Facility-Administered Medications (Hematological):  .  cyanocobalamin ((VITAMIN B-12)) injection 1,000 mcg  Current Outpatient Medications (Other):  [name].  ALPRAZolam (XANAX) 0.5 MG tablet, Take 0.5 mg by mouth 4 (four) times daily as needed.  .  Cholecalciferol (VITAMIN D3) 1000 units CAPS, Take 1 capsule by mouth daily. .  Diclofenac Sodium (PENNSAID) 2 % SOLN, Place 1 application onto the skin 2 (two) times daily. [name].  doxycycline (VIBRA-TABS) 100 MG tablet, Take 1 tablet (100 mg total) by mouth 2 (two) times daily. .  Multiple Vitamin (MULTIVITAMIN)  tablet, Take 1 tablet by mouth daily. [name].  PREVIDENT 5000 SENSITIVE 1.1-5 % PSTE, See admin instructions. .  Vitamin D, Ergocalciferol, (DRISDOL) 1.25 MG (50000 UT) CAPS capsule, Take 50,000 Units by mouth every 7 (seven) days. Pt states that she will take this dose x 4 days, then switch to OTC supplement of 2000 iu qd .  vortioxetine HBr (TRINTELLIX) 10 MG TABS tablet, Take 10 mg by mouth daily. Take 1 tablet po qd.  Pt states as 12/29 her dose is being increased to 20 mg qd     Past medical history, social, surgical and family history all reviewed in electronic medical record.  No pertanent information unless stated regarding to the chief complaint.   Review of Systems:  No headache, visual changes, nausea, vomiting, diarrhea, constipation, dizziness, abdominal pain, skin rash, fevers, chills, night sweats, weight loss, swollen lymph nodes, body aches, joint swelling,  chest pain, shortness of breath, mood changes.  Positive muscle aches  Objective  Blood pressure (!) 148/90, pulse (!) 101, height 5\' 5"  (1.651 m), weight 270 lb (122.5 kg), SpO2 97 %.    General: No apparent distress alert and oriented x3 mood and affect normal, dressed appropriately.  HEENT: Pupils equal, extraocular movements intact  Respiratory: Patient's speak in full sentences and does not appear short of breath  Cardiovascular: Trace lower extremity edema, non tender, no erythema  Skin: Warm dry intact with no signs of infection or rash on extremities or on axial skeleton.  Abdomen: Soft nontender  Neuro: Cranial nerves II through XII are intact, neurovascularly intact in all extremities with 2+ DTRs and 2+ pulses.  Lymph: No lymphadenopathy of posterior or anterior cervical chain or axillae bilaterally.  Gait antalgic MSK:  tender with full range of motion and good stability and symmetric strength and tone of shoulders, elbows, wrist, hipand ankles bilaterally.  Knee: Bilateral valgus deformity noted.  Abnormal  thigh to calf ratio.  Tender to palpation over medial and PF joint line.  ROM full in flexion and extension and lower leg rotation. instability with valgus force.  painful patellar compression. Patellar glide with moderate crepitus. Patellar and quadriceps tendons unremarkable. Hamstring and quadriceps strength is normal.   After informed written and verbal consent, patient was seated on exam table. Right knee was prepped with alcohol swab and utilizing anterolateral approach, patient's right knee space was injected with 4:1  marcaine methylprednisolone 80mg /dL. Patient tolerated the procedure well without immediate complications.  After informed written and verbal consent, patient was seated on exam table. Left knee was prepped with alcohol swab and utilizing anterolateral approach, patient's left knee  space was injected with 4:1  marcaine methylprednisolone 80mg /dL. Patient tolerated the procedure well without immediate complications.     Impression and Recommendations:     This case required medical decision making of moderate complexity. The above documentation has been reviewed and is accurate and complete Lyndal Pulley, DO       Note: This dictation was prepared with Dragon dictation along with smaller phrase technology. Any transcriptional errors that result from this process are unintentional.

## 2019-03-18 NOTE — Progress Notes (Signed)
[name] [address] [place], [place] [address] Phone: [phone] Subjective:   Aimee [name], am serving as a scribe for Dr. Hulan Saas.  I'm seeing this patient by the request  of:    CC: Bilateral knee pain follow-up  MIW:[id number]   02/05/2019 Bilateral injections given today.  Could be a candidate for Visco supplementation.  Patient wants to avoid surgical intervention.  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  Encourage weight loss, topical anti-inflammatories icing regimen and vitamin D supplementation.  Patient will follow-up again 6 weeks  Update 03/19/2019 Aimee [name] is a 58 y.o. female coming in with complaint of bilateral knee pain. Injections lasted a couple of weeks. Still having swelling in ankle.  Patient states that the pain is unrelenting symptoms.  Can affect daily activities.  Patient still wants to avoid any type of surgical intervention.     Past Medical History:  Diagnosis Date  . Anxiety   . Arthritis   . Depression   . GERD (gastroesophageal reflux disease)   . Hypertension    Past Surgical History:  Procedure Laterality Date  . ABDOMINAL HYSTERECTOMY  2004  . CESAREAN SECTION  2003  . KNEE ARTHROSCOPY Right 2013   Social History   Socioeconomic History  . Marital status: Married    Spouse name: Not on file  . Number of children: Not on file  . Years of education: Not on file  . Highest education level: Not on file  Occupational History  . Not on file  Social Needs  . Financial resource strain: Not on file  . Food insecurity    Worry: Not on file    Inability: Not on file  . Transportation needs    Medical: Not on file    Non-medical: Not on file  Tobacco Use  . Smoking status: Never Smoker  . Smokeless tobacco: Never Used  Substance and Sexual Activity  . Alcohol use: [name]  . Drug use: [name]  . Sexual activity: Yes    Birth  control/protection: Surgical  Lifestyle  . Physical activity    Days per week: Not on file    Minutes per session: Not on file  . Stress: Not on file  Relationships  . Social Herbalist on phone: Not on file    Gets together: Not on file    Attends religious service: Not on file    Active member of club or organization: Not on file    Attends meetings of clubs or organizations: Not on file    Relationship status: Not on file  Other Topics Concern  . Not on file  Social History Narrative   Goes by [employer]   Works for [employer] -- working there for 24 years   Married, 2 kids (75 and 59) and 1 grandson   [name] Known Allergies Family History  Problem Relation Age of Onset  . Colon cancer Mother 86  . Heart disease Father   . Hypertension Father   . Heart attack Father 72  . Stroke Father   . Diabetes Sister   . Breast cancer Sister 56  . Heart attack Sister       Current Outpatient Medications (Cardiovascular):  .  amLODipine (NORVASC) 10 MG tablet, TAKE 1 TABLET(10 MG) BY MOUTH DAILY   Current Outpatient Medications (Respiratory):  .  budesonide-formoterol (SYMBICORT) 80-4.5 MCG/ACT  inhaler, Inhale 2 puffs into the lungs 2 (two) times daily. .  fluticasone (FLONASE) 50 MCG/ACT nasal spray, Place 2 sprays into both nostrils daily. [name].  ipratropium (ATROVENT) 0.03 % nasal spray, Place 2 sprays into both nostrils every 12 (twelve) hours. .  montelukast (SINGULAIR) 10 MG tablet, Take 1 tablet (10 mg total) by mouth at bedtime. [name].  PROAIR HFA 108 (90 Base) MCG/ACT inhaler, INHALE 1 TO 2 PUFFS PO Q 4 TO 6 HOURS PRN FOR WHEEZING   Current Outpatient Medications (Analgesics):  .  ibuprofen (ADVIL) 200 MG tablet, Take 400 mg by mouth as needed.    Current Facility-Administered Medications (Hematological):  .  cyanocobalamin ((VITAMIN B-12)) injection 1,000 mcg  Current Outpatient Medications (Other):  [name].  ALPRAZolam (XANAX) 0.5 MG tablet, Take 0.5 mg by mouth 4  (four) times daily as needed.  .  Cholecalciferol (VITAMIN D3) 1000 units CAPS, Take 1 capsule by mouth daily. .  Diclofenac Sodium (PENNSAID) 2 % SOLN, Place 1 application onto the skin 2 (two) times daily. [name].  doxycycline (VIBRA-TABS) 100 MG tablet, Take 1 tablet (100 mg total) by mouth 2 (two) times daily. .  Multiple Vitamin (MULTIVITAMIN) tablet, Take 1 tablet by mouth daily. [name].  PREVIDENT 5000 SENSITIVE 1.1-5 % PSTE, See admin instructions. .  Vitamin D, Ergocalciferol, (DRISDOL) 1.25 MG (50000 UT) CAPS capsule, Take 50,000 Units by mouth every 7 (seven) days. Pt states that she will take this dose x 4 days, then switch to OTC supplement of 2000 iu qd .  vortioxetine HBr (TRINTELLIX) 10 MG TABS tablet, Take 10 mg by mouth daily. Take 1 tablet po qd.  Pt states as 12/29 her dose is being increased to 20 mg qd     Past medical history, social, surgical and family history all reviewed in electronic medical record.  [name] pertanent information unless stated regarding to the chief complaint.   Review of Systems:  [name] headache, visual changes, nausea, vomiting, diarrhea, constipation, dizziness, abdominal pain, skin rash, fevers, chills, night sweats, weight loss, swollen lymph nodes, body aches, joint swelling, muscle aches, chest pain, shortness of breath, mood changes.   Objective  Blood pressure 118/78, pulse 99, height 5\' 5"  (1.651 m), weight 267 lb (121.1 kg), SpO2 96 %.    General: [name] apparent distress alert and oriented x3 mood and affect normal, dressed appropriately.  Obese HEENT: Pupils equal, extraocular movements intact  Respiratory: Patient's speak in full sentences and does not appear short of breath  Cardiovascular: 1+ lower extremity edema, non tender, [name] erythema  Skin: Warm dry intact with [name] signs of infection or rash on extremities or on axial skeleton.  Abdomen: Soft nontender  Neuro: Cranial nerves II through XII are intact, neurovascularly intact in all extremities with  2+ DTRs and 2+ pulses.  Lymph: [name] lymphadenopathy of posterior or anterior cervical chain or axillae bilaterally.  Gait antalgic MSK:  tender with limited range of motion and good stability and symmetric strength and tone of shoulders, elbows, wrist, hip, and ankles bilaterally.  Knee: Bilateral  valgus deformity noted. Large thigh to calf ratio.  Tender to palpation over medial and PF joint line.  Lacks 5 degrees of extension. instability with valgus force.  painful patellar compression. Patellar glide with moderate crepitus. Patellar and quadriceps tendons unremarkable. Hamstring and quadriceps strength is normal.  After informed written and verbal consent, patient was seated on exam table. Right knee was prepped with alcohol swab and utilizing anterolateral approach, patient's right knee space  was injected with 16.8 mg per 2 mL Gelsyn (sodium hyaluronate) in a prefilled syringe was injected easily into the knee through a 22-gauge needle..Patient tolerated the procedure well without immediate complications.  After informed written and verbal consent, patient was seated on exam table. Left knee was prepped with alcohol swab and utilizing anterolateral approach, patient's left knee space was injected with16.8 mg per 2 mL Gelsyn (sodium hyaluronate) in a prefilled syringe was injected easily into the knee through a 22-gauge needle..Patient tolerated the procedure well without immediate complications.   Impression and Recommendations:     This case required medical decision making of moderate complexity. The above documentation has been reviewed and is accurate and complete Judi SaaZachary M , DO       Note: This dictation was prepared with Dragon dictation along with smaller phrase technology. Any transcriptional errors that result from this process are unintentional.

## 2019-03-19 ENCOUNTER — Other Ambulatory Visit: Payer: Self-pay

## 2019-03-19 ENCOUNTER — Ambulatory Visit (INDEPENDENT_AMBULATORY_CARE_PROVIDER_SITE_OTHER): Payer: 59 | Admitting: Family Medicine

## 2019-03-19 ENCOUNTER — Encounter: Payer: Self-pay | Admitting: Family Medicine

## 2019-03-19 DIAGNOSIS — M170 Bilateral primary osteoarthritis of knee: Secondary | ICD-10-CM | POA: Diagnosis not present

## 2019-03-19 NOTE — Assessment & Plan Note (Signed)
Severe arthritis of the knees bilaterally.  Discussed with patient in great length about icing regimen and home exercises.  Patient started on viscosupplementation today.  This was first in a series of 3 injections.  Patient will follow-up again in 2 months for second in the series of 3 injections.  Continue conservative therapy otherwise.

## 2019-03-19 NOTE — Patient Instructions (Signed)
Good to see you. Started the series of injections. See you again in 2 weeks for second in the series of injections Continue icing and exercises.

## 2019-03-26 LAB — HM COLONOSCOPY

## 2019-04-01 NOTE — Progress Notes (Signed)
[name] [name], [name] [address] Phone: [phone] Subjective:   [name], am serving as a scribe for Dr. Hulan Saas.  I'm seeing this patient by the request  of:    CC:  Knee pain bilaterally   [id number]  Aimee [name] is a 58 y.o. female coming in with complaint of bilateral knee pain.  Known arthritic changes.  Here for second in a series of 3 injections for Visco supplementation.  Patient states is noticing a decrease in her pain and less swelling in her legs.      Past Medical History:  Diagnosis Date  . Anxiety   . Arthritis   . Depression   . GERD (gastroesophageal reflux disease)   . Hypertension    Past Surgical History:  Procedure Laterality Date  . ABDOMINAL HYSTERECTOMY  2004  . CESAREAN SECTION  2003  . KNEE ARTHROSCOPY Right 2013   Social History   Socioeconomic History  . Marital status: Married    Spouse name: Not on file  . Number of children: Not on file  . Years of education: Not on file  . Highest education level: Not on file  Occupational History  . Not on file  Social Needs  . Financial resource strain: Not on file  . Food insecurity    Worry: Not on file    Inability: Not on file  . Transportation needs    Medical: Not on file    Non-medical: Not on file  Tobacco Use  . Smoking status: Never Smoker  . Smokeless tobacco: Never Used  Substance and Sexual Activity  . Alcohol use: No  . Drug use: No  . Sexual activity: Yes    Birth control/protection: Surgical  Lifestyle  . Physical activity    Days per week: Not on file    Minutes per session: Not on file  . Stress: Not on file  Relationships  . Social Herbalist on phone: Not on file    Gets together: Not on file    Attends religious service: Not on file    Active member of club or organization: Not on file    Attends meetings of clubs or organizations: Not on file    Relationship status: Not on file   Other Topics Concern  . Not on file  Social History Narrative   Goes by [employer]   Works for [employer] -- working there for 24 years   Married, 2 kids (19 and 56) and 1 grandson   No Known Allergies Family History  Problem Relation Age of Onset  . Colon cancer Mother 8  . Heart disease Father   . Hypertension Father   . Heart attack Father 40  . Stroke Father   . Diabetes Sister   . Breast cancer Sister 56  . Heart attack Sister       Current Outpatient Medications (Cardiovascular):  .  amLODipine (NORVASC) 10 MG tablet, TAKE 1 TABLET(10 MG) BY MOUTH DAILY   Current Outpatient Medications (Respiratory):  .  budesonide-formoterol (SYMBICORT) 80-4.5 MCG/ACT inhaler, Inhale 2 puffs into the lungs 2 (two) times daily. .  fluticasone (FLONASE) 50 MCG/ACT nasal spray, Place 2 sprays into both nostrils daily. [name]  ipratropium (ATROVENT) 0.03 % nasal spray, Place 2 sprays into both nostrils every 12 (twelve) hours. .  montelukast (SINGULAIR) 10 MG tablet, Take 1 tablet (10 mg total) by mouth at bedtime. [name]  PROAIR  HFA 108 (90 Base) MCG/ACT inhaler, INHALE 1 TO 2 PUFFS PO Q 4 TO 6 HOURS PRN FOR WHEEZING   Current Outpatient Medications (Analgesics):  .  ibuprofen (ADVIL) 200 MG tablet, Take 400 mg by mouth as needed.    Current Facility-Administered Medications (Hematological):  .  cyanocobalamin ((VITAMIN B-12)) injection 1,000 mcg  Current Outpatient Medications (Other):  [name].  ALPRAZolam (XANAX) 0.5 MG tablet, Take 0.5 mg by mouth 4 (four) times daily as needed.  .  Cholecalciferol (VITAMIN D3) 1000 units CAPS, Take 1 capsule by mouth daily. .  Diclofenac Sodium (PENNSAID) 2 % SOLN, Place 1 application onto the skin 2 (two) times daily. [name].  doxycycline (VIBRA-TABS) 100 MG tablet, Take 1 tablet (100 mg total) by mouth 2 (two) times daily. .  Multiple Vitamin (MULTIVITAMIN) tablet, Take 1 tablet by mouth daily. [name].  PREVIDENT 5000 SENSITIVE 1.1-5 % PSTE, See admin instructions.  .  Vitamin D, Ergocalciferol, (DRISDOL) 1.25 MG (50000 UT) CAPS capsule, Take 50,000 Units by mouth every 7 (seven) days. Pt states that she will take this dose x 4 days, then switch to OTC supplement of 2000 iu qd .  vortioxetine HBr (TRINTELLIX) 10 MG TABS tablet, Take 10 mg by mouth daily. Take 1 tablet po qd.  Pt states as 12/29 her dose is being increased to 20 mg qd     Past medical history, social, surgical and family history all reviewed in electronic medical record.  No pertanent information unless stated regarding to the chief complaint.   Review of Systems:  No headache, visual changes, nausea, vomiting, diarrhea, constipation, dizziness, abdominal pain, skin rash, fevers, chills, night sweats, weight loss, swollen lymph nodes, body aches, joint swelling, , chest pain, shortness of breath, mood changes.  Positive muscle aches  Objective  Blood pressure 118/78, pulse 82, height 5\' 5"  (1.651 m), weight 264 lb (119.7 kg), SpO2 97 %.    General: No apparent distress alert and oriented x3 mood and affect normal, dressed appropriately.  HEENT: Pupils equal, extraocular movements intact  Respiratory: Patient's speak in full sentences and does not appear short of breath  Cardiovascular: No lower extremity edema, non tender, no erythema  Skin: Warm dry intact with no signs of infection or rash on extremities or on axial skeleton.  Abdomen: Soft nontender  Neuro: Cranial nerves II through XII are intact, neurovascularly intact in all extremities with 2+ DTRs and 2+ pulses.  Lymph: No lymphadenopathy of posterior or anterior cervical chain or axillae bilaterally.  Gait antalgic MSK:  tender with full range of motion and good stability and symmetric strength and tone of shoulders, elbows, wrist, hip, and ankles bilaterally.  Knee: bilateral  valgus deformity noted. Large thigh to calf ratio.  Tender to palpation over medial and PF joint line.  ROM full in flexion and extension and lower  leg rotation. instability with valgus force.  painful patellar compression. Patellar glide with moderate crepitus. Patellar and quadriceps tendons unremarkable. Hamstring and quadriceps strength is normal. Contralateral knee shows  After informed written and verbal consent, patient was seated on exam table. Right knee was prepped with alcohol swab and utilizing anterolateral approach, patient's right knee space was injected with njected with 16.8 mg per 2 mL's of Gelsyn and improved failed syringe with a 22-gauge 2 inch needle..Patient tolerated the procedure well without immediate complications.  After informed written and verbal consent, patient was seated on exam table. Left knee was prepped with alcohol swab and utilizing anterolateral approach, patient's left  knee space wawith njected with 16.8 mg per 2 mL's of Gelsyn and improved failed syringe with a 22-gauge 2 inch needls .Patient tolerated the procedure well without immediate complications.    Impression and Recommendations:     This case required medical decision making of moderate complexity. The above documentation has been reviewed and is accurate and complete Judi SaaZachary M Smith, DO       Note: This dictation was prepared with Dragon dictation along with smaller phrase technology. Any transcriptional errors that result from this process are unintentional.

## 2019-04-02 ENCOUNTER — Encounter: Payer: Self-pay | Admitting: Family Medicine

## 2019-04-02 ENCOUNTER — Ambulatory Visit: Payer: 59 | Admitting: Family Medicine

## 2019-04-02 ENCOUNTER — Other Ambulatory Visit: Payer: Self-pay

## 2019-04-02 DIAGNOSIS — M170 Bilateral primary osteoarthritis of knee: Secondary | ICD-10-CM | POA: Diagnosis not present

## 2019-04-02 NOTE — Assessment & Plan Note (Signed)
Second in a series of 3 injections given to the knees bilaterally today.  Patient will slowly hopefully be making some improvement and increase activity as tolerated.  Follow-up again in 1 week for third and final injection.

## 2019-04-09 ENCOUNTER — Encounter: Payer: Self-pay | Admitting: Family Medicine

## 2019-04-09 ENCOUNTER — Other Ambulatory Visit: Payer: Self-pay

## 2019-04-09 ENCOUNTER — Ambulatory Visit: Payer: 59 | Admitting: Family Medicine

## 2019-04-09 DIAGNOSIS — M170 Bilateral primary osteoarthritis of knee: Secondary | ICD-10-CM

## 2019-04-09 NOTE — Patient Instructions (Signed)
Good to see you  Ice is your friend Should continue to improve over the next month  Continue the exercises  See me again in 2-3 months!

## 2019-04-09 NOTE — Progress Notes (Signed)
Procedure note  Here for 3rd and final injeciton for visco for knee   Patient states      Knee: Bilateral valgus deformity noted. Large thigh to calf ratio.  Tender to palpation over medial and PF joint line.  ROM full in flexion and extension and lower leg rotation. instability with valgus force.  painful patellar compression. Patellar glide with moderate crepitus. Patellar and quadriceps tendons unremarkable. Hamstring and quadriceps strength is normal.   After informed written and verbal consent, patient was seated on exam table. Right knee was prepped with alcohol swab and utilizing anterolateral approach, patient's right knee space was injected with 16.78mL/2mL of Gelsyn (sodium hyaluronate) in a prefilled syringe was injected easily into the knee through a 22-gauge needle..Patient tolerated the procedure well without immediate complications.  After informed written and verbal consent, patient was seated on exam table. Left knee was prepped with alcohol swab and utilizing anterolateral approach, patient's left knee space was injected with 16.8 0.2 mL's of Gelsyn (sodium hyaluronate) in a prefilled syringe was injected easily into the knee through a 22-gauge needle..Patient tolerated the procedure well without immediate complications.

## 2019-04-09 NOTE — Assessment & Plan Note (Signed)
Given exercises  Will do well in long run  Discuss bracing.  As long as patient does well will follow-up with me again in 2 to 3 months

## 2019-04-12 ENCOUNTER — Encounter: Payer: Self-pay | Admitting: Physician Assistant

## 2019-05-20 ENCOUNTER — Other Ambulatory Visit: Payer: Self-pay | Admitting: Obstetrics and Gynecology

## 2019-05-20 DIAGNOSIS — R928 Other abnormal and inconclusive findings on diagnostic imaging of breast: Secondary | ICD-10-CM

## 2019-05-23 ENCOUNTER — Other Ambulatory Visit: Payer: Self-pay | Admitting: Physician Assistant

## 2019-05-27 ENCOUNTER — Other Ambulatory Visit: Payer: Self-pay

## 2019-05-27 ENCOUNTER — Ambulatory Visit
Admission: RE | Admit: 2019-05-27 | Discharge: 2019-05-27 | Disposition: A | Discharge status: Home / Self Care | Payer: 59 | Source: Ambulatory Visit | Attending: Obstetrics and Gynecology | Admitting: Obstetrics and Gynecology

## 2019-05-27 DIAGNOSIS — R928 Other abnormal and inconclusive findings on diagnostic imaging of breast: Secondary | ICD-10-CM

## 2019-06-03 ENCOUNTER — Telehealth: Payer: Self-pay | Admitting: *Deleted

## 2019-06-03 NOTE — Telephone Encounter (Signed)
Left message on voicemail to call office.  

## 2019-06-07 ENCOUNTER — Ambulatory Visit: Payer: 59 | Admitting: Physician Assistant

## 2019-06-17 DIAGNOSIS — Z1379 Encounter for other screening for genetic and chromosomal anomalies: Secondary | ICD-10-CM | POA: Insufficient documentation

## 2019-07-01 NOTE — Progress Notes (Signed)
[name] [address] [place], [place] [address] Phone: [phone] Subjective:   I Aimee [name] am serving as a Neurosurgeon for Dr. Antoine Primas.   CC: Knee pain follow-up  AJG:[id number]   .  Update 07/02/2019 Aimee [name] is a 58 y.o. female coming in with complaint of bilateral knee pain. Patient states she is feeling alright. Bilateral injections. Stiffness in the right knee.  Patient does have severe arthritic changes of the knees.  Patient was given viscosupplementation and had done fairly well for some time.  Patient states no longer having the swelling in the legs like she used to have on a regular basis.   Patient doing viscosupplementation in August 2020 last injection was April 09, 2019  Past Medical History:  Diagnosis Date  . Anxiety   . Arthritis   . Depression   . GERD (gastroesophageal reflux disease)   . Hypertension    Past Surgical History:  Procedure Laterality Date  . ABDOMINAL HYSTERECTOMY  2004  . CESAREAN SECTION  2003  . KNEE ARTHROSCOPY Right 2013   Social History   Socioeconomic History  . Marital status: Married    Spouse name: Not on file  . Number of children: Not on file  . Years of education: Not on file  . Highest education level: Not on file  Occupational History  . Not on file  Social Needs  . Financial resource strain: Not on file  . Food insecurity    Worry: Not on file    Inability: Not on file  . Transportation needs    Medical: Not on file    Non-medical: Not on file  Tobacco Use  . Smoking status: Never Smoker  . Smokeless tobacco: Never Used  Substance and Sexual Activity  . Alcohol use: No  . Drug use: No  . Sexual activity: Yes    Birth control/protection: Surgical  Lifestyle  . Physical activity    Days per week: Not on file    Minutes per session: Not on file  . Stress: Not on file  Relationships  . Social Musician on phone: Not on file    Gets together: Not on  file    Attends religious service: Not on file    Active member of club or organization: Not on file    Attends meetings of clubs or organizations: Not on file    Relationship status: Not on file  Other Topics Concern  . Not on file  Social History Narrative   Goes by [employer]   Works for [employer] -- working there for 24 years   Married, 2 kids (34 and 15) and 1 grandson   No Known Allergies Family History  Problem Relation Age of Onset  . Colon cancer Mother 47  . Heart disease Father   . Hypertension Father   . Heart attack Father 61  . Stroke Father   . Diabetes Sister   . Breast cancer Sister 11  . Heart attack Sister       Current Outpatient Medications (Cardiovascular):  .  amLODipine (NORVASC) 10 MG tablet, TAKE 1 TABLET(10 MG) BY MOUTH DAILY   Current Outpatient Medications (Respiratory):  .  budesonide-formoterol (SYMBICORT) 80-4.5 MCG/ACT inhaler, Inhale 2 puffs into the lungs 2 (two) times daily. .  fluticasone (FLONASE) 50 MCG/ACT nasal spray, Place 2 sprays into both nostrils daily. [name]  ipratropium (ATROVENT) 0.03 % nasal spray, Place 2 sprays into both  nostrils every 12 (twelve) hours. .  montelukast (SINGULAIR) 10 MG tablet, Take 1 tablet (10 mg total) by mouth at bedtime. [name]  PROAIR HFA 108 (90 Base) MCG/ACT inhaler, INHALE 1 TO 2 PUFFS PO Q 4 TO 6 HOURS PRN FOR WHEEZING   Current Outpatient Medications (Analgesics):  .  ibuprofen (ADVIL) 200 MG tablet, Take 400 mg by mouth as needed.    Current Facility-Administered Medications (Hematological):  .  cyanocobalamin ((VITAMIN B-12)) injection 1,000 mcg  Current Outpatient Medications (Other):  [name]  ALPRAZolam (XANAX) 0.5 MG tablet, Take 0.5 mg by mouth 4 (four) times daily as needed.  .  Cholecalciferol (VITAMIN D3) 1000 units CAPS, Take 1 capsule by mouth daily. .  Diclofenac Sodium (PENNSAID) 2 % SOLN, Place 1 application onto the skin 2 (two) times daily. [name]  doxycycline (VIBRA-TABS) 100 MG tablet,  Take 1 tablet (100 mg total) by mouth 2 (two) times daily. .  Multiple Vitamin (MULTIVITAMIN) tablet, Take 1 tablet by mouth daily. [name]  PREVIDENT 5000 SENSITIVE 1.1-5 % PSTE, See admin instructions. .  Vitamin D, Ergocalciferol, (DRISDOL) 1.25 MG (50000 UT) CAPS capsule, Take 50,000 Units by mouth every 7 (seven) days. Pt states that she will take this dose x 4 days, then switch to OTC supplement of 2000 iu qd .  vortioxetine HBr (TRINTELLIX) 10 MG TABS tablet, Take 10 mg by mouth daily. Take 1 tablet po qd.  Pt states as 12/29 her dose is being increased to 20 mg qd     Past medical history, social, surgical and family history all reviewed in electronic medical record.  No pertanent information unless stated regarding to the chief complaint.   Review of Systems:  No headache, visual changes, nausea, vomiting, diarrhea, constipation, dizziness, abdominal pain, skin rash, fevers, chills, night sweats, weight loss, swollen lymph nodes, body aches, joint swelling,, chest pain, shortness of breath, mood changes.  Positive muscle aches  Objective  Blood pressure (!) 154/90, pulse 81, height 5\' 5"  (1.651 m), weight 265 lb (120.2 kg), SpO2 97 %.    General: No apparent distress alert and oriented x3 mood and affect normal, dressed appropriately.  HEENT: Pupils equal, extraocular movements intact  Respiratory: Patient's speak in full sentences and does not appear short of breath  Cardiovascular: No lower extremity edema, non tender, no erythema  Skin: Warm dry intact with no signs of infection or rash on extremities or on axial skeleton.  Abdomen: Soft nontender  Neuro: Cranial nerves II through XII are intact, neurovascularly intact in all extremities with 2+ DTRs and 2+ pulses.  Lymph: No lymphadenopathy of posterior or anterior cervical chain or axillae bilaterally.  Gait antalgic MSK:  tender with limited range of motion and good stability and symmetric strength and tone of shoulders, elbows,  wrist, hip and ankles bilaterally.  Knee: Bilateral valgus deformity noted. Large thigh to calf ratio.  Tender to palpation over medial and PF joint line.  ROM full in flexion and extension and lower leg rotation. instability with valgus force.  painful patellar compression. Patellar glide with moderate crepitus. Patellar and quadriceps tendons unremarkable. Hamstring and quadriceps strength is normal.  After informed written and verbal consent, patient was seated on exam table. Right knee was prepped with alcohol swab and utilizing anterolateral approach, patient's right knee space was injected with 4:1  marcaine 0.5%: Kenalog 40mg /dL. Patient tolerated the procedure well without immediate complications.  After informed written and verbal consent, patient was seated on exam table. Left knee was prepped  with alcohol swab and utilizing anterolateral approach, patient's left knee space was injected with 4:1  marcaine 0.5%: Kenalog 40mg /dL. Patient tolerated the procedure well without immediate complications.   Impression and Recommendations:     This case required medical decision making of moderate complexity. The above documentation has been reviewed and is accurate and complete Judi SaaZachary M , DO       Note: This dictation was prepared with Dragon dictation along with smaller phrase technology. Any transcriptional errors that result from this process are unintentional.

## 2019-07-02 ENCOUNTER — Other Ambulatory Visit: Payer: Self-pay

## 2019-07-02 ENCOUNTER — Encounter: Payer: Self-pay | Admitting: Family Medicine

## 2019-07-02 ENCOUNTER — Ambulatory Visit (INDEPENDENT_AMBULATORY_CARE_PROVIDER_SITE_OTHER): Payer: 59 | Admitting: Family Medicine

## 2019-07-02 DIAGNOSIS — M170 Bilateral primary osteoarthritis of knee: Secondary | ICD-10-CM

## 2019-07-02 NOTE — Assessment & Plan Note (Signed)
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  Patient has responded fairly well to the viscosupplementation.  Discussed icing regimen and home exercise, discussed topical anti-inflammatories.  Patient is to increase activity slowly.  Follow-up again in 12 weeks

## 2019-07-02 NOTE — Patient Instructions (Signed)
See you after Feburary 25th

## 2019-07-24 ENCOUNTER — Other Ambulatory Visit: Payer: Self-pay | Admitting: *Deleted

## 2019-07-24 MED ORDER — AMLODIPINE BESYLATE 10 MG PO TABS: ORAL_TABLET | ORAL | 0 refills | Status: DC

## 2019-09-02 ENCOUNTER — Other Ambulatory Visit: Payer: Self-pay | Admitting: Physician Assistant

## 2019-09-30 ENCOUNTER — Other Ambulatory Visit: Payer: Self-pay | Admitting: Physician Assistant

## 2019-10-14 ENCOUNTER — Ambulatory Visit: Payer: 59 | Admitting: Family Medicine

## 2019-10-24 ENCOUNTER — Ambulatory Visit: Payer: 59 | Attending: Family

## 2019-10-24 DIAGNOSIS — Z23 Encounter for immunization: Secondary | ICD-10-CM

## 2019-10-24 NOTE — Progress Notes (Signed)
   Covid-19 Vaccination Clinic  Name:  Aimee [name]    MRN: [id number] DOB: 03-19-1961  10/24/2019  [name] was observed post Covid-19 immunization for 15 minutes without incident. She was provided with Vaccine Information Sheet and instruction to access the V-Safe system.   [name] was instructed to call 911 with any severe reactions post vaccine: [name] Difficulty breathing  . Swelling of face and throat  . A fast heartbeat  . A bad rash all over body  . Dizziness and weakness   Immunizations Administered    Name Date Dose VIS Date Route   Moderna COVID-19 Vaccine 10/24/2019 10:24 AM 0.5 mL 07/16/2019 Intramuscular   Manufacturer: Moderna   Lot: [id number]   NDC: 41364-383-77

## 2019-10-28 ENCOUNTER — Other Ambulatory Visit: Payer: Self-pay | Admitting: Physician Assistant

## 2019-10-31 ENCOUNTER — Ambulatory Visit: Payer: 59 | Admitting: Family Medicine

## 2019-11-05 NOTE — Progress Notes (Signed)
I acted as a Neurosurgeon for [employer], PA-C Corky Mull, LPN   Subjective:    Aimee [name] is a 59 y.o. female and is here for a comprehensive physical exam.   HPI  There are no preventive care reminders to display for this patient.  Acute Concerns: Abdominal wall mass -- has had a mass on the R side of her abdomen for years. Stable, not tender or bothersome, would like to know what it is. Ear pain -- intermittent R ear pain that causes swelling to behind her ear, especially at night. Does have chronic allergies. Happening consistently and is wondering what might be causing it.  Chronic Issues: Depression and anxiety -- currently on prozac 40 mg, sees Dr. Evelene Croon for this HTN -- Currently taking Norvasc 10 mg. At home blood pressure readings are: overall 128-135/80-90. Patient denies chest pain, SOB, blurred vision, dizziness, unusual headaches, lower leg swelling. Patient is compliant with medication. Denies excessive caffeine intake, stimulant usage, excessive alcohol intake, or increase in salt consumption. Allergies -- uses singulair 10 mg nightly; doesn't use inhalers regularly Vit D deficiency -- takes 1000 IU daily  Health Maintenance: Immunizations -- UTD Colonoscopy -- UTD, due 03/2024 Mammogram -- UTD, due 05/2020  PAP -- N/A Hysterectomy Bone Density -- N/A Diet -- stopped sodas, trying to drink more water, trying to get back into eating healthier Sleep habits -- overall okay Exercise -- none Weight -- Weight: 267 lb 4 oz (121.2 kg)  Mood -- stable Weight history: Wt Readings from Last 10 Encounters:  11/06/19 267 lb 4 oz (121.2 kg)  07/02/19 265 lb (120.2 kg)  04/02/19 264 lb (119.7 kg)  03/19/19 267 lb (121.1 kg)  02/05/19 270 lb (122.5 kg)  10/30/18 266 lb 8 oz (120.9 kg)  10/24/18 266 lb 8 oz (120.9 kg)  10/10/18 270 lb (122.5 kg)  09/18/18 267 lb 6.4 oz (121.3 kg)  07/16/18 268 lb (121.6 kg)   No LMP recorded. Patient has had a  hysterectomy. Alcohol use: none Tobacco use: none  Depression screen PHQ 2/9 11/06/2019  Decreased Interest 1  Down, Depressed, Hopeless 1  PHQ - 2 Score 2  Altered sleeping 2  Tired, decreased energy 3  Change in appetite 3  Feeling bad or failure about yourself  0  Trouble concentrating 0  Moving slowly or fidgety/restless 0  Suicidal thoughts 0  PHQ-9 Score 10  Difficult doing work/chores Somewhat difficult     Other providers/specialists: Patient Care Team: [name], [place] as PCP - General (Physician Assistant)    PMHx, SurgHx, SocialHx, Medications, and Allergies were reviewed in the Visit Navigator and updated as appropriate.   Past Medical History:  Diagnosis Date  . Anxiety   . Arthritis   . Depression   . GERD (gastroesophageal reflux disease)   . Hypertension      Past Surgical History:  Procedure Laterality Date  . ABDOMINAL HYSTERECTOMY  2004  . CESAREAN SECTION  2003  . KNEE ARTHROSCOPY Right 2013     Family History  Problem Relation Age of Onset  . Colon cancer Mother 65  . Heart disease Father   . Hypertension Father   . Heart attack Father 34  . Stroke Father   . Diabetes Sister   . Breast cancer Sister 80  . Heart attack Sister     Social History   Tobacco Use  . Smoking status: Never Smoker  . Smokeless tobacco: Never Used  Substance Use Topics  .  Alcohol use: No  . Drug use: No    Review of Systems:   Review of Systems  Constitutional: Negative for chills, fever, malaise/fatigue and weight loss.  HENT: Negative for hearing loss, sinus pain and sore throat.   Eyes: Negative for blurred vision.  Respiratory: Negative for cough and shortness of breath.   Cardiovascular: Negative for chest pain, palpitations and leg swelling.  Gastrointestinal: Negative for abdominal pain, constipation, diarrhea, heartburn, nausea and vomiting.  Genitourinary: Negative for dysuria, frequency and urgency.  Musculoskeletal: Negative for  back pain, myalgias and neck pain.  Skin: Negative for itching and rash.  Neurological: Negative for dizziness, tingling, seizures, loss of consciousness and headaches.  Endo/Heme/Allergies: Negative for polydipsia.  Psychiatric/Behavioral: Positive for depression. The patient is nervous/anxious.   All other systems reviewed and are negative.   Objective:   BP 130/84 (BP Location: Left Arm, Patient Position: Sitting, Cuff Size: Large)   Pulse 81   Temp 98.2 F (36.8 C) (Temporal)   Ht 5' 6.5" (1.689 m)   Wt 267 lb 4 oz (121.2 kg)   SpO2 96%   BMI 42.49 kg/m  Body mass index is 42.49 kg/m.   General Appearance:    Alert, cooperative, no distress, appears stated age  Head:    Normocephalic, without obvious abnormality, atraumatic  Eyes:    PERRL, conjunctiva/corneas clear, EOM's intact, fundi    benign, both eyes  Ears:    Normal L TM's and external ear canals, both ears Right TM with cerumen impaction;  Nose:   Nares normal, septum midline, mucosa normal, no drainage    or sinus tenderness  Throat:   Lips, mucosa, and tongue normal; teeth and gums normal  Neck:   Supple, symmetrical, trachea midline, no adenopathy;    thyroid:  no enlargement/tenderness/nodules; no carotid   bruit or JVD  Back:     Symmetric, no curvature, ROM normal, no CVA tenderness  Lungs:     Clear to auscultation bilaterally, respirations unlabored  Chest Wall:    No tenderness or deformity   Heart:    Regular rate and rhythm, S1 and S2 normal, no murmur, rub or gallop  Breast Exam:    Deferred  Abdomen:     Soft, non-tender, bowel sounds active all four quadrants,  no organomegaly Large dense mass to R side of abdomen without TTP; no fluctuance or induration  Genitalia:    Normal female without lesion, discharge or tenderness  Extremities:   Extremities normal, atraumatic, no cyanosis or edema  Pulses:   2+ and symmetric all extremities  Skin:   Skin color, texture, turgor normal, no rashes or  lesions  Lymph nodes:   Cervical, supraclavicular, and axillary nodes normal  Neurologic:   CNII-XII intact, normal strength, sensation and reflexes    throughout     Assessment/Plan:   Dallana was seen today for annual exam.  Diagnoses and all orders for this visit:  Encounter for general adult medical examination with abnormal findings Today patient counseled on age appropriate routine health concerns for screening and prevention, each reviewed and up to date or declined. Immunizations reviewed and up to date or declined. Labs ordered and reviewed. Risk factors for depression reviewed and negative. Hearing function and visual acuity are intact. ADLs screened and addressed as needed. Functional ability and level of safety reviewed and appropriate. Education, counseling and referrals performed based on assessed risks today. Patient provided with a copy of personalized plan for preventive services.  Depression, major, single  episode, moderate (HCC); Anxiety Managed by Dr. Evelene Croon, overall stable. Denies SI/HI. -     CBC with Differential/Platelet -     Comprehensive metabolic panel  Allergy, initial encounter Currently well controlled with singulair. Uses inhalers prn. Follow-up as needed.  Encounter for lipid screening for cardiovascular disease -     Lipid panel  Vitamin D deficiency -     VITAMIN D 25 Hydroxy (Vit-D Deficiency, Fractures)  Obesity, unspecified classification, unspecified obesity type, unspecified whether serious comorbidity present Encouraged movement -- work on getting active. Continue to work on Engineer, materials. -     Hemoglobin A1c  Abdominal wall mass Likely lipoma however patient would like u/s for further evaluation. This will be ordered.  Otalgia of right ear Suspect possible intermittent ETD, however patient would like ENT for further evaluation. Will likely need ear lavage as well.      Well Adult Exam: Labs ordered: Yes. Patient counseling  was done. See below for items discussed. Discussed the patient's BMI.  The BMI is not in the acceptable range; BMI management plan is completed Follow up in one year. Breast cancer screening: UTD. Cervical cancer screening: n/a   Patient Counseling: [x]    Nutrition: Stressed importance of moderation in sodium/caffeine intake, saturated fat and cholesterol, caloric balance, sufficient intake of fresh fruits, vegetables, fiber, calcium, iron, and 1 mg of folate supplement per day (for females capable of pregnancy).  [x]    Stressed the importance of regular exercise.   [x]    Substance Abuse: Discussed cessation/primary prevention of tobacco, alcohol, or other drug use; driving or other dangerous activities under the influence; availability of treatment for abuse.   [x]    Injury prevention: Discussed safety belts, safety helmets, smoke detector, smoking near bedding or upholstery.   [x]    Sexuality: Discussed sexually transmitted diseases, partner selection, use of condoms, avoidance of unintended pregnancy  and contraceptive alternatives.  [x]    Dental health: Discussed importance of regular tooth brushing, flossing, and dental visits.  [x]    Health maintenance and immunizations reviewed. Please refer to Health maintenance section.   CMA or LPN served as scribe during this visit. History, Physical, and Plan performed by medical provider. The above documentation has been reviewed and is accurate and complete.   , PA-C [facility]

## 2019-11-06 ENCOUNTER — Encounter: Payer: Self-pay | Admitting: Physician Assistant

## 2019-11-06 ENCOUNTER — Ambulatory Visit (INDEPENDENT_AMBULATORY_CARE_PROVIDER_SITE_OTHER): Payer: 59 | Admitting: Family Medicine

## 2019-11-06 ENCOUNTER — Encounter: Payer: Self-pay | Admitting: Family Medicine

## 2019-11-06 ENCOUNTER — Ambulatory Visit (INDEPENDENT_AMBULATORY_CARE_PROVIDER_SITE_OTHER): Payer: 59 | Admitting: Physician Assistant

## 2019-11-06 ENCOUNTER — Ambulatory Visit (INDEPENDENT_AMBULATORY_CARE_PROVIDER_SITE_OTHER): Payer: 59

## 2019-11-06 ENCOUNTER — Other Ambulatory Visit: Payer: Self-pay

## 2019-11-06 VITALS — BP 130/84 | HR 81 | Temp 98.2°F | Ht 66.5 in | Wt 267.2 lb

## 2019-11-06 VITALS — BP 124/84 | HR 94 | Ht 66.5 in | Wt 267.0 lb

## 2019-11-06 DIAGNOSIS — H9201 Otalgia, right ear: Secondary | ICD-10-CM

## 2019-11-06 DIAGNOSIS — E669 Obesity, unspecified: Secondary | ICD-10-CM | POA: Diagnosis not present

## 2019-11-06 DIAGNOSIS — Z0001 Encounter for general adult medical examination with abnormal findings: Secondary | ICD-10-CM | POA: Diagnosis not present

## 2019-11-06 DIAGNOSIS — Z13220 Encounter for screening for lipoid disorders: Secondary | ICD-10-CM

## 2019-11-06 DIAGNOSIS — R222 Localized swelling, mass and lump, trunk: Secondary | ICD-10-CM

## 2019-11-06 DIAGNOSIS — M542 Cervicalgia: Secondary | ICD-10-CM

## 2019-11-06 DIAGNOSIS — M170 Bilateral primary osteoarthritis of knee: Secondary | ICD-10-CM | POA: Diagnosis not present

## 2019-11-06 DIAGNOSIS — F321 Major depressive disorder, single episode, moderate: Secondary | ICD-10-CM

## 2019-11-06 DIAGNOSIS — E559 Vitamin D deficiency, unspecified: Secondary | ICD-10-CM | POA: Diagnosis not present

## 2019-11-06 DIAGNOSIS — M5030 Other cervical disc degeneration, unspecified cervical region: Secondary | ICD-10-CM | POA: Diagnosis not present

## 2019-11-06 DIAGNOSIS — Z136 Encounter for screening for cardiovascular disorders: Secondary | ICD-10-CM

## 2019-11-06 DIAGNOSIS — F419 Anxiety disorder, unspecified: Secondary | ICD-10-CM

## 2019-11-06 DIAGNOSIS — T7840XA Allergy, unspecified, initial encounter: Secondary | ICD-10-CM | POA: Diagnosis not present

## 2019-11-06 LAB — COMPREHENSIVE METABOLIC PANEL
ALT: 29 U/L (ref 0–35)
AST: 22 U/L (ref 0–37)
Albumin: 4.3 g/dL (ref 3.5–5.2)
Alkaline Phosphatase: 75 U/L (ref 39–117)
BUN: 9 mg/dL (ref 6–23)
CO2: 31 mEq/L (ref 19–32)
Calcium: 9.5 mg/dL (ref 8.4–10.5)
Chloride: 102 mEq/L (ref 96–112)
Creatinine, Ser: 0.75 mg/dL (ref 0.40–1.20)
GFR: 96.24 mL/min (ref >60.00)
Glucose, Bld: 98 mg/dL (ref 70–99)
Potassium: 4.2 mEq/L (ref 3.5–5.1)
Sodium: 141 mEq/L (ref 135–145)
Total Bilirubin: 0.4 mg/dL (ref 0.2–1.2)
Total Protein: 7.4 g/dL (ref 6.0–8.3)

## 2019-11-06 LAB — LIPID PANEL
Cholesterol: 177 mg/dL (ref 0–200)
HDL: 52.9 mg/dL (ref >39.00)
LDL Cholesterol: 111 mg/dL — ABNORMAL HIGH (ref 0–99)
NonHDL: 124.23
Total CHOL/HDL Ratio: 3
Triglycerides: 65 mg/dL (ref 0.0–149.0)
VLDL: 13 mg/dL (ref 0.0–40.0)

## 2019-11-06 LAB — CBC WITH DIFFERENTIAL/PLATELET
Basophils Absolute: 0 10*3/uL (ref 0.0–0.1)
Basophils Relative: 0.3 % (ref 0.0–3.0)
Eosinophils Absolute: 0.1 10*3/uL (ref 0.0–0.7)
Eosinophils Relative: 1.7 % (ref 0.0–5.0)
HCT: 38 % (ref 36.0–46.0)
Hemoglobin: 12 g/dL (ref 12.0–15.0)
Lymphocytes Relative: 30 % (ref 12.0–46.0)
Lymphs Abs: 1.9 10*3/uL (ref 0.7–4.0)
MCHC: 31.5 g/dL (ref 30.0–36.0)
MCV: 78.7 fl (ref 78.0–100.0)
Monocytes Absolute: 0.5 10*3/uL (ref 0.1–1.0)
Monocytes Relative: 8.4 % (ref 3.0–12.0)
Neutro Abs: 3.8 10*3/uL (ref 1.4–7.7)
Neutrophils Relative %: 59.6 % (ref 43.0–77.0)
Platelets: 322 10*3/uL (ref 150.0–400.0)
RBC: 4.83 Mil/uL (ref 3.87–5.11)
RDW: 16.1 % — ABNORMAL HIGH (ref 11.5–15.5)
WBC: 6.4 10*3/uL (ref 4.0–10.5)

## 2019-11-06 LAB — HEMOGLOBIN A1C: Hgb A1c MFr Bld: 5.5 % (ref 4.6–6.5)

## 2019-11-06 LAB — VITAMIN D 25 HYDROXY (VIT D DEFICIENCY, FRACTURES): VITD: 28.67 ng/mL — ABNORMAL LOW (ref 30.00–100.00)

## 2019-11-06 MED ORDER — GABAPENTIN 100 MG PO CAPS: 200.0000 mg | ORAL_CAPSULE | Freq: Every day | ORAL | 0 refills | Status: DC

## 2019-11-06 NOTE — Assessment & Plan Note (Addendum)
Chronic problem with exacerbation.  Worsening pain again.  Patient given steroid injections.  We will get approval for viscosupplementation with icing regimen and home exercise, which activities to do which wants to avoid.  Patient will follow up with me again in 4 to 8 weeks also discussed medication management including topical anti-inflammatories that were prescribed

## 2019-11-06 NOTE — Patient Instructions (Addendum)
Gabapentin 200mg  at night Exercises 3x a week Injected both knees today Will get approval for gel Xray today See me in 3 weeks

## 2019-11-06 NOTE — Progress Notes (Signed)
[name] [address] [place] [address] Phone: [phone] Subjective:   Aimee [name], am serving as a scribe for Dr. Antoine Primas. This visit occurred during the SARS-CoV-2 public health emergency.  Safety protocols were in place, including screening questions prior to the visit, additional usage of staff PPE, and extensive cleaning of exam room while observing appropriate contact time as indicated for disinfecting solutions.   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  Patient has responded fairly well to the viscosupplementation.  Discussed icing regimen and home exercise, discussed topical anti-inflammatories.  Patient is to increase activity slowly.  Follow-up again in 12 weeks  Update 11/06/2019 Aimee [name] is a 59 y.o. female coming in with complaint of bilateral knee pain. Patient states that her knee pain has been increasing. Is here for injections.  Patient is having worsening pain again.  Last injections were in November.  Patient was hoping to have viscosupplementation but then seemed to do relatively well and it has been 4 months intervals since we have seen patient.  Has been doing well but now having worsening pain that is affecting daily activities.  Also having cervical spine pain in the traps. Pain with rotation. Pain can radiate into shoulders.  Patient is having radiation into the hands.  Patient states that actually there is almost constant numbness in her right side pinky finger.  Has not noticed any weakness.  Severe enough that can wake her up at night     Past Medical History:  Diagnosis Date  . Anxiety   . Arthritis   . Depression   . GERD (gastroesophageal reflux disease)   . Hypertension    Past Surgical History:    Procedure Laterality Date  . ABDOMINAL HYSTERECTOMY  2004  . CESAREAN SECTION  2003  . KNEE ARTHROSCOPY Right 2013   Social History   Socioeconomic History  . Marital status: Married    Spouse name: Not on file  . Number of children: Not on file  . Years of education: Not on file  . Highest education level: Not on file  Occupational History  . Not on file  Tobacco Use  . Smoking status: Never Smoker  . Smokeless tobacco: Never Used  Substance and Sexual Activity  . Alcohol use: No  . Drug use: No  . Sexual activity: Yes    Birth control/protection: Surgical  Other Topics Concern  . Not on file  Social History Narrative   Goes by [employer] for [employer] -- working there for 24 years   Married, 2 kids (34 and 15) and 1 grandson   Social Determinants of Corporate investment banker Strain:   . Difficulty of Paying Living Expenses:   Food Insecurity:   . Worried About Programme researcher, broadcasting/film/video in the Last Year:   . Barista in the Last Year:   Transportation Needs:   . Freight forwarder (Medical):   [name] Lack of Transportation (Non-Medical):   Physical Activity:   . Days of Exercise per Week:   . Minutes of Exercise per Session:   Stress:   . Feeling of Stress :   Social Connections:   . Frequency of Communication with Friends and Family:   . Frequency of  Social Gatherings with Friends and Family:   . Attends Religious Services:   . Active Member of Clubs or Organizations:   . Attends Archivist Meetings:   [name] Marital Status:    No Known Allergies Family History  Problem Relation Age of Onset  . Colon cancer Mother 74  . Heart disease Father   . Hypertension Father   . Heart attack Father 43  . Stroke Father   . Diabetes Sister   . Breast cancer Sister 73  . Heart attack Sister      Current Outpatient Medications (Cardiovascular):  .  amLODipine (NORVASC) 10 MG tablet, TAKE 1 TABLET(10 MG) BY MOUTH DAILY  Current Outpatient  Medications (Respiratory):  .  budesonide-formoterol (SYMBICORT) 80-4.5 MCG/ACT inhaler, Inhale 2 puffs into the lungs 2 (two) times daily. [name]  ipratropium (ATROVENT) 0.03 % nasal spray, Place 2 sprays into both nostrils every 12 (twelve) hours. .  montelukast (SINGULAIR) 10 MG tablet, Take 1 tablet (10 mg total) by mouth at bedtime. [name]  PROAIR HFA 108 (90 Base) MCG/ACT inhaler, INHALE 1 TO 2 PUFFS PO Q 4 TO 6 HOURS PRN FOR WHEEZING  Current Outpatient Medications (Analgesics):  .  ibuprofen (ADVIL) 200 MG tablet, Take 400 mg by mouth as needed.   Current Outpatient Medications (Other):  [name]  ALPRAZolam (XANAX) 0.5 MG tablet, Take 0.5 mg by mouth 4 (four) times daily as needed.  .  Cholecalciferol (VITAMIN D3) 1000 units CAPS, Take 1 capsule by mouth daily. .  Diclofenac Sodium (PENNSAID) 2 % SOLN, Place 1 application onto the skin 2 (two) times daily. [name]  FLUoxetine (PROZAC) 40 MG capsule, fluoxetine 40 mg capsule  TAKE 1 CAPSULE BY MOUTH EVERY DAY .  mirabegron ER (MYRBETRIQ) 50 MG TB24 tablet, Take 50 mg by mouth daily. .  Multiple Vitamin (MULTIVITAMIN) tablet, Take 1 tablet by mouth daily. [name]  PREVIDENT 5000 SENSITIVE 1.1-5 % PSTE, See admin instructions. .  gabapentin (NEURONTIN) 100 MG capsule, Take 2 capsules (200 mg total) by mouth at bedtime.   Reviewed prior external information including notes and imaging from  primary care provider As well as notes that were available from care everywhere and other healthcare systems.  Past medical history, social, surgical and family history all reviewed in electronic medical record.  No pertanent information unless stated regarding to the chief complaint.   Review of Systems:  No headache, visual changes, nausea, vomiting, diarrhea, constipation, dizziness, abdominal pain, skin rash, fevers, chills, night sweats, weight loss, swollen lymph nodes, body aches, joint swelling, chest pain, shortness of breath, mood changes. POSITIVE muscle  aches  Objective  Blood pressure 124/84, pulse 94, height 5' 6.5" (1.689 m), weight 267 lb (121.1 kg), SpO2 98 %.   General: No apparent distress alert and oriented x3 mood and affect normal, dressed appropriately.  Obese HEENT: Pupils equal, extraocular movements intact  Respiratory: Patient's speak in full sentences and does not appear short of breath  Cardiovascular: 1+ lower extremity edema, non tender, no erythema  Neuro: Cranial nerves II through XII are intact, neurovascularly intact in all extremities with 2+ DTRs and 2+ pulses.  Gait antalgic MSK: Knee: Bilateral valgus deformity noted.  Abnormal thigh to calf ratio.  Tender to palpation over medial and PF joint line.  ROM full in flexion and extension and lower leg rotation. instability with valgus force.  painful patellar compression. Patellar glide with moderate crepitus. Patellar and quadriceps tendons unremarkable. Hamstring and quadriceps strength is normal.  Neck exam  does have loss of lordosis.  Positive Spurling's on the right side in the C8 distribution.  Good strength though noted of the hands bilaterally.  Deep tendon reflexes do appear to be intact.  After informed written and verbal consent, patient was seated on exam table. Right knee was prepped with alcohol swab and utilizing anterolateral approach, patient's right knee space was injected with 4:1  marcaine 0.5%: Kenalog 40mg /dL. Patient tolerated the procedure well without immediate complications.  After informed written and verbal consent, patient was seated on exam table. Left knee was prepped with alcohol swab and utilizing anterolateral approach, patient's left knee space was injected with 4:1  marcaine 0.5%: Kenalog 40mg /dL. Patient tolerated the procedure well without immediate complications.     Impression and Recommendations:     This case required medical decision making of moderate complexity. The above documentation has been reviewed and is  accurate and complete , DO       Note: This dictation was prepared with Dragon dictation along with smaller phrase technology. Any transcriptional errors that result from this process are unintentional.

## 2019-11-06 NOTE — Assessment & Plan Note (Signed)
New undiagnosed problem.  Radicular symptoms bilaterally in the C8 distribution right greater than left.  Discussed icing regimen and home exercises.  Patient work with Event organiser.  Increase activity slowly.  Follow-up with me again in 4 to 8 weeks.

## 2019-11-06 NOTE — Patient Instructions (Addendum)
It was great to see you!  1. You will be contacted about your abdominal ultrasound and referral to Ear Nose Throat doctor  Please go to the lab for blood work.   Our office will call you with your results unless you have chosen to receive results via MyChart.  If your blood work is normal we will follow-up each year for physicals and as scheduled for chronic medical problems.  If anything is abnormal we will treat accordingly and get you in for a follow-up.  Take care,  [facility] Maintenance, Female Adopting a healthy lifestyle and getting preventive care are important in promoting health and wellness. Ask your health care provider about:  The right schedule for you to have regular tests and exams.  Things you can do on your own to prevent diseases and keep yourself healthy. What should I know about diet, weight, and exercise? Eat a healthy diet   Eat a diet that includes plenty of vegetables, fruits, low-fat dairy products, and lean protein.  Do not eat a lot of foods that are high in solid fats, added sugars, or sodium. Maintain a healthy weight Body mass index (BMI) is used to identify weight problems. It estimates body fat based on height and weight. Your health care provider can help determine your BMI and help you achieve or maintain a healthy weight. Get regular exercise Get regular exercise. This is one of the most important things you can do for your health. Most adults should:  Exercise for at least 150 minutes each week. The exercise should increase your heart rate and make you sweat (moderate-intensity exercise).  Do strengthening exercises at least twice a week. This is in addition to the moderate-intensity exercise.  Spend less time sitting. Even light physical activity can be beneficial. Watch cholesterol and blood lipids Have your blood tested for lipids and cholesterol at 58 years of age, then have this test every 5 years. Have your cholesterol  levels checked more often if:  Your lipid or cholesterol levels are high.  You are older than 58 years of age.  You are at high risk for heart disease. What should I know about cancer screening? Depending on your health history and family history, you may need to have cancer screening at various ages. This may include screening for:  Breast cancer.  Cervical cancer.  Colorectal cancer.  Skin cancer.  Lung cancer. What should I know about heart disease, diabetes, and high blood pressure? Blood pressure and heart disease  High blood pressure causes heart disease and increases the risk of stroke. This is more likely to develop in people who have high blood pressure readings, are of African descent, or are overweight.  Have your blood pressure checked: ? Every 3-5 years if you are 64-64 years of age. ? Every year if you are 50 years old or older. Diabetes Have regular diabetes screenings. This checks your fasting blood sugar level. Have the screening done:  Once every three years after age 10 if you are at a normal weight and have a low risk for diabetes.  More often and at a younger age if you are overweight or have a high risk for diabetes. What should I know about preventing infection? Hepatitis B If you have a higher risk for hepatitis B, you should be screened for this virus. Talk with your health care provider to find out if you are at risk for hepatitis B infection. Hepatitis C Testing is recommended for:  Everyone born from [birth year] through 1964.  Anyone with known risk factors for hepatitis C. Sexually transmitted infections (STIs)  Get screened for STIs, including gonorrhea and chlamydia, if: ? You are sexually active and are younger than 58 years of age. ? You are older than 58 years of age and your health care provider tells you that you are at risk for this type of infection. ? Your sexual activity has changed since you were last screened, and you are at increased  risk for chlamydia or gonorrhea. Ask your health care provider if you are at risk.  Ask your health care provider about whether you are at high risk for HIV. Your health care provider may recommend a prescription medicine to help prevent HIV infection. If you choose to take medicine to prevent HIV, you should first get tested for HIV. You should then be tested every 3 months for as long as you are taking the medicine. Pregnancy  If you are about to stop having your period (premenopausal) and you may become pregnant, seek counseling before you get pregnant.  Take 400 to 800 micrograms (mcg) of folic acid every day if you become pregnant.  Ask for birth control (contraception) if you want to prevent pregnancy. Osteoporosis and menopause Osteoporosis is a disease in which the bones lose minerals and strength with aging. This can result in bone fractures. If you are 4 years old or older, or if you are at risk for osteoporosis and fractures, ask your health care provider if you should:  Be screened for bone loss.  Take a calcium or vitamin D supplement to lower your risk of fractures.  Be given hormone replacement therapy (HRT) to treat symptoms of menopause. Follow these instructions at home: Lifestyle  Do not use any products that contain nicotine or tobacco, such as cigarettes, e-cigarettes, and chewing tobacco. If you need help quitting, ask your health care provider.  Do not use street drugs.  Do not share needles.  Ask your health care provider for help if you need support or information about quitting drugs. Alcohol use  Do not drink alcohol if: ? Your health care provider tells you not to drink. ? You are pregnant, may be pregnant, or are planning to become pregnant.  If you drink alcohol: ? Limit how much you use to 0-1 drink a day. ? Limit intake if you are breastfeeding.  Be aware of how much alcohol is in your drink. In the [place] one drink equals one 12 oz bottle of beer  (355 mL), one 5 oz glass of wine (148 mL), or one 1 oz glass of hard liquor (44 mL). General instructions  Schedule regular health, dental, and eye exams.  Stay current with your vaccines.  Tell your health care provider if: ? You often feel depressed. ? You have ever been abused or do not feel safe at home. Summary  Adopting a healthy lifestyle and getting preventive care are important in promoting health and wellness.  Follow your health care provider's instructions about healthy diet, exercising, and getting tested or screened for diseases.  Follow your health care provider's instructions on monitoring your cholesterol and blood pressure. This information is not intended to replace advice given to you by your health care provider. Make sure you discuss any questions you have with your health care provider. Document Revised: 07/25/2018 Document Reviewed: 07/25/2018 Elsevier Patient Education  2020 [employer].

## 2019-11-26 ENCOUNTER — Ambulatory Visit: Payer: 59 | Attending: Family

## 2019-11-26 DIAGNOSIS — Z23 Encounter for immunization: Secondary | ICD-10-CM

## 2019-11-26 NOTE — Progress Notes (Signed)
   Covid-19 Vaccination Clinic  Name:  FAYTH TREFRY    MRN: [id number] DOB: September 14, 1960  11/26/2019  Ms. Asleson was observed post Covid-19 immunization for 15 minutes without incident. She was provided with Vaccine Information Sheet and instruction to access the V-Safe system.   Ms. Magee was instructed to call 911 with any severe reactions post vaccine: [name] Difficulty breathing  . Swelling of face and throat  . A fast heartbeat  . A bad rash all over body  . Dizziness and weakness   Immunizations Administered    Name Date Dose VIS Date Route   Moderna COVID-19 Vaccine 11/26/2019  4:24 PM 0.5 mL 07/16/2019 Intramuscular   Manufacturer: Moderna   Lot: [id number]   NDC: 60737-106-26

## 2019-12-03 ENCOUNTER — Ambulatory Visit: Payer: 59 | Admitting: Family Medicine

## 2019-12-03 ENCOUNTER — Other Ambulatory Visit: Payer: Self-pay | Admitting: Physician Assistant

## 2019-12-03 ENCOUNTER — Other Ambulatory Visit: Payer: Self-pay

## 2019-12-03 ENCOUNTER — Encounter: Payer: Self-pay | Admitting: Family Medicine

## 2019-12-03 VITALS — BP 124/86 | HR 93 | Ht 66.0 in | Wt 267.0 lb

## 2019-12-03 DIAGNOSIS — M170 Bilateral primary osteoarthritis of knee: Secondary | ICD-10-CM | POA: Diagnosis not present

## 2019-12-03 DIAGNOSIS — M5030 Other cervical disc degeneration, unspecified cervical region: Secondary | ICD-10-CM | POA: Diagnosis not present

## 2019-12-03 MED ORDER — KETOROLAC TROMETHAMINE 60 MG/2ML IM SOLN
60.0000 mg | Freq: Once | INTRAMUSCULAR | Status: AC
  Administered 2019-12-03: 60 mg via INTRAMUSCULAR

## 2019-12-03 MED ORDER — METHYLPREDNISOLONE ACETATE 80 MG/ML IJ SUSP
80.0000 mg | Freq: Once | INTRAMUSCULAR | Status: AC
  Administered 2019-12-03: 80 mg via INTRAMUSCULAR

## 2019-12-03 MED ORDER — PREDNISONE 50 MG PO TABS: ORAL_TABLET | ORAL | 0 refills | Status: DC

## 2019-12-03 NOTE — Assessment & Plan Note (Signed)
Worsening symptoms at this time.  Chronic condition.  Gabapentin does not seem to be helping at the moment.  Toradol and Depo-Medrol given today.  Discussed icing regimen.  Formal physical therapy ordered but I am hoping that will be beneficial.  Discussed which activities to do which wants to avoid.  Follow-up with me again in 4 to 8 weeks.  Worsening symptoms will need to consider the possibility of advanced imaging

## 2019-12-03 NOTE — Assessment & Plan Note (Signed)
We will get approval for gel injections secondary to worsening pain.

## 2019-12-03 NOTE — Patient Instructions (Signed)
Prednisone for 5 days PT [address] Will call you about gel See me in 5 weeks for the neck

## 2019-12-03 NOTE — Progress Notes (Signed)
[name] [address] [place] [address] Phone: [phone] Subjective:   Aimee [name], am serving as a scribe for Dr. Antoine Primas. This visit occurred during the SARS-CoV-2 public health emergency.  Safety protocols were in place, including screening questions prior to the visit, additional usage of staff PPE, and extensive cleaning of exam room while observing appropriate contact time as indicated for disinfecting solutions.  I'm seeing this patient by the request  of:  Aimee [name], [place]  CC: Bilateral shoulder pain follow-up  EPP:[id number]   11/06/2019 Chronic problem with exacerbation.  Worsening pain again.  Patient given steroid injections.  We will get approval for viscosupplementation with icing regimen and home exercise, which activities to do which wants to avoid.  Patient will follow up with me again in 4 to 8 weeks also discussed medication management including topical anti-inflammatories that were prescribed  New undiagnosed problem.  Radicular symptoms bilaterally in the C8 distribution right greater than left.  Discussed icing regimen and home exercises.  Patient work with Event organiser.  Increase activity slowly.  Follow-up with me again in 4 to 8 weeks.  Update 12/03/2019 Aimee [name] is a 59 y.o. female coming in with complaint of neck and bilateral knee pain. Patient states that she is using gabapentin but it is not helping to alleviate her pain in the shoulder. R>L. Pain is worsening despite doing the exercises.  Patient states it is unrelenting.  The symptoms radiate down her arms.  Symptoms affecting daily activities and even waking her up at night.  Feels like she may be losing some strength in her hand.  Knee pain has improved but is looking to get gel injections today.        Past Medical History:  Diagnosis Date  . Anxiety   . Arthritis   . Depression   . GERD (gastroesophageal reflux disease)   .  Hypertension    Past Surgical History:  Procedure Laterality Date  . ABDOMINAL HYSTERECTOMY  2004  . CESAREAN SECTION  2003  . KNEE ARTHROSCOPY Right 2013   Social History   Socioeconomic History  . Marital status: Married    Spouse name: Not on file  . Number of children: Not on file  . Years of education: Not on file  . Highest education level: Not on file  Occupational History  . Not on file  Tobacco Use  . Smoking status: Never Smoker  . Smokeless tobacco: Never Used  Substance and Sexual Activity  . Alcohol use: No  . Drug use: No  . Sexual activity: Yes    Birth control/protection: Surgical  Other Topics Concern  . Not on file  Social History Narrative   Goes by [employer] for [employer] -- working there for 24 years   Married, 2 kids (34 and 15) and 1 grandson   Social Determinants of Corporate investment banker Strain:   . Difficulty of Paying Living Expenses:   Food Insecurity:   . Worried About Programme researcher, broadcasting/film/video in the Last Year:   . Barista in the Last Year:   Transportation Needs:   . Freight forwarder (Medical):   [name] Lack of Transportation (Non-Medical):   Physical Activity:   . Days of Exercise per Week:   . Minutes of Exercise per Session:   Stress:   . Feeling of Stress :   Social Connections:   . Frequency of  Communication with Friends and Family:   . Frequency of Social Gatherings with Friends and Family:   . Attends Religious Services:   . Active Member of Clubs or Organizations:   . Attends Archivist Meetings:   [name] Marital Status:    No Known Allergies Family History  Problem Relation Age of Onset  . Colon cancer Mother 56  . Heart disease Father   . Hypertension Father   . Heart attack Father 43  . Stroke Father   . Diabetes Sister   . Breast cancer Sister 56  . Heart attack Sister     Current Outpatient Medications (Endocrine & Metabolic):  .  predniSONE (DELTASONE) 50 MG tablet, Take one  tablet daily for the next 5 days.  Current Outpatient Medications (Cardiovascular):  .  amLODipine (NORVASC) 10 MG tablet, TAKE 1 TABLET(10 MG) BY MOUTH DAILY  Current Outpatient Medications (Respiratory):  .  budesonide-formoterol (SYMBICORT) 80-4.5 MCG/ACT inhaler, Inhale 2 puffs into the lungs 2 (two) times daily. [name]  ipratropium (ATROVENT) 0.03 % nasal spray, Place 2 sprays into both nostrils every 12 (twelve) hours. .  montelukast (SINGULAIR) 10 MG tablet, Take 1 tablet (10 mg total) by mouth at bedtime. [name]  PROAIR HFA 108 (90 Base) MCG/ACT inhaler, INHALE 1 TO 2 PUFFS PO Q 4 TO 6 HOURS PRN FOR WHEEZING  Current Outpatient Medications (Analgesics):  .  ibuprofen (ADVIL) 200 MG tablet, Take 400 mg by mouth as needed.   Current Outpatient Medications (Other):  [name]  ALPRAZolam (XANAX) 0.5 MG tablet, Take 0.5 mg by mouth 4 (four) times daily as needed.  .  Cholecalciferol (VITAMIN D3) 1000 units CAPS, Take 1 capsule by mouth daily. .  Diclofenac Sodium (PENNSAID) 2 % SOLN, Place 1 application onto the skin 2 (two) times daily. [name]  FLUoxetine (PROZAC) 40 MG capsule, fluoxetine 40 mg capsule  TAKE 1 CAPSULE BY MOUTH EVERY DAY .  gabapentin (NEURONTIN) 100 MG capsule, Take 2 capsules (200 mg total) by mouth at bedtime. .  mirabegron ER (MYRBETRIQ) 50 MG TB24 tablet, Take 50 mg by mouth daily. .  Multiple Vitamin (MULTIVITAMIN) tablet, Take 1 tablet by mouth daily. [name]  PREVIDENT 5000 SENSITIVE 1.1-5 % PSTE, See admin instructions.   Reviewed prior external information including notes and imaging from  primary care provider As well as notes that were available from care everywhere and other healthcare systems.  Past medical history, social, surgical and family history all reviewed in electronic medical record.  No pertanent information unless stated regarding to the chief complaint.   Review of Systems:  No headache, visual changes, nausea, vomiting, diarrhea, constipation, dizziness,  abdominal pain, skin rash, fevers, chills, night sweats, weight loss, swollen lymph nodes, body aches, joint swelling, chest pain, shortness of breath, mood changes. POSITIVE muscle aches, body aches, joint swelling  Objective  Blood pressure 124/86, pulse 93, height 5\' 6"  (1.676 m), weight 267 lb (121.1 kg), SpO2 96 %.   General: No apparent distress alert and oriented x3 mood and affect normal, dressed appropriately.  HEENT: Pupils equal, extraocular movements intact  Respiratory: Patient's speak in full sentences and does not appear short of breath  Cardiovascular: No lower extremity edema, non tender, no erythema  Neuro: Cranial nerves II through XII are intact, neurovascularly intact in all extremities with 2+ DTRs and 2+ pulses.  Antalgic gait noted. Neck exam shows that patient does have significant loss of lordosis of the neck.  Positive Spurling's with radicular symptoms in the C6-C7  distribution on the right side.  Patient's grip strength though does appear to be intact.  Knee exam shows the patient does have significant arthritic changes.  Trace effusion noted.  Lacks last 10 degrees of flexion.  Instability with varus and valgus force.    Impression and Recommendations:     This case required medical decision making of moderate complexity. The above documentation has been reviewed and is accurate and complete Judi Saa, DO       Note: This dictation was prepared with Dragon dictation along with smaller phrase technology. Any transcriptional errors that result from this process are unintentional.

## 2019-12-19 ENCOUNTER — Ambulatory Visit (INDEPENDENT_AMBULATORY_CARE_PROVIDER_SITE_OTHER): Payer: 59 | Admitting: Otolaryngology

## 2019-12-23 ENCOUNTER — Other Ambulatory Visit: Payer: Self-pay

## 2019-12-23 ENCOUNTER — Encounter (INDEPENDENT_AMBULATORY_CARE_PROVIDER_SITE_OTHER): Payer: Self-pay | Admitting: Otolaryngology

## 2019-12-23 ENCOUNTER — Ambulatory Visit (INDEPENDENT_AMBULATORY_CARE_PROVIDER_SITE_OTHER): Payer: 59 | Admitting: Otolaryngology

## 2019-12-23 VITALS — Temp 97.9°F

## 2019-12-23 DIAGNOSIS — H6121 Impacted cerumen, right ear: Secondary | ICD-10-CM

## 2019-12-23 DIAGNOSIS — M26621 Arthralgia of right temporomandibular joint: Secondary | ICD-10-CM | POA: Diagnosis not present

## 2019-12-23 NOTE — Progress Notes (Signed)
HPI: Aimee [name] is a 59 y.o. female who presents for evaluation of right ear pain that she has had for 3 to 4 years.  She saw her dentist as she thought it was related to her TMJ joint.  He suggested that she see ENT.  It hurts in front of her ear when she opens and closes her mouth.  She has had some blockage of her right ear.  Past Medical History:  Diagnosis Date  . Anxiety   . Arthritis   . Depression   . GERD (gastroesophageal reflux disease)   . Hypertension    Past Surgical History:  Procedure Laterality Date  . ABDOMINAL HYSTERECTOMY  2004  . CESAREAN SECTION  2003  . KNEE ARTHROSCOPY Right 2013   Social History   Socioeconomic History  . Marital status: Married    Spouse name: Not on file  . Number of children: Not on file  . Years of education: Not on file  . Highest education level: Not on file  Occupational History  . Not on file  Tobacco Use  . Smoking status: Never Smoker  . Smokeless tobacco: Never Used  Substance and Sexual Activity  . Alcohol use: No  . Drug use: No  . Sexual activity: Yes    Birth control/protection: Surgical  Other Topics Concern  . Not on file  Social History Narrative   Goes by [name] for [employer] -- working there for 24 years   Married, 2 kids (51 and 19) and 1 grandson   Social Determinants of Radio broadcast assistant Strain:   . Difficulty of Paying Living Expenses:   Food Insecurity:   . Worried About Charity fundraiser in the Last Year:   . Arboriculturist in the Last Year:   Transportation Needs:   . Film/video editor (Medical):   [name] Lack of Transportation (Non-Medical):   Physical Activity:   . Days of Exercise per Week:   . Minutes of Exercise per Session:   Stress:   . Feeling of Stress :   Social Connections:   . Frequency of Communication with Friends and Family:   . Frequency of Social Gatherings with Friends and Family:   . Attends Religious Services:   . Active Member of Clubs  or Organizations:   . Attends Archivist Meetings:   [name] Marital Status:    Family History  Problem Relation Age of Onset  . Colon cancer Mother 49  . Heart disease Father   . Hypertension Father   . Heart attack Father 83  . Stroke Father   . Diabetes Sister   . Breast cancer Sister 59  . Heart attack Sister    No Known Allergies Prior to Admission medications   Medication Sig Start Date End Date Taking? Authorizing Provider  ALPRAZolam Duanne Moron) 0.5 MG tablet Take 0.5 mg by mouth 4 (four) times daily as needed.  03/22/17  Yes [provider]  amLODipine (NORVASC) 10 MG tablet TAKE 1 TABLET(10 MG) BY MOUTH DAILY 12/03/19  Yes Vivi Barrack, MD  budesonide-formoterol [facility]) 80-4.5 MCG/ACT inhaler Inhale 2 puffs into the lungs 2 (two) times daily. 10/24/18  Yes [name], PA  Cholecalciferol (VITAMIN D3) 1000 units CAPS Take 1 capsule by mouth daily.   Yes [provider]  Diclofenac Sodium (PENNSAID) 2 % SOLN Place 1 application onto the skin 2 (two) times daily. 11/20/17  Yes [name], DO  FLUoxetine (PROZAC) 40 MG capsule fluoxetine 40 mg capsule  TAKE 1 CAPSULE BY MOUTH EVERY DAY   Yes [provider]  gabapentin (NEURONTIN) 100 MG capsule Take 2 capsules (200 mg total) by mouth at bedtime. 11/06/19  Yes Judi Saa, DO  ibuprofen (ADVIL) 200 MG tablet Take 400 mg by mouth as needed.   Yes [provider]  ipratropium (ATROVENT) 0.03 % nasal spray Place 2 sprays into both nostrils every 12 (twelve) hours. 11/08/18  Yes [name], PA  mirabegron ER (MYRBETRIQ) 50 MG TB24 tablet Take 50 mg by mouth daily.   Yes [provider]  montelukast (SINGULAIR) 10 MG tablet Take 1 tablet (10 mg total) by mouth at bedtime. 11/08/18  Yes [name], PA  Multiple Vitamin (MULTIVITAMIN) tablet Take 1 tablet by mouth daily.   Yes [provider]  predniSONE (DELTASONE) 50 MG tablet Take one tablet daily for  the next 5 days. 12/03/19  Yes Judi Saa, DO  PREVIDENT 5000 SENSITIVE 1.1-5 % PSTE See admin instructions. 01/09/18  Yes [provider]  PROAIR HFA 108 (90 Base) MCG/ACT inhaler INHALE 1 TO 2 PUFFS PO Q 4 TO 6 HOURS PRN FOR WHEEZING 10/20/18  Yes [provider]     Positive ROS: Otherwise negative  All other systems have been reviewed and were otherwise negative with the exception of those mentioned in the HPI and as above.  Physical Exam: Constitutional: Alert, well-appearing, no acute distress Ears: External ears without lesions or tenderness.  She has a large amount of wax in the right ear canal completely occluding the right ear canal that was removed in the office today using suction forceps.  The right ear canal and right TM otherwise clear.  She has a small amount of wax in the left ear canal that was cleaned with a curette and the left TM was clear. Nasal: External nose without lesions. Septum midline with mild rhinitis.  No signs of infection.. Clear nasal passages Oral: Lips and gums without lesions. Tongue and palate mucosa without lesions. Posterior oropharynx clear.  On opening and closing her mouth her jaw moves sideways with obvious TMJ abnormality.  On palpation of the right TMJ area she has clicking and popping consistent with chronic TMJ dysfunction. Neck: No palpable adenopathy or masses  Respiratory: Breathing comfortably  Skin: No facial/neck lesions or rash noted.  Cerumen impaction removal  Date/Time: 12/23/2019 5:13 PM Performed by: Drema Halon, MD Authorized by: Drema Halon, MD   Consent:    Consent obtained:  Verbal   Consent given by:  Patient   Risks discussed:  Pain and bleeding Procedure details:    Location:  L ear and R ear   Procedure type: suction and forceps   Post-procedure details:    Inspection:  TM intact and canal normal   Hearing quality:  Improved   Patient tolerance of procedure:  Tolerated well,  no immediate complications Comments:     Both TMs are clear bilaterally.    Assessment: Ear pain secondary to right TMJ problems Right ear cerumen impaction.  Plan: Ear canals were cleaned in the office today. Concerning the chronic TMJ dysfunction recommended soft diet and NSAIDs.  Would also recommend follow-up with one of the dentist that treat TMJ problems.  Narda Bonds, MD

## 2020-01-09 ENCOUNTER — Ambulatory Visit (INDEPENDENT_AMBULATORY_CARE_PROVIDER_SITE_OTHER): Payer: 59 | Admitting: Family Medicine

## 2020-01-09 DIAGNOSIS — Z5329 Procedure and treatment not carried out because of patient's decision for other reasons: Secondary | ICD-10-CM

## 2020-01-09 NOTE — Progress Notes (Signed)
No show

## 2020-01-29 ENCOUNTER — Other Ambulatory Visit: Payer: Self-pay | Admitting: Physician Assistant

## 2020-02-18 ENCOUNTER — Telehealth: Payer: 59 | Admitting: Physician Assistant

## 2020-03-29 ENCOUNTER — Other Ambulatory Visit: Payer: Self-pay | Admitting: Physician Assistant

## 2020-04-23 ENCOUNTER — Encounter: Payer: Self-pay | Admitting: Family Medicine

## 2020-04-23 ENCOUNTER — Ambulatory Visit: Payer: 59 | Admitting: Family Medicine

## 2020-04-23 ENCOUNTER — Other Ambulatory Visit: Payer: Self-pay

## 2020-04-23 DIAGNOSIS — M170 Bilateral primary osteoarthritis of knee: Secondary | ICD-10-CM

## 2020-04-23 NOTE — Patient Instructions (Addendum)
Good to see you Gel injections today It will be a little sore the next few days  See me again in 2 months

## 2020-04-23 NOTE — Progress Notes (Signed)
[name] [address] [place] [address] Phone: [phone] Subjective:   I Aimee [name] am serving as a Neurosurgeon for Dr. Antoine Primas.  This visit occurred during the SARS-CoV-2 public health emergency.  Safety protocols were in place, including screening questions prior to the visit, additional usage of staff PPE, and extensive cleaning of exam room while observing appropriate contact time as indicated for disinfecting solutions.   I'm seeing this patient by the request  of:  [name], [place]  CC: Bilateral knee pain follow-up  [id number]   12/03/2019 Worsening symptoms at this time.  Chronic condition.  Gabapentin does not seem to be helping at the moment.  Toradol and Depo-Medrol given today.  Discussed icing regimen.  Formal physical therapy ordered but I am hoping that will be beneficial.  Discussed which activities to do which wants to avoid.  Follow-up with me again in 4 to 8 weeks.  Worsening symptoms will need to consider the possibility of advanced imaging  Update 04/23/2020 Aimee [name] is a 60 y.o. female coming in with complaint of bilateral knee pain. Steroid injections given on 11/06/2019. Durolane approved on 12/09/2019. Patient has responded well to viscosupplementation previously.      Past Medical History:  Diagnosis Date  . Anxiety   . Arthritis   . Depression   . GERD (gastroesophageal reflux disease)   . Hypertension    Past Surgical History:  Procedure Laterality Date  . ABDOMINAL HYSTERECTOMY  2004  . CESAREAN SECTION  2003  . KNEE ARTHROSCOPY Right 2013   Social History   Socioeconomic History  . Marital status: Married    Spouse name: Not on file  . Number of children: Not on file  . Years of education: Not on file  . Highest education level: Not on file  Occupational History  . Not on file  Tobacco Use  . Smoking status: Never Smoker  . Smokeless tobacco: Never Used  Vaping Use  . Vaping Use:  Never used  Substance and Sexual Activity  . Alcohol use: No  . Drug use: No  . Sexual activity: Yes    Birth control/protection: Surgical  Other Topics Concern  . Not on file  Social History Narrative   Goes by [employer] for [employer] -- working there for 24 years   Married, 2 kids (34 and 15) and 1 grandson   Social Determinants of Corporate investment banker Strain:   . Difficulty of Paying Living Expenses: Not on file  Food Insecurity:   . Worried About Programme researcher, broadcasting/film/video in the Last Year: Not on file  . Ran Out of Food in the Last Year: Not on file  Transportation Needs:   . Lack of Transportation (Medical): Not on file  . Lack of Transportation (Non-Medical): Not on file  Physical Activity:   . Days of Exercise per Week: Not on file  . Minutes of Exercise per Session: Not on file  Stress:   . Feeling of Stress : Not on file  Social Connections:   . Frequency of Communication with Friends and Family: Not on file  . Frequency of Social Gatherings with Friends and Family: Not on file  . Attends Religious Services: Not on file  . Active Member of Clubs or Organizations: Not on file  . Attends Banker Meetings: Not on file  . Marital Status: Not on file   No Known Allergies Family History  Problem Relation Age of Onset  . Colon cancer Mother 66  . Heart disease Father   . Hypertension Father   . Heart attack Father 39  . Stroke Father   . Diabetes Sister   . Breast cancer Sister 77  . Heart attack Sister     Current Outpatient Medications (Endocrine & Metabolic):  .  predniSONE (DELTASONE) 50 MG tablet, Take one tablet daily for the next 5 days.  Current Outpatient Medications (Cardiovascular):  .  amLODipine (NORVASC) 10 MG tablet, TAKE 1 TABLET(10 MG) BY MOUTH DAILY  Current Outpatient Medications (Respiratory):  .  budesonide-formoterol (SYMBICORT) 80-4.5 MCG/ACT inhaler, Inhale 2 puffs into the lungs 2 (two) times daily. [name]   ipratropium (ATROVENT) 0.03 % nasal spray, Place 2 sprays into both nostrils every 12 (twelve) hours. .  montelukast (SINGULAIR) 10 MG tablet, TAKE 1 TABLET(10 MG) BY MOUTH AT BEDTIME .  PROAIR HFA 108 (90 Base) MCG/ACT inhaler, INHALE 1 TO 2 PUFFS PO Q 4 TO 6 HOURS PRN FOR WHEEZING  Current Outpatient Medications (Analgesics):  .  ibuprofen (ADVIL) 200 MG tablet, Take 400 mg by mouth as needed.   Current Outpatient Medications (Other):  [name]  ALPRAZolam (XANAX) 0.5 MG tablet, Take 0.5 mg by mouth 4 (four) times daily as needed.  .  Cholecalciferol (VITAMIN D3) 1000 units CAPS, Take 1 capsule by mouth daily. .  Diclofenac Sodium (PENNSAID) 2 % SOLN, Place 1 application onto the skin 2 (two) times daily. [name]  FLUoxetine (PROZAC) 40 MG capsule, fluoxetine 40 mg capsule  TAKE 1 CAPSULE BY MOUTH EVERY DAY .  gabapentin (NEURONTIN) 100 MG capsule, Take 2 capsules (200 mg total) by mouth at bedtime. .  mirabegron ER (MYRBETRIQ) 50 MG TB24 tablet, Take 50 mg by mouth daily. .  Multiple Vitamin (MULTIVITAMIN) tablet, Take 1 tablet by mouth daily. [name]  PREVIDENT 5000 SENSITIVE 1.1-5 % PSTE, See admin instructions. .  Vitamin D, Ergocalciferol, (DRISDOL) 1.25 MG (50000 UNIT) CAPS capsule, TAKE 1 CAPSULE BY MOUTH EVERY WEEK   Reviewed prior external information including notes and imaging from  primary care provider As well as notes that were available from care everywhere and other healthcare systems.  Past medical history, social, surgical and family history all reviewed in electronic medical record.  No pertanent information unless stated regarding to the chief complaint.   Review of Systems:  No headache, visual changes, nausea, vomiting, diarrhea, constipation, dizziness, abdominal pain, skin rash, fevers, chills, night sweats, weight loss, swollen lymph nodes, body aches, joint swelling, chest pain, shortness of breath, mood changes. POSITIVE muscle aches  Objective  Blood pressure (!) 152/96,  pulse 79, height 5\' 6"  (1.676 m), weight 268 lb (121.6 kg), SpO2 97 %.   General: No apparent distress alert and oriented x3 mood and affect normal, dressed appropriately.  HEENT: Pupils equal, extraocular movements intact  Respiratory: Patient's speak in full sentences and does not appear short of breath  Cardiovascular: No lower extremity edema, non tender, no erythema  Neuro: Cranial nerves II through XII are intact, neurovascularly intact in all extremities with 2+ DTRs and 2+ pulses.  Gait normal with good balance and coordination.  MSK:  Knee: Bilateral valgus deformity noted.  Abnormal thigh to calf ratio.  Tender to palpation over medial and PF joint line.  ROM limited in the flexion and extension of 5 to 10 degrees. instability with valgus force.  painful patellar compression. Patellar glide with moderate crepitus. Patellar and quadriceps tendons unremarkable. Hamstring and quadriceps  strength is normal.  After informed written and verbal consent, patient was seated on exam table. Right knee was prepped with alcohol swab and utilizing anterolateral approach, patient's right knee space was injected with 60 mg per 3 mL of 1 Durolane(sodium hyaluronate) in a prefilled syringe was injected easily into the knee through a 22-gauge needle..Patient tolerated the procedure well without immediate complications.  After informed written and verbal consent, patient was seated on exam table. Left knee was prepped with alcohol swab and utilizing anterolateral approach, patient's left knee space was injected with 60 mg.  3 mL of Durolane (sodium hyaluronate) in a prefilled syringe was injected easily into the knee through a 22-gauge needle..Patient tolerated the procedure well without immediate complications.    Impression and Recommendations:     The above documentation has been reviewed and is accurate and complete Judi Saa, DO       Note: This dictation was prepared with Dragon  dictation along with smaller phrase technology. Any transcriptional errors that result from this process are unintentional.

## 2020-04-23 NOTE — Assessment & Plan Note (Signed)
Viscosupplementation given again today.  Patient has failed all other conservative therapy.  Secondary to patient's age we are attempting to avoid any type of surgical intervention if possible.  Discussed icing regimen, encourage weight loss, patient will continue the other medications including the ibuprofen, gabapentin follow-up again in  8 weeks

## 2020-06-23 ENCOUNTER — Ambulatory Visit: Payer: 59 | Admitting: Family Medicine

## 2020-07-04 ENCOUNTER — Other Ambulatory Visit: Payer: Self-pay | Admitting: Family Medicine

## 2020-07-23 ENCOUNTER — Ambulatory Visit: Payer: 59 | Admitting: Family Medicine

## 2020-08-12 ENCOUNTER — Encounter: Payer: Self-pay | Admitting: Family Medicine

## 2020-08-12 ENCOUNTER — Ambulatory Visit (INDEPENDENT_AMBULATORY_CARE_PROVIDER_SITE_OTHER): Payer: 59 | Admitting: Family Medicine

## 2020-08-12 ENCOUNTER — Other Ambulatory Visit: Payer: Self-pay

## 2020-08-12 VITALS — BP 138/96 | HR 87 | Ht 66.0 in | Wt 267.0 lb

## 2020-08-12 DIAGNOSIS — M170 Bilateral primary osteoarthritis of knee: Secondary | ICD-10-CM

## 2020-08-12 DIAGNOSIS — E669 Obesity, unspecified: Secondary | ICD-10-CM | POA: Diagnosis not present

## 2020-08-12 DIAGNOSIS — Z6841 Body Mass Index (BMI) 40.0 and over, adult: Secondary | ICD-10-CM | POA: Diagnosis not present

## 2020-08-12 NOTE — Patient Instructions (Signed)
See me in 3 months Injected knees today Weight management will call you

## 2020-08-12 NOTE — Assessment & Plan Note (Signed)
Patient for any surgical intervention would need to have a BMI less than 40.  Will refer patient to healthy weight and wellness

## 2020-08-12 NOTE — Assessment & Plan Note (Signed)
Bilateral knee pain.  Did not respond as well to the viscosupplementation.  Steroid injections given today.  Discussed with patient about the importance of weight loss and patient will be referred today.  We discussed proper shoes, icing regimen.  Patient may need surgical intervention in the future.  Patient does BMI is above 40 and is encouraged to lose weight so she does have options.  Follow-up with me again in 3 months for this chronic problem with exacerbation

## 2020-08-12 NOTE — Progress Notes (Signed)
[name] [address] [place] [address] Phone: [phone] Subjective:   Aimee [name], am serving as a scribe for Dr. Antoine Primas. This visit occurred during the SARS-CoV-2 public health emergency.  Safety protocols were in place, including screening questions prior to the visit, additional usage of staff PPE, and extensive cleaning of exam room while observing appropriate contact time as indicated for disinfecting solutions.   I'm seeing this patient by the request  of:  Aimee [name], [place]  CC: Bilateral knee pain follow-up  [id number]   04/23/2020 Viscosupplementation given again today.  Patient has failed all other conservative therapy.  Secondary to patient's age we are attempting to avoid any type of surgical intervention if possible.  Discussed icing regimen, encourage weight loss, patient will continue the other medications including the ibuprofen, gabapentin follow-up again in  8 weeks  Update 08/12/2020 Aimee [name] is a 59 y.o. female coming in with complaint of bilateral knee pain. Gel injections last visit. Patient states that she believes steroid worked better than the gel injection. Did not have any relief from gel injection. Has been having pain daily.  Patient states some increasing instability.  Pain more on the right lateral aspect of the knees.  Patient has had x-rays since 2019 showing severe tricompartmental osteoarthritic changes.      Past Medical History:  Diagnosis Date  . Anxiety   . Arthritis   . Depression   . GERD (gastroesophageal reflux disease)   . Hypertension    Past Surgical History:  Procedure Laterality Date  . ABDOMINAL HYSTERECTOMY  2004  . CESAREAN SECTION  2003  . KNEE ARTHROSCOPY Right 2013   Social History   Socioeconomic History  . Marital status: Married    Spouse name: Not on file  . Number of children: Not on file  . Years of education: Not on file  . Highest education  level: Not on file  Occupational History  . Not on file  Tobacco Use  . Smoking status: Never Smoker  . Smokeless tobacco: Never Used  Vaping Use  . Vaping Use: Never used  Substance and Sexual Activity  . Alcohol use: No  . Drug use: No  . Sexual activity: Yes    Birth control/protection: Surgical  Other Topics Concern  . Not on file  Social History Narrative   Goes by Aimee [name] for [employer] -- working there for 24 years   Married, 2 kids (34 and 15) and 1 grandson   Social Determinants of Corporate investment banker Strain: Not on [employer] [name] Insecurity: Not on file  Transportation Needs: Not on file  Physical Activity: Not on file  Stress: Not on file  Social Connections: Not on file   No Known Allergies Family History  Problem Relation Age of Onset  . Colon cancer Mother 40  . Heart disease Father   . Hypertension Father   . Heart attack Father 12  . Stroke Father   . Diabetes Sister   . Breast cancer Sister 50  . Heart attack Sister     Current Outpatient Medications (Endocrine & Metabolic):  .  predniSONE (DELTASONE) 50 MG tablet, Take one tablet daily for the next 5 days.  Current Outpatient Medications (Cardiovascular):  .  amLODipine (NORVASC) 10 MG tablet, TAKE 1 TABLET(10 MG) BY MOUTH DAILY  Current Outpatient Medications (Respiratory):  .  budesonide-formoterol (SYMBICORT) 80-4.5 MCG/ACT inhaler, Inhale 2 puffs into the  lungs 2 (two) times daily. Aimee [name]  ipratropium (ATROVENT) 0.03 % nasal spray, Place 2 sprays into both nostrils every 12 (twelve) hours. .  montelukast (SINGULAIR) 10 MG tablet, TAKE 1 TABLET(10 MG) BY MOUTH AT BEDTIME .  PROAIR HFA 108 (90 Base) MCG/ACT inhaler, INHALE 1 TO 2 PUFFS PO Q 4 TO 6 HOURS PRN FOR WHEEZING  Current Outpatient Medications (Analgesics):  .  ibuprofen (ADVIL) 200 MG tablet, Take 400 mg by mouth as needed.   Current Outpatient Medications (Other):  Aimee [name]  ALPRAZolam (XANAX) 0.5 MG tablet, Take 0.5 mg by  mouth 4 (four) times daily as needed.  .  Cholecalciferol (VITAMIN D3) 1000 units CAPS, Take 1 capsule by mouth daily. .  Diclofenac Sodium (PENNSAID) 2 % SOLN, Place 1 application onto the skin 2 (two) times daily. Aimee [name]  FLUoxetine (PROZAC) 40 MG capsule, fluoxetine 40 mg capsule  TAKE 1 CAPSULE BY MOUTH EVERY DAY .  gabapentin (NEURONTIN) 100 MG capsule, Take 2 capsules (200 mg total) by mouth at bedtime. .  mirabegron ER (MYRBETRIQ) 50 MG TB24 tablet, Take 50 mg by mouth daily. .  Multiple Vitamin (MULTIVITAMIN) tablet, Take 1 tablet by mouth daily. Aimee [name]  PREVIDENT 5000 SENSITIVE 1.1-5 % PSTE, See admin instructions. .  Vitamin D, Ergocalciferol, (DRISDOL) 1.25 MG (50000 UNIT) CAPS capsule, TAKE 1 CAPSULE BY MOUTH EVERY WEEK   Reviewed prior external information including notes and imaging from  primary care provider As well as notes that were available from care everywhere and other healthcare systems.  Past medical history, social, surgical and family history all reviewed in electronic medical record.  No pertanent information unless stated regarding to the chief complaint.   Review of Systems:  No headache, visual changes, nausea, vomiting, diarrhea, constipation, dizziness, abdominal pain, skin rash, fevers, chills, night sweats, weight loss, swollen lymph nodes, body aches, joint swelling, chest pain, shortness of breath, mood changes. POSITIVE muscle aches  Objective  Blood pressure (!) 138/96, pulse 87, height 5\' 6"  (1.676 m), weight 267 lb (121.1 kg), SpO2 95 %.   General: No apparent distress alert and oriented x3 mood and affect normal, dressed appropriately.  Overweight HEENT: Pupils equal, extraocular movements intact  Respiratory: Patient's speak in full sentences and does not appear short of breath  Cardiovascular: trace lower extremity edema, non tender, no erythema  Antalgic gait MSK: Knee: valgus deformity noted.  Abnormal thigh to calf ratio.  Tender to palpation over  medial and PF joint line.  ROM full in flexion and extension and lower leg rotation. instability with valgus force.  painful patellar compression. Patellar glide with moderate crepitus. Patellar and quadriceps tendons unremarkable. Hamstring and quadriceps strength is normal.  After informed written and verbal consent, patient was seated on exam table. Right knee was prepped with alcohol swab and utilizing anterolateral approach, patient's right knee space was injected with 4:1  marcaine 0.5%: Kenalog 40mg /dL. Patient tolerated the procedure well without immediate complications.  After informed written and verbal consent, patient was seated on exam table. Left knee was prepped with alcohol swab and utilizing anterolateral approach, patient's left knee space was injected with 4:1  marcaine 0.5%: Kenalog 40mg /dL. Patient tolerated the procedure well without immediate complications.    Impression and Recommendations:     The above documentation has been reviewed and is accurate and complete , DO

## 2020-08-13 ENCOUNTER — Encounter: Payer: Self-pay | Admitting: Family Medicine

## 2020-08-18 ENCOUNTER — Other Ambulatory Visit: Payer: Self-pay

## 2020-09-13 ENCOUNTER — Encounter (INDEPENDENT_AMBULATORY_CARE_PROVIDER_SITE_OTHER): Payer: Self-pay

## 2020-11-09 ENCOUNTER — Other Ambulatory Visit: Payer: Self-pay

## 2020-11-09 ENCOUNTER — Other Ambulatory Visit: Payer: Self-pay | Admitting: Physician Assistant

## 2020-11-09 ENCOUNTER — Encounter: Payer: Self-pay | Admitting: Physician Assistant

## 2020-11-09 ENCOUNTER — Ambulatory Visit (INDEPENDENT_AMBULATORY_CARE_PROVIDER_SITE_OTHER): Payer: 59 | Admitting: Physician Assistant

## 2020-11-09 VITALS — BP 136/80 | HR 75 | Temp 97.9°F | Ht 63.0 in | Wt 255.6 lb

## 2020-11-09 DIAGNOSIS — Z800 Family history of malignant neoplasm of digestive organs: Secondary | ICD-10-CM | POA: Diagnosis not present

## 2020-11-09 DIAGNOSIS — F321 Major depressive disorder, single episode, moderate: Secondary | ICD-10-CM

## 2020-11-09 DIAGNOSIS — I10 Essential (primary) hypertension: Secondary | ICD-10-CM | POA: Diagnosis not present

## 2020-11-09 DIAGNOSIS — E669 Obesity, unspecified: Secondary | ICD-10-CM | POA: Diagnosis not present

## 2020-11-09 DIAGNOSIS — R5383 Other fatigue: Secondary | ICD-10-CM | POA: Diagnosis not present

## 2020-11-09 DIAGNOSIS — E559 Vitamin D deficiency, unspecified: Secondary | ICD-10-CM

## 2020-11-09 DIAGNOSIS — Z0000 Encounter for general adult medical examination without abnormal findings: Secondary | ICD-10-CM

## 2020-11-09 DIAGNOSIS — R710 Precipitous drop in hematocrit: Secondary | ICD-10-CM

## 2020-11-09 DIAGNOSIS — F419 Anxiety disorder, unspecified: Secondary | ICD-10-CM

## 2020-11-09 LAB — CBC WITH DIFFERENTIAL/PLATELET
Basophils Absolute: 0 10*3/uL (ref 0.0–0.1)
Basophils Relative: 0.3 % (ref 0.0–3.0)
Eosinophils Absolute: 0.1 10*3/uL (ref 0.0–0.7)
Eosinophils Relative: 2 % (ref 0.0–5.0)
HCT: 36.9 % (ref 36.0–46.0)
Hemoglobin: 11.8 g/dL — ABNORMAL LOW (ref 12.0–15.0)
Lymphocytes Relative: 33 % (ref 12.0–46.0)
Lymphs Abs: 1.9 10*3/uL (ref 0.7–4.0)
MCHC: 31.9 g/dL (ref 30.0–36.0)
MCV: 78.4 fl (ref 78.0–100.0)
Monocytes Absolute: 0.5 10*3/uL (ref 0.1–1.0)
Monocytes Relative: 8.2 % (ref 3.0–12.0)
Neutro Abs: 3.2 10*3/uL (ref 1.4–7.7)
Neutrophils Relative %: 56.5 % (ref 43.0–77.0)
Platelets: 299 10*3/uL (ref 150.0–400.0)
RBC: 4.71 Mil/uL (ref 3.87–5.11)
RDW: 16 % — ABNORMAL HIGH (ref 11.5–15.5)
WBC: 5.7 10*3/uL (ref 4.0–10.5)

## 2020-11-09 LAB — COMPREHENSIVE METABOLIC PANEL
ALT: 16 U/L (ref 0–35)
AST: 15 U/L (ref 0–37)
Albumin: 4.4 g/dL (ref 3.5–5.2)
Alkaline Phosphatase: 67 U/L (ref 39–117)
BUN: 14 mg/dL (ref 6–23)
CO2: 28 mEq/L (ref 19–32)
Calcium: 9.2 mg/dL (ref 8.4–10.5)
Chloride: 103 mEq/L (ref 96–112)
Creatinine, Ser: 0.76 mg/dL (ref 0.40–1.20)
GFR: 86.38 mL/min (ref >60.00)
Glucose, Bld: 89 mg/dL (ref 70–99)
Potassium: 3.8 mEq/L (ref 3.5–5.1)
Sodium: 140 mEq/L (ref 135–145)
Total Bilirubin: 0.5 mg/dL (ref 0.2–1.2)
Total Protein: 7.6 g/dL (ref 6.0–8.3)

## 2020-11-09 LAB — HEMOGLOBIN A1C: Hgb A1c MFr Bld: 5.3 % (ref 4.6–6.5)

## 2020-11-09 LAB — LIPID PANEL
Cholesterol: 164 mg/dL (ref 0–200)
HDL: 47.9 mg/dL (ref >39.00)
LDL Cholesterol: 103 mg/dL — ABNORMAL HIGH (ref 0–99)
NonHDL: 116.28
Total CHOL/HDL Ratio: 3
Triglycerides: 66 mg/dL (ref 0.0–149.0)
VLDL: 13.2 mg/dL (ref 0.0–40.0)

## 2020-11-09 LAB — VITAMIN D 25 HYDROXY (VIT D DEFICIENCY, FRACTURES): VITD: 28.99 ng/mL — ABNORMAL LOW (ref 30.00–100.00)

## 2020-11-09 LAB — VITAMIN B12: Vitamin B-12: 326 pg/mL (ref 211–911)

## 2020-11-09 NOTE — Patient Instructions (Addendum)
It was great to see you!  Try benefiber or generic supplement -- can put in water or food.    You will be contacted about referral to genetics if you are eligible for appointment.  Please go to the lab for blood work.   Our office will call you with your results unless you have chosen to receive results via MyChart.  If your blood work is normal we will follow-up each year for physicals and as scheduled for chronic medical problems.  If anything is abnormal we will treat accordingly and get you in for a follow-up.  Take care,  [facility] Maintenance, Female Adopting a healthy lifestyle and getting preventive care are important in promoting health and wellness. Ask your health care provider about:  The right schedule for you to have regular tests and exams.  Things you can do on your own to prevent diseases and keep yourself healthy. What should I know about diet, weight, and exercise? Eat a healthy diet  Eat a diet that includes plenty of vegetables, fruits, low-fat dairy products, and lean protein.  Do not eat a lot of foods that are high in solid fats, added sugars, or sodium.   Maintain a healthy weight Body mass index (BMI) is used to identify weight problems. It estimates body fat based on height and weight. Your health care provider can help determine your BMI and help you achieve or maintain a healthy weight. Get regular exercise Get regular exercise. This is one of the most important things you can do for your health. Most adults should:  Exercise for at least 150 minutes each week. The exercise should increase your heart rate and make you sweat (moderate-intensity exercise).  Do strengthening exercises at least twice a week. This is in addition to the moderate-intensity exercise.  Spend less time sitting. Even light physical activity can be beneficial. Watch cholesterol and blood lipids Have your blood tested for lipids and cholesterol at 59 years of age,  then have this test every 5 years. Have your cholesterol levels checked more often if:  Your lipid or cholesterol levels are high.  You are older than 59 years of age.  You are at high risk for heart disease. What should I know about cancer screening? Depending on your health history and family history, you may need to have cancer screening at various ages. This may include screening for:  Breast cancer.  Cervical cancer.  Colorectal cancer.  Skin cancer.  Lung cancer. What should I know about heart disease, diabetes, and high blood pressure? Blood pressure and heart disease  High blood pressure causes heart disease and increases the risk of stroke. This is more likely to develop in people who have high blood pressure readings, are of African descent, or are overweight.  Have your blood pressure checked: ? Every 3-5 years if you are 11-50 years of age. ? Every year if you are 41 years old or older. Diabetes Have regular diabetes screenings. This checks your fasting blood sugar level. Have the screening done:  Once every three years after age 41 if you are at a normal weight and have a low risk for diabetes.  More often and at a younger age if you are overweight or have a high risk for diabetes. What should I know about preventing infection? Hepatitis B If you have a higher risk for hepatitis B, you should be screened for this virus. Talk with your health care provider to find out if  you are at risk for hepatitis B infection. Hepatitis C Testing is recommended for:  Everyone born from [birth year] through 1964.  Anyone with known risk factors for hepatitis C. Sexually transmitted infections (STIs)  Get screened for STIs, including gonorrhea and chlamydia, if: ? You are sexually active and are younger than 59 years of age. ? You are older than 59 years of age and your health care provider tells you that you are at risk for this type of infection. ? Your sexual activity has  changed since you were last screened, and you are at increased risk for chlamydia or gonorrhea. Ask your health care provider if you are at risk.  Ask your health care provider about whether you are at high risk for HIV. Your health care provider may recommend a prescription medicine to help prevent HIV infection. If you choose to take medicine to prevent HIV, you should first get tested for HIV. You should then be tested every 3 months for as long as you are taking the medicine. Pregnancy  If you are about to stop having your period (premenopausal) and you may become pregnant, seek counseling before you get pregnant.  Take 400 to 800 micrograms (mcg) of folic acid every day if you become pregnant.  Ask for birth control (contraception) if you want to prevent pregnancy. Osteoporosis and menopause Osteoporosis is a disease in which the bones lose minerals and strength with aging. This can result in bone fractures. If you are 59 years of age or older, or if you are at risk for osteoporosis and fractures, ask your health care provider if you should:  Be screened for bone loss.  Take a calcium or vitamin D supplement to lower your risk of fractures.  Be given hormone replacement therapy (HRT) to treat symptoms of menopause. Follow these instructions at home: Lifestyle  Do not use any products that contain nicotine or tobacco, such as cigarettes, e-cigarettes, and chewing tobacco. If you need help quitting, ask your health care provider.  Do not use street drugs.  Do not share needles.  Ask your health care provider for help if you need support or information about quitting drugs. Alcohol use  Do not drink alcohol if: ? Your health care provider tells you not to drink. ? You are pregnant, may be pregnant, or are planning to become pregnant.  If you drink alcohol: ? Limit how much you use to 0-1 drink a day. ? Limit intake if you are breastfeeding.  Be aware of how much alcohol is in  your drink. In the [place] one drink equals one 12 oz bottle of beer (355 mL), one 5 oz glass of wine (148 mL), or one 1 oz glass of hard liquor (44 mL). General instructions  Schedule regular health, dental, and eye exams.  Stay current with your vaccines.  Tell your health care provider if: ? You often feel depressed. ? You have ever been abused or do not feel safe at home. Summary  Adopting a healthy lifestyle and getting preventive care are important in promoting health and wellness.  Follow your health care provider's instructions about healthy diet, exercising, and getting tested or screened for diseases.  Follow your health care provider's instructions on monitoring your cholesterol and blood pressure. This information is not intended to replace advice given to you by your health care provider. Make sure you discuss any questions you have with your health care provider. Document Revised: 07/25/2018 Document Reviewed: 07/25/2018 Elsevier Patient Education  2021 Elsevier  Inc.  

## 2020-11-09 NOTE — Progress Notes (Signed)
Subjective:    Aimee [name] is a 60 y.o. female and is here for a comprehensive physical exam.   HPI  Health Maintenance Due  Topic Date Due  . COVID-19 Vaccine (3 - Booster for Moderna series) 05/27/2020    Acute Concerns: None  Chronic Issues: HTN  -- Currently taking Norvasc 10 mg. At home blood pressure readings are: 140-150/90. Patient denies chest pain, SOB, blurred vision, dizziness, unusual headaches, lower leg swelling. Patient is compliant with medication. Denies excessive caffeine intake, stimulant usage, excessive alcohol intake, or increase in salt consumption.  BP Readings from Last 3 Encounters:  11/09/20 136/80  08/12/20 (!) 138/96  04/23/20 (!) 152/96   Depression/anxiety -- taking prozac 40 mg daily; xanax prn. Managed by Dr. Evelene Croon.  Family hx of cancer -- has family hx of colon, breast and newly pancreatic cancer. Reports that her ob-gyn did some genetic screening but she is unsure what. She is interested in talking to the medical genetics team.  Vit D deficiency -- history of this. Taking MVI daily.  Obesity/Fatigue -- she has lost around 15 lb since last seeing me due to working out more. She has questions about which vitamins she can take. Denies rectal bleeding or issues with sleeping.  Health Maintenance: Immunizations -- UTD Colonoscopy -- UTD, due 8/25 Mammogram -- has had mammogram -- we are requesting records PAP -- N/A, Hysterectomy Bone Density -- N/A Diet -- trying to eat well; has stopped sodas/juices and sweets; more plant-based diet too Sleep habits -- doing well Exercise -- has joined [employer] Weight -- Weight: 255 lb 9.6 oz (115.9 kg)  Mood -- improved Weight history: Wt Readings from Last 10 Encounters:  11/09/20 255 lb 9.6 oz (115.9 kg)  08/12/20 267 lb (121.1 kg)  04/23/20 268 lb (121.6 kg)  12/03/19 267 lb (121.1 kg)  11/06/19 267 lb (121.1 kg)  11/06/19 267 lb 4 oz (121.2 kg)  07/02/19 265 lb (120.2 kg)  04/02/19  264 lb (119.7 kg)  03/19/19 267 lb (121.1 kg)  02/05/19 270 lb (122.5 kg)   Body mass index is 45.28 kg/m. No LMP recorded. Patient has had a hysterectomy. Alcohol use: none Tobacco use: none  UTD with dentist and eye doc  Depression screen [facility] 2/9 11/09/2020  Decreased Interest 0  Down, Depressed, Hopeless 0  PHQ - 2 Score 0  Altered sleeping -  Tired, decreased energy -  Change in appetite -  Feeling bad or failure about yourself  -  Trouble concentrating -  Moving slowly or fidgety/restless -  Suicidal thoughts -  PHQ-9 Score -  Difficult doing work/chores -     Other providers/specialists: Patient Care Team: [name], [place] as PCP - General (Physician Assistant)    PMHx, SurgHx, SocialHx, Medications, and Allergies were reviewed in the Visit Navigator and updated as appropriate.   Past Medical History:  Diagnosis Date  . Anxiety   . Arthritis   . Depression   . GERD (gastroesophageal reflux disease)   . Hypertension      Past Surgical History:  Procedure Laterality Date  . ABDOMINAL HYSTERECTOMY  2004  . CESAREAN SECTION  2003  . KNEE ARTHROSCOPY Right 2013     Family History  Problem Relation Age of Onset  . Colon cancer Mother 61  . Heart disease Father   . Hypertension Father   . Heart attack Father 61  . Stroke Father   . Diabetes Sister   . Breast  cancer Sister 56  . Heart attack Sister   . Pancreatic cancer Sister     Social History   Tobacco Use  . Smoking status: Never Smoker  . Smokeless tobacco: Never Used  Vaping Use  . Vaping Use: Never used  Substance Use Topics  . Alcohol use: No  . Drug use: No    Review of Systems:   Review of Systems  Constitutional: Negative for chills, fever, malaise/fatigue and weight loss.  HENT: Negative for hearing loss, sinus pain and sore throat.   Respiratory: Negative for cough and hemoptysis.   Cardiovascular: Negative for chest pain, palpitations, leg swelling and PND.   Gastrointestinal: Negative for abdominal pain, constipation, diarrhea, heartburn, nausea and vomiting.  Genitourinary: Negative for dysuria, frequency and urgency.  Musculoskeletal: Negative for back pain, myalgias and neck pain.  Skin: Negative for itching and rash.  Neurological: Negative for dizziness, tingling, seizures and headaches.  Endo/Heme/Allergies: Negative for polydipsia.  Psychiatric/Behavioral: Negative for depression. The patient is not nervous/anxious.     Objective:   BP 136/80 (BP Location: Left Arm, Patient Position: Sitting, Cuff Size: Large)   Pulse 75   Temp 97.9 F (36.6 C) (Temporal)   Ht 5\' 3"  (1.6 m)   Wt 255 lb 9.6 oz (115.9 kg)   SpO2 97%   BMI 45.28 kg/m  Body mass index is 45.28 kg/m.   General Appearance:    Alert, cooperative, no distress, appears stated age  Head:    Normocephalic, without obvious abnormality, atraumatic  Eyes:    PERRL, conjunctiva/corneas clear, EOM's intact, fundi    benign, both eyes  Ears:    Normal TM's and external ear canals, both ears  Nose:   Nares normal, septum midline, mucosa normal, no drainage    or sinus tenderness  Throat:   Lips, mucosa, and tongue normal; teeth and gums normal  Neck:   Supple, symmetrical, trachea midline, no adenopathy;    thyroid:  no enlargement/tenderness/nodules; no carotid   bruit or JVD  Back:     Symmetric, no curvature, ROM normal, no CVA tenderness  Lungs:     Clear to auscultation bilaterally, respirations unlabored  Chest Wall:    No tenderness or deformity   Heart:    Regular rate and rhythm, S1 and S2 normal, no murmur, rub or gallop  Breast Exam:    Deferred  Abdomen:     Soft, non-tender, bowel sounds active all four quadrants,    no masses, no organomegaly  Genitalia:    Deferred  Extremities:   Extremities normal, atraumatic, no cyanosis or edema  Pulses:   2+ and symmetric all extremities  Skin:   Skin color, texture, turgor normal, no rashes or lesions  Lymph  nodes:   Cervical, supraclavicular, and axillary nodes normal  Neurologic:   CNII-XII intact, normal strength, sensation and reflexes    throughout     Assessment/Plan:   Aimee [name] was seen today for annual exam and discuss medications.  Diagnoses and all orders for this visit:  Routine physical examination Today patient counseled on age appropriate routine health concerns for screening and prevention, each reviewed and up to date or declined. Immunizations reviewed and up to date or declined. Labs ordered and reviewed. Risk factors for depression reviewed and negative. Hearing function and visual acuity are intact. ADLs screened and addressed as needed. Functional ability and level of safety reviewed and appropriate. Education, counseling and referrals performed based on assessed risks today. Patient provided with a copy  of personalized plan for preventive services.  Family history of colon cancer Referral to genetics. -     Ambulatory referral to Genetics  Vitamin D deficiency Vit D rechecked today. -     VITAMIN D 25 Hydroxy (Vit-D Deficiency, Fractures)  Hypertension, unspecified type Normotensive with Norvasc 10 mg daily. Follow-up in 1 year, sooner if concerns. -     Lipid panel  Anxiety; Depression, major, single episode, moderate (HCC) Well controlled per patient. Management per psych. -     CBC with Differential/Platelet -     Comprehensive metabolic panel  Obesity Commended efforts on weight loss. -     Lipid panel -     Hemoglobin A1c  Fatigue, unspecified type Update B12 and D today. Continue MVI regularly. She would like to update vitamin levels only today, no further work-up. -     Vitamin B12    Well Adult Exam: Labs ordered: Yes. Patient counseling was done. See below for items discussed. Discussed the patient's BMI.  The BMI is not in the acceptable range; BMI management plan is completed Follow up in one year. Breast cancer screening: UTD. Cervical cancer  screening: UTD   Patient Counseling: [x]    Nutrition: Stressed importance of moderation in sodium/caffeine intake, saturated fat and cholesterol, caloric balance, sufficient intake of fresh fruits, vegetables, fiber, calcium, iron, and 1 mg of folate supplement per day (for females capable of pregnancy).  [x]    Stressed the importance of regular exercise.   [x]    Substance Abuse: Discussed cessation/primary prevention of tobacco, alcohol, or other drug use; driving or other dangerous activities under the influence; availability of treatment for abuse.   [x]    Injury prevention: Discussed safety belts, safety helmets, smoke detector, smoking near bedding or upholstery.   [x]    Sexuality: Discussed sexually transmitted diseases, partner selection, use of condoms, avoidance of unintended pregnancy  and contraceptive alternatives.  [x]    Dental health: Discussed importance of regular tooth brushing, flossing, and dental visits.  [x]    Health maintenance and immunizations reviewed. Please refer to Health maintenance section.   CMA or LPN served as scribe during this visit. History, Physical, and Plan performed by medical provider. The above documentation has been reviewed and is accurate and complete.   , PA-C [facility]

## 2020-11-10 ENCOUNTER — Ambulatory Visit: Payer: 59 | Admitting: Family Medicine

## 2020-11-10 ENCOUNTER — Telehealth: Payer: Self-pay | Admitting: Genetic Counselor

## 2020-11-10 NOTE — Telephone Encounter (Signed)
Received a genetic counseling referral from [name], [place] for fhx of colon cancer. Ms. Portee returned my call and has been scheduled to see emily on 4/7 aat 3pm. Pt aware to arrive 15 minutes early.

## 2020-11-13 ENCOUNTER — Other Ambulatory Visit: Payer: Self-pay | Admitting: Family Medicine

## 2020-11-15 NOTE — Progress Notes (Signed)
[name] [address] [place] [address] Phone: [phone] Subjective:   Aimee [name], am serving as a scribe for Dr. Antoine Primas. This visit occurred during the SARS-CoV-2 public health emergency.  Safety protocols were in place, including screening questions prior to the visit, additional usage of staff PPE, and extensive cleaning of exam room while observing appropriate contact time as indicated for disinfecting solutions.   I'm seeing this patient by the request  of:  [name], [place]  CC: knee pain follow up   OFB:[id number]   08/12/2020 Bilateral knee pain.  Did not respond as well to the viscosupplementation.  Steroid injections given today.  Discussed with patient about the importance of weight loss and patient will be referred today.  We discussed proper shoes, icing regimen.  Patient may need surgical intervention in the future.  Patient does BMI is above 40 and is encouraged to lose weight so she does have options.  Follow-up with me again in 3 months for this chronic problem with exacerbationBilateral knee pain.  Did not respond as well to the viscosupplementation.  Steroid injections given today.  Discussed with patient about the importance of weight loss and patient will be referred today.  We discussed proper shoes, icing regimen.  Patient may need surgical intervention in the future.  Patient does BMI is above 40 and is encouraged to lose weight so she does have options.  Follow-up with me again in 3 months for this chronic problem with exacerbation  Update 11/16/2020 Aimee [name] is a 60 y.o. female coming in with complaint of B knee pain. Pain began to return in March. Would like injections again today. R>L. Patient has been working out at [employer] and is feeling much better.     xrays of 2019 showed advance arthritis   Past Medical History:  Diagnosis Date  . Anxiety   . Arthritis   . Depression   . GERD  (gastroesophageal reflux disease)   . Hypertension    Past Surgical History:  Procedure Laterality Date  . ABDOMINAL HYSTERECTOMY  2004  . CESAREAN SECTION  2003  . KNEE ARTHROSCOPY Right 2013   Social History   Socioeconomic History  . Marital status: Married    Spouse name: Not on file  . Number of children: Not on file  . Years of education: Not on file  . Highest education level: Not on file  Occupational History  . Not on file  Tobacco Use  . Smoking status: Never Smoker  . Smokeless tobacco: Never Used  Vaping Use  . Vaping Use: Never used  Substance and Sexual Activity  . Alcohol use: No  . Drug use: No  . Sexual activity: Yes    Birth control/protection: Surgical  Other Topics Concern  . Not on file  Social History Narrative   Goes by [employer] for [employer] -- working there for 24 years   Married, 2 kids (34 and 15) and 1 grandson   Social Determinants of Corporate investment banker Strain: Not on [employer] Insecurity: Not on file  Transportation Needs: Not on file  Physical Activity: Not on file  Stress: Not on file  Social Connections: Not on file   No Known Allergies Family History  Problem Relation Age of Onset  . Colon cancer Mother 74  . Heart disease Father   . Hypertension Father   . Heart attack Father 20  . Stroke  Father   . Diabetes Sister   . Breast cancer Sister 5  . Heart attack Sister   . Pancreatic cancer Sister     Current Outpatient Medications (Endocrine & Metabolic):  .  predniSONE (DELTASONE) 50 MG tablet, Take one tablet daily for the next 5 days.  Current Outpatient Medications (Cardiovascular):  .  amLODipine (NORVASC) 10 MG tablet, TAKE 1 TABLET(10 MG) BY MOUTH DAILY  Current Outpatient Medications (Respiratory):  .  budesonide-formoterol (SYMBICORT) 80-4.5 MCG/ACT inhaler, Inhale 2 puffs into the lungs 2 (two) times daily. [name]  ipratropium (ATROVENT) 0.03 % nasal spray, Place 2 sprays into both nostrils  every 12 (twelve) hours. .  montelukast (SINGULAIR) 10 MG tablet, TAKE 1 TABLET(10 MG) BY MOUTH AT BEDTIME .  PROAIR HFA 108 (90 Base) MCG/ACT inhaler,   Current Outpatient Medications (Analgesics):  .  ibuprofen (ADVIL) 200 MG tablet, Take 400 mg by mouth as needed.   Current Outpatient Medications (Other):  [name]  ALPRAZolam (XANAX) 0.5 MG tablet, Take 0.5 mg by mouth 4 (four) times daily as needed.  .  Cholecalciferol (VITAMIN D3) 1000 units CAPS, Take 1 capsule by mouth daily. .  Diclofenac Sodium (PENNSAID) 2 % SOLN, Place 1 application onto the skin 2 (two) times daily. [name]  FLUoxetine (PROZAC) 40 MG capsule, fluoxetine 40 mg capsule  TAKE 1 CAPSULE BY MOUTH EVERY DAY .  gabapentin (NEURONTIN) 100 MG capsule, Take 2 capsules (200 mg total) by mouth at bedtime. .  mirabegron ER (MYRBETRIQ) 50 MG TB24 tablet, Take 50 mg by mouth daily. .  Multiple Vitamin (MULTIVITAMIN) tablet, Take 1 tablet by mouth daily. [name]  nystatin cream (MYCOSTATIN), nystatin 100,000 unit/gram topical cream  APPLY EXTERNALLY TO THE AFFECTED AREA TWICE DAILY IN THE MORNING AND EVENING .  PANTOPRAZOLE SODIUM PO, Take by mouth daily as needed. [name]  PREVIDENT 5000 SENSITIVE 1.1-5 % PSTE, See admin instructions. .  triamcinolone (KENALOG) 0.1 %, SMARTSIG:Sparingly Topical Twice Daily .  Vitamin D, Ergocalciferol, (DRISDOL) 1.25 MG (50000 UNIT) CAPS capsule, TAKE 1 CAPSULE BY MOUTH EVERY WEEK   Reviewed prior external information including notes and imaging from  primary care provider As well as notes that were available from care everywhere and other healthcare systems.  Past medical history, social, surgical and family history all reviewed in electronic medical record.  No pertanent information unless stated regarding to the chief complaint.   Review of Systems:  No headache, visual changes, nausea, vomiting, diarrhea, constipation, dizziness, abdominal pain, skin rash, fevers, chills, night sweats, weight loss, swollen  lymph nodes, joint swelling, chest pain, shortness of breath, mood changes. POSITIVE muscle aches, body aches  Objective  Blood pressure 120/82, pulse 85, height 5\' 3"  (1.6 m), weight 256 lb (116.1 kg), SpO2 98 %.   General: No apparent distress alert and oriented x3 mood and affect normal, dressed appropriately.  HEENT: Pupils equal, extraocular movements intact  Respiratory: Patient's speak in full sentences and does not appear short of breath  Cardiovascular: No lower extremity edema, non tender, no erythema  Gait n antalgic MSK: Knee: Bilateral valgus deformity noted. Large thigh to calf ratio.  Tender to palpation over medial and PF joint line.  ROM full in flexion and extension and lower leg rotation. instability with valgus force.  painful patellar compression. Patellar glide with moderate crepitus. Patellar and quadriceps tendons unremarkable. Hamstring and quadriceps strength is normal.  After informed written and verbal consent, patient was seated on exam table. Right knee was prepped with alcohol swab  and utilizing anterolateral approach, patient's right knee space was injected with 4:1  marcaine 0.5%: Kenalog 40mg /dL. Patient tolerated the procedure well without immediate complications.  After informed written and verbal consent, patient was seated on exam table. Left knee was prepped with alcohol swab and utilizing anterolateral approach, patient's left knee space was injected with 4:1  marcaine 0.5%: Kenalog 40mg /dL. Patient tolerated the procedure well without immediate complications.    Impression and Recommendations:     The above documentation has been reviewed and is accurate and complete , DO

## 2020-11-16 ENCOUNTER — Other Ambulatory Visit: Payer: Self-pay

## 2020-11-16 ENCOUNTER — Encounter: Payer: Self-pay | Admitting: Family Medicine

## 2020-11-16 ENCOUNTER — Ambulatory Visit: Payer: 59 | Admitting: Family Medicine

## 2020-11-16 DIAGNOSIS — M170 Bilateral primary osteoarthritis of knee: Secondary | ICD-10-CM

## 2020-11-16 NOTE — Patient Instructions (Addendum)
  Injected knees today See me again in 3 months

## 2020-11-19 ENCOUNTER — Inpatient Hospital Stay: Payer: 59 | Attending: Family Medicine | Admitting: Genetic Counselor

## 2020-11-19 ENCOUNTER — Other Ambulatory Visit: Payer: Self-pay

## 2020-11-19 ENCOUNTER — Inpatient Hospital Stay: Payer: 59

## 2020-11-19 DIAGNOSIS — Z803 Family history of malignant neoplasm of breast: Secondary | ICD-10-CM | POA: Diagnosis not present

## 2020-11-19 DIAGNOSIS — Z800 Family history of malignant neoplasm of digestive organs: Secondary | ICD-10-CM

## 2020-11-19 LAB — GENETIC SCREENING ORDER

## 2020-11-20 ENCOUNTER — Encounter: Payer: Self-pay | Admitting: Genetic Counselor

## 2020-11-20 DIAGNOSIS — Z803 Family history of malignant neoplasm of breast: Secondary | ICD-10-CM | POA: Insufficient documentation

## 2020-11-20 DIAGNOSIS — Z800 Family history of malignant neoplasm of digestive organs: Secondary | ICD-10-CM | POA: Insufficient documentation

## 2020-11-20 NOTE — Progress Notes (Signed)
REFERRING PROVIDER: [name], [place],   [id number]  PRIMARY PROVIDER:  [name], [place]  PRIMARY REASON FOR VISIT:  1. Family history of pancreatic cancer   2. Family history of breast cancer   3. Family history of colon cancer      HISTORY OF PRESENT ILLNESS:   Aimee [name], a 60 y.o. female, was seen for a [facility] cancer genetics consultation at the request of Dr. Morene Rankins due to a family history of cancer.  Aimee [name] presents to clinic today to discuss the possibility of a hereditary predisposition to cancer, genetic testing, and to further clarify her future cancer risks, as well as potential cancer risks for family members.   Aimee [name] does not have a personal history of cancer.    RISK FACTORS:  Menarche was at age 7.  First live birth at age 5.  OCP use for approximately less than 5 years.  Ovaries intact: yes.  Hysterectomy: yes.  Menopausal status: perimenopausal.  HRT use: 0 years. Colonoscopy: yes; 2020, normal. Mammogram within the last year: no. Number of breast biopsies: 0. Any excessive radiation exposure in the past: no.  Past Medical History:  Diagnosis Date  . Anxiety   . Arthritis   . Depression   . Family history of breast cancer   . Family history of colon cancer   . Family history of pancreatic cancer   . GERD (gastroesophageal reflux disease)   . Hypertension     Past Surgical History:  Procedure Laterality Date  . ABDOMINAL HYSTERECTOMY  2004  . CESAREAN SECTION  2003  . KNEE ARTHROSCOPY Right 2013    Social History   Socioeconomic History  . Marital status: Married    Spouse name: Not on file  . Number of children: Not on file  . Years of education: Not on file  . Highest education level: Not on file  Occupational History  . Not on file  Tobacco Use  . Smoking status: Never Smoker  . Smokeless tobacco: Never Used  Vaping Use  . Vaping Use: Never used  Substance and Sexual Activity  .  Alcohol use: No  . Drug use: No  . Sexual activity: Yes    Birth control/protection: Surgical  Other Topics Concern  . Not on file  Social History Narrative   Goes by [name] -- working there for 24 years   Married, 2 kids (38 and 14) and 1 grandson   Social Determinants of Radio broadcast assistant Strain: Not on [employer] Insecurity: Not on file  Transportation Needs: Not on file  Physical Activity: Not on file  Stress: Not on file  Social Connections: Not on file     FAMILY HISTORY:  We obtained a detailed, 4-generation family history.  Significant diagnoses are listed below: Family History  Problem Relation Age of Onset  . Colon cancer Mother 24  . Heart disease Father   . Hypertension Father   . Heart attack Father 20  . Stroke Father   . Diabetes Sister   . Breast cancer Sister 64  . Colon cancer Maternal Grandfather        dx >50  . Heart attack Sister   . Pancreatic cancer Sister 75  . Heart Problems Paternal Uncle    Aimee [name] has two sons (ages 43 and 49). She has four brothers (age 70-69) and three sisters (ages 65-64). One sister was diagnosed  with breast cancer at age 51. Another sister was diagnosed with pancreatic cancer at age 45.   Aimee [name] mother died at age 34 from colon cancer (diagnosed at age 13). There were three maternal aunts and one maternal uncle. There is no known cancer among maternal aunts/uncles or maternal cousins. Aimee [name] maternal grandmother died older than 5 without cancer. Her maternal grandfather died older than 16 from colon cancer (diagnosed older than 85).  Aimee [name] father died at age 62 without cancer. There were two paternal aunts and nine paternal uncles. There is no known cancer among paternal aunts/uncles or paternal cousins. Aimee [name] paternal grandmother died at age 32 without cancer. She does not have information about her paternal grandfather.   Aimee [name] is unaware of previous  family history of genetic testing for hereditary cancer risks. Patient's maternal and paternal ancestors are of 43 [place] descent. There is no reported Ashkenazi Jewish ancestry. There is no known consanguinity.  GENETIC COUNSELING ASSESSMENT: Aimee [name] is a 60 y.o. female with a family history of pancreatic cancer, breast cancer, and colon cancer which is somewhat suggestive of a hereditary cancer syndrome and predisposition to cancer. We, therefore, discussed and recommended the following at today's visit.   DISCUSSION: We discussed that approximately 5-10% of cancer is hereditary, with most cases of hereditary breast and or pancreatic cancer associated with the BRCA1 and BRCA2 genes. There are other genes that can be associated with hereditary breast, pancreatic, and/or colon cancer syndromes. These include ATM, CHEK2, PALB2, Lynch syndrome, etc. We discussed that testing is beneficial for several reasons, including knowing about other cancer risks, identifying potential screening and risk-reduction options that may be appropriate, and to understand if other family members could be at risk for cancer and allow them to undergo genetic testing.   We reviewed the characteristics, features and inheritance patterns of hereditary cancer syndromes. We also discussed genetic testing, including the appropriate family members to test, the process of testing, insurance coverage and turn-around-time for results. We discussed the implications of a negative, positive and/or variant of uncertain significant result.   Aimee [name] notes that she believes she had genetic testing performed previously through her OBGYN, Dr. Matthew Saras at [facility]. She does not remember what was included on her genetic test and a copy of the report was not available at today's visit. We will attempt to obtain a copy of this result and will plan to discuss with Aimee [name] whether additional testing may be needed at that  time.  If her previous genetic testing was limited, we would recommend Aimee [name] pursue additional genetic testing for a hereditary cancer gene panel inclusive of known breast, pancreatic, and colon cancer genes, such as the [facility] Common Hereditary Cancers + RNA panel. The Common Hereditary Cancers Panel offered by [facility] includes sequencing and/or deletion duplication testing of the following 47genes: APC*, ATM*, AXIN2*, BARD1*, BMPR1A*, BRCA1*, BRCA2*, BRIP1*, CDH1*, CDK4, CDKN2A (p14ARF), CDKN2A (p16INK4a), CHEK2*, CTNNA1*, DICER1*, EPCAM (Deletion/duplication testing only), GREM1 (promoter region deletion/duplication testing only), KIT, MEN1*, MLH1*, MSH2*, MSH3*, MSH6*, MUTYH*, NBN*, NF1*, NTHL1, PALB2*, PDGFRA, PMS2*, POLD1*, POLE*, PTEN*, RAD50*, RAD51C*, RAD51D*, SDHB*, SDHC*, SDHD*, SMAD4*, SMARCA4*, STK11*, TP53*, TSC1*, TSC2*, and VHL*.  The following genes were evaluated for sequence changes only: SDHA* and HOXB13 c.251G>A variant only. RNA analysis performed for * genes.    Based on Aimee [name]'s family history of cancer, she meets medical criteria for genetic testing. Despite that she meets criteria, there may still be an  out of pocket cost.   We also discussed that some people do not want to undergo genetic testing due to fear of genetic discrimination.  A federal law called the Genetic Information Non-Discrimination Act (GINA) of 2008 helps protect individuals against genetic discrimination based on their genetic test results.  It impacts both health insurance and employment.  With health insurance, it protects against increased premiums, being kicked off insurance or being forced to take a test in order to be insured.  For employment it protects against hiring, firing and promoting decisions based on genetic test results.  Health status due to a cancer diagnosis is not protected under GINA.  Additionally, life, disability, and long-term care insurance is not protected under GINA.   PLAN:   We will obtain a copy of Aimee [name]'s previous genetic testing. Once we are able to review these results, we will call Aimee [name] and discuss whether additional testing is needed. In the meantime, we recommend Aimee [name] continue to follow the cancer screening guidelines given by her primary healthcare provider.  Aimee [name] questions were answered to her satisfaction today. Our contact information was provided should additional questions or concerns arise. Thank you for the referral and allowing [place] to share in the care of your patient.   Clint Guy, [place], [facility] Licensed, Certified Dispensing [email or web address] Phone: [phone]  The patient was seen for a total of 40 minutes in face-to-face genetic counseling.  This patient was discussed with Drs. Magrinat, Lindi Adie and/or Burr Medico who agrees with the above.    _______________________________________________________________________ For Office Staff:  Number of people involved in session: 1 Was an Intern/ student involved with case: no

## 2020-11-23 ENCOUNTER — Encounter: Payer: Self-pay | Admitting: Family Medicine

## 2020-11-25 ENCOUNTER — Encounter: Payer: Self-pay | Admitting: Genetic Counselor

## 2020-11-25 ENCOUNTER — Telehealth: Payer: Self-pay | Admitting: Genetic Counselor

## 2020-11-25 ENCOUNTER — Ambulatory Visit: Payer: Self-pay | Admitting: Genetic Counselor

## 2020-11-25 DIAGNOSIS — Z1379 Encounter for other screening for genetic and chromosomal anomalies: Secondary | ICD-10-CM

## 2020-11-25 NOTE — Telephone Encounter (Signed)
Reviewed Aimee [name]'s previous genetic testing. Her genetic test reported out on 06/17/2019 through the [facility] gene panel. No pathogenic variants were detected. We discussed that her testing was fairly comprehensive for known breast, colon, and pancreatic cancer genes. We reviewed that it is possible that there could be a genetic mutation in the family that Aimee [name] did not inherit. There could also be a mutation in a different gene that we are not testing, or our current technology may not be able to detect certain mutations. It will therefore be important for her to stay in contact with genetics to keep up with whether additional testing may be appropriate in the future.

## 2020-11-25 NOTE — Progress Notes (Signed)
HPI:  [name] was previously seen in the [facility] clinic due to a family history of cancer and concerns regarding a hereditary predisposition to cancer. Please refer to our prior cancer genetics clinic note for more information regarding our discussion, assessment and recommendations, at the time. [name] previous genetic test results were obtained and discussed with her, as were recommendations warranted by these results. These results and recommendations are discussed in more detail below.  FAMILY HISTORY:  We obtained a detailed, 4-generation family history.  Significant diagnoses are listed below: Family History  Problem Relation Age of Onset  . Colon cancer Mother 72  . Heart disease Father   . Hypertension Father   . Heart attack Father 77  . Stroke Father   . Diabetes Sister   . Breast cancer Sister 58  . Colon cancer Maternal Grandfather        dx >50  . Heart attack Sister   . Pancreatic cancer Sister 59  . Heart Problems Paternal Uncle     [name] has two sons (ages 63 and 59). She has four brothers (age 72-69) and three sisters (ages 16-64). One sister was diagnosed with breast cancer at age 65. Another sister was diagnosed with pancreatic cancer at age 54.   [name] mother died at age 43 from colon cancer (diagnosed at age 22). There were three maternal aunts and one maternal uncle. There is no known cancer among maternal aunts/uncles or maternal cousins. [name] maternal grandmother died older than 57 without cancer. Her maternal grandfather died older than 18 from colon cancer (diagnosed older than 77).  [name] father died at age 26 without cancer. There were two paternal aunts and nine paternal uncles. There is no known cancer among paternal aunts/uncles or paternal cousins. [name] paternal grandmother died at age 80 without cancer. She does not have information about her paternal grandfather.   [name] is unaware of previous  family history of genetic testing for hereditary cancer risks. Patient's maternal and paternal ancestors are of 78 [place] descent. There is no reported Ashkenazi Jewish ancestry. There is no known consanguinity.  GENETIC TEST RESULTS: Genetic testing was ordered by Dr. Matthew Saras at [facility] and reported out on 06/17/2019 through the [facility] panel. No pathogenic variants were detected.   The [facility] gene panel offered by [facility] includes sequencing and deletion/duplication testing of the following 35 genes: APC, ATM, AXIN2, BARD1, BMPR1A, BRCA1, BRCA2, BRIP1, CDH1, CDK4, CDKN2A, CHEK2, EPCAM (large rearrangement only), HOXB13, (sequencing only), GALNT12, MLH1, MSH2, MSH3 (excluding repetitive portions of exon 1), MSH6, MUTYH, NBN, NTHL1, PALB2, PMS2, PTEN, RAD51C, RAD51D, RNF43, RPS20, SMAD4, STK11, and TP53. Sequencing was performed for select regions of POLE and POLD1, and large rearrangement analysis was performed for select regions of GREM1. The test report will be scanned into EPIC and located under the Molecular Pathology section of the Results Review tab.  A portion of the result report is included below for reference.     We discussed with Aimee [name] that because current genetic testing is not perfect, it is possible there may be a gene mutation in one of these genes that current testing cannot detect, but that chance is small.  We also discussed that there could be another gene that has not yet been discovered, or that we have not yet tested, that is responsible for the cancer diagnoses in the family. It is also possible there is a hereditary cause  for the cancer in the family that [name] did not inherit and therefore was not identified in her testing.  Therefore, it is important to remain in touch with cancer genetics in the future so that we can continue to offer [name] the most up to date genetic testing.   ADDITIONAL GENETIC TESTING:  We  discussed with [name] that her previous genetic testing was fairly extensive.  If there are genes identified to increase cancer risk that can be analyzed in the future, we would be happy to discuss and coordinate this testing at that time.  CANCER SCREENING RECOMMENDATIONS: [name] test result is considered negative (normal).  This means that we have not identified a hereditary cause for her family history of cancer at this time. While reassuring, this does not definitively rule out a hereditary predisposition to cancer. It is still possible that there could be genetic mutations that are undetectable by current technology. There could be genetic mutations in genes that have not been tested or identified to increase cancer risk.  Therefore, it is recommended she continue to follow the cancer management and screening guidelines provided by her primary healthcare provider.   An individual's cancer risk and medical management are not determined by genetic test results alone. Overall cancer risk assessment incorporates additional factors, including personal medical history, family history, and any available genetic information that may result in a personalized plan for cancer prevention and surveillance.  Based on [name]'s family history of a first degree relative with colon cancer at age 43, [name] should have more frequent colonoscopies at least every 5 years, or as determined by her GI doctors.  Based on [name]'s personal and family history, as well as her genetic test results, a statistical model Midwife) was used to estimate her risk of developing breast cancer. Tyrer-Cuzick estimates her lifetime risk of developing breast cancer to be approximately 17%.  This lifetime breast cancer risk is a preliminary estimate based on available information using one of several models endorsed by the [place] (ACS). The ACS recommends consideration of breast MRI screening as an adjunct  to mammography for patients at high risk (defined as 20% or greater lifetime risk). A more detailed breast cancer risk assessment can be considered, if clinically indicated. This risk estimate can change over time and that this calculation may be repeated to reflect new information in her personal or family history in the future.     RECOMMENDATIONS FOR FAMILY MEMBERS:  Individuals in this family might be at some increased risk of developing cancer, over the general population risk, simply due to the family history of cancer.  We recommended women in this family have a yearly mammogram beginning at age 32, or 107 years younger than the earliest onset of cancer, an annual clinical breast exam, and perform monthly breast self-exams. Women in this family should also have a gynecological exam as recommended by their primary provider. All family members should be referred for colonoscopy starting at age 85.  It is also possible there is a hereditary cause for the cancer in Ms. Barron's family that she did not inherit and therefore was not identified in her.  Based on [name]'s family history, we recommended her sister Aimee [name], who was diagnosed with pancreatic cancer at age 29, have genetic counseling and testing. [name] will let us know if we can be of any assistance in coordinating genetic counseling and/or testing for this family member.   FOLLOW-UP: Lastly,  we discussed with [name] that cancer genetics is a rapidly advancing field and it is possible that new genetic tests will be appropriate for her and/or her family members in the future. We encouraged her to remain in contact with cancer genetics on an annual basis so we can update her personal and family histories and let her know of advances in cancer genetics that may benefit this family.   Our contact number was provided. [name] questions were answered to her satisfaction, and she knows she is welcome to call us at anytime with additional  questions or concerns.   Clint Guy, MS, [facility] Genetic Counselor [email or web address] Phone: [phone]

## 2020-11-26 ENCOUNTER — Other Ambulatory Visit: Payer: Self-pay

## 2020-11-26 ENCOUNTER — Encounter: Payer: Self-pay | Admitting: Physician Assistant

## 2020-11-26 DIAGNOSIS — M170 Bilateral primary osteoarthritis of knee: Secondary | ICD-10-CM

## 2020-12-08 ENCOUNTER — Ambulatory Visit: Payer: 59 | Admitting: Orthopaedic Surgery

## 2020-12-09 ENCOUNTER — Ambulatory Visit: Payer: Self-pay

## 2020-12-09 ENCOUNTER — Other Ambulatory Visit: Payer: Self-pay

## 2020-12-09 ENCOUNTER — Encounter: Payer: Self-pay | Admitting: Orthopaedic Surgery

## 2020-12-09 ENCOUNTER — Ambulatory Visit: Payer: 59 | Admitting: Orthopaedic Surgery

## 2020-12-09 DIAGNOSIS — M25561 Pain in right knee: Secondary | ICD-10-CM | POA: Diagnosis not present

## 2020-12-09 DIAGNOSIS — Z6841 Body Mass Index (BMI) 40.0 and over, adult: Secondary | ICD-10-CM

## 2020-12-09 DIAGNOSIS — M25562 Pain in left knee: Secondary | ICD-10-CM

## 2020-12-09 DIAGNOSIS — G8929 Other chronic pain: Secondary | ICD-10-CM

## 2020-12-09 MED ORDER — MELOXICAM 7.5 MG PO TABS: 7.5000 mg | ORAL_TABLET | Freq: Two times a day (BID) | ORAL | 2 refills | Status: DC | PRN

## 2020-12-09 NOTE — Progress Notes (Signed)
Office Visit Note   Patient: Aimee [name]           Date of Birth: 08-Jul-1961           MRN: [id number] Visit Date: 12/09/2020              Requested by: [name], [place] [address] [place],  [place] [address] PCP: [name], [place]   Assessment & Plan: Visit Diagnoses:  1. Chronic pain of both knees   2. Body mass index 45.0-49.9, adult (HCC)   3. Morbid obesity (HCC)     Plan: Unfortunately Ladina has undergone extensive conservative treatments for her knees and she continues to have severe pain with daily activities.  She understands that her BMI of 45 poses a surgical risk and therefore she will make all efforts at weight loss.  I sent a prescription for meloxicam to try.  She will continue to use Voltaren gel and a knee brace for activity.  She has my information for when she has gotten her weight down to an appropriate level.  Questions encouraged and answered.  Follow-Up Instructions: Return if symptoms worsen or fail to improve.   Orders:  Orders Placed This Encounter  Procedures  . XR KNEE 3 VIEW LEFT  . XR KNEE 3 VIEW RIGHT   Meds ordered this encounter  Medications  . meloxicam (MOBIC) 7.5 MG tablet    Sig: Take 1 tablet (7.5 mg total) by mouth 2 (two) times daily as needed for pain.    Dispense:  30 tablet    Refill:  2      Procedures: No procedures performed   Clinical Data: No additional findings.   Subjective: Chief Complaint  Patient presents with  . Left Knee - Pain  . Right Knee - Pain    Aimee [name] is a very pleasant 60 year old female comes in for evaluation of bilateral knee pain due to severe and advanced DJD.  She has had ongoing problems with these knees since 2014.  She has had a arthroscopic partial medial meniscectomy on the right knee about 9 years ago.  She has had problems with knee pain ever since then.  She endorses swelling at the end of the day.  She had cortisone injection at her PCPs office 2 weeks ago which has  given partial relief.  In terms of other conservative treatment she has had gel injections and knee braces which do not provide any significant relief.  She is done home exercises as well.   Review of Systems  Constitutional: Negative.   HENT: Negative.   Eyes: Negative.   Respiratory: Negative.   Cardiovascular: Negative.   Endocrine: Negative.   Musculoskeletal: Negative.   Neurological: Negative.   Hematological: Negative.   Psychiatric/Behavioral: Negative.   All other systems reviewed and are negative.    Objective: Vital Signs: There were no vitals taken for this visit.  Physical Exam Vitals and nursing note reviewed.  Constitutional:      Appearance: She is well-developed.  HENT:     Head: Normocephalic and atraumatic.  Pulmonary:     Effort: Pulmonary effort is normal.  Abdominal:     Palpations: Abdomen is soft.  Musculoskeletal:     Cervical back: Neck supple.  Skin:    General: Skin is warm.     Capillary Refill: Capillary refill takes less than 2 seconds.  Neurological:     Mental Status: She is alert and oriented to person, place, and time.  Psychiatric:  Behavior: Behavior normal.        Thought Content: Thought content normal.        Judgment: Judgment normal.     Ortho Exam Bilateral knees demonstrate severe patellofemoral crepitus and pain with range of motion.  No joint effusion.  Collaterals and cruciates are stable.  Full extension.  Flexion to 110 degrees with moderate pain.  Specialty Comments:  No specialty comments available.  Imaging: XR KNEE 3 VIEW LEFT  Result Date: 12/09/2020 Advanced tricompartment degenerative changes to the left knee.  XR KNEE 3 VIEW RIGHT  Result Date: 12/09/2020 Advanced tricompartment degenerative changes to the right knee    PMFS History: Patient Active Problem List   Diagnosis Date Noted  . Family history of pancreatic cancer   . Family history of breast cancer   . Family history of colon  cancer   . Obesity 08/12/2020  . Degenerative disc disease, cervical 11/06/2019  . Genetic testing 06/17/2019  . Degenerative arthritis of knee, bilateral 02/05/2019  . Depression, major, single episode, moderate (HCC) 10/10/2018  . PVC (premature ventricular contraction) 01/11/2018  . Knee osteoarthritis 09/25/2017  . AC separation, left, initial encounter 05/26/2017  . Acute left-sided low back pain without sciatica 05/26/2017  . Hypertension 04/06/2017  . Anxiety 04/06/2017  . Arthritis 04/06/2017   Past Medical History:  Diagnosis Date  . Anxiety   . Arthritis   . Depression   . Family history of breast cancer   . Family history of colon cancer   . Family history of pancreatic cancer   . GERD (gastroesophageal reflux disease)   . Hypertension     Family History  Problem Relation Age of Onset  . Colon cancer Mother 26  . Heart disease Father   . Hypertension Father   . Heart attack Father 55  . Stroke Father   . Diabetes Sister   . Breast cancer Sister 63  . Colon cancer Maternal Grandfather        dx >50  . Heart attack Sister   . Pancreatic cancer Sister 40  . Heart Problems Paternal Uncle     Past Surgical History:  Procedure Laterality Date  . ABDOMINAL HYSTERECTOMY  2004  . CESAREAN SECTION  2003  . KNEE ARTHROSCOPY Right 2013   Social History   Occupational History  . Not on file  Tobacco Use  . Smoking status: Never Smoker  . Smokeless tobacco: Never Used  Vaping Use  . Vaping Use: Never used  Substance and Sexual Activity  . Alcohol use: No  . Drug use: No  . Sexual activity: Yes    Birth control/protection: Surgical

## 2021-02-11 ENCOUNTER — Other Ambulatory Visit: Payer: Self-pay | Admitting: Physician Assistant

## 2021-02-22 NOTE — Progress Notes (Deleted)
[name] [address] [place] [address] Phone: [phone] Subjective:    I'm seeing this patient by the request  of:  [name], [place]  CC:   [id number]  Aimee [name] is a 60 y.o. female coming in with complaint of B knee pain. Injections given 11/16/2020. Durolane approved. Patient states   Onset-  Location Duration-  Character- Aggravating factors- Reliving factors-  Therapies tried-  Severity-     Past Medical History:  Diagnosis Date   Anxiety    Arthritis    Depression    Family history of breast cancer    Family history of colon cancer    Family history of pancreatic cancer    GERD (gastroesophageal reflux disease)    Hypertension    Past Surgical History:  Procedure Laterality Date   ABDOMINAL HYSTERECTOMY  2004   CESAREAN SECTION  2003   KNEE ARTHROSCOPY Right 2013   Social History   Socioeconomic History   Marital status: Married    Spouse name: Not on file   Number of children: Not on file   Years of education: Not on file   Highest education level: Not on file  Occupational History   Not on file  Tobacco Use   Smoking status: Never   Smokeless tobacco: Never  Vaping Use   Vaping Use: Never used  Substance and Sexual Activity   Alcohol use: No   Drug use: No   Sexual activity: Yes    Birth control/protection: Surgical  Other Topics Concern   Not on file  Social History Narrative   Goes by [employer]   Works for [employer] -- working there for 24 years   Married, 2 kids (34 and 15) and 1 grandson   Social Determinants of Corporate investment banker Strain: Not on Ship broker Insecurity: Not on file  Transportation Needs: Not on file  Physical Activity: Not on file  Stress: Not on file  Social Connections: Not on file   No Known Allergies Family History  Problem Relation Age of Onset   Colon cancer Mother 12   Heart disease Father    Hypertension Father    Heart attack Father  60   Stroke Father    Diabetes Sister    Breast cancer Sister 17   Colon cancer Maternal Grandfather        dx >50   Heart attack Sister    Pancreatic cancer Sister 64   Heart Problems Paternal Uncle     Current Outpatient Medications (Endocrine & Metabolic):    predniSONE (DELTASONE) 50 MG tablet, Take one tablet daily for the next 5 days.  Current Outpatient Medications (Cardiovascular):    amLODipine (NORVASC) 10 MG tablet, TAKE 1 TABLET(10 MG) BY MOUTH DAILY  Current Outpatient Medications (Respiratory):    budesonide-formoterol (SYMBICORT) 80-4.5 MCG/ACT inhaler, Inhale 2 puffs into the lungs 2 (two) times daily.   ipratropium (ATROVENT) 0.03 % nasal spray, Place 2 sprays into both nostrils every 12 (twelve) hours.   montelukast (SINGULAIR) 10 MG tablet, TAKE 1 TABLET(10 MG) BY MOUTH AT BEDTIME   PROAIR HFA 108 (90 Base) MCG/ACT inhaler,   Current Outpatient Medications (Analgesics):    ibuprofen (ADVIL) 200 MG tablet, Take 400 mg by mouth as needed.   meloxicam (MOBIC) 7.5 MG tablet, Take 1 tablet (7.5 mg total) by mouth 2 (two) times daily as needed for pain.   Current Outpatient Medications (Other):    ALPRAZolam (  XANAX) 0.5 MG tablet, Take 0.5 mg by mouth 4 (four) times daily as needed.    Cholecalciferol (VITAMIN D3) 1000 units CAPS, Take 1 capsule by mouth daily.   Diclofenac Sodium (PENNSAID) 2 % SOLN, Place 1 application onto the skin 2 (two) times daily.   FLUoxetine (PROZAC) 40 MG capsule, fluoxetine 40 mg capsule  TAKE 1 CAPSULE BY MOUTH EVERY DAY   gabapentin (NEURONTIN) 100 MG capsule, Take 2 capsules (200 mg total) by mouth at bedtime.   mirabegron ER (MYRBETRIQ) 50 MG TB24 tablet, Take 50 mg by mouth daily.   Multiple Vitamin (MULTIVITAMIN) tablet, Take 1 tablet by mouth daily.   nystatin cream (MYCOSTATIN), nystatin 100,000 unit/gram topical cream  APPLY EXTERNALLY TO THE AFFECTED AREA TWICE DAILY IN THE MORNING AND EVENING   PANTOPRAZOLE SODIUM PO, Take by  mouth daily as needed.   PREVIDENT 5000 SENSITIVE 1.1-5 % PSTE, See admin instructions.   triamcinolone (KENALOG) 0.1 %, SMARTSIG:Sparingly Topical Twice Daily   Vitamin D, Ergocalciferol, (DRISDOL) 1.25 MG (50000 UNIT) CAPS capsule, TAKE 1 CAPSULE BY MOUTH EVERY WEEK   Reviewed prior external information including notes and imaging from  primary care provider As well as notes that were available from care everywhere and other healthcare systems.  Past medical history, social, surgical and family history all reviewed in electronic medical record.  No pertanent information unless stated regarding to the chief complaint.   Review of Systems:  No headache, visual changes, nausea, vomiting, diarrhea, constipation, dizziness, abdominal pain, skin rash, fevers, chills, night sweats, weight loss, swollen lymph nodes, body aches, joint swelling, chest pain, shortness of breath, mood changes. POSITIVE muscle aches  Objective  There were no vitals taken for this visit.   General: No apparent distress alert and oriented x3 mood and affect normal, dressed appropriately.  HEENT: Pupils equal, extraocular movements intact  Respiratory: Patient's speak in full sentences and does not appear short of breath  Cardiovascular: No lower extremity edema, non tender, no erythema  Gait normal with good balance and coordination.  MSK:  Non tender with full range of motion and good stability and symmetric strength and tone of shoulders, elbows, wrist, hip, knee and ankles bilaterally.     Impression and Recommendations:     The above documentation has been reviewed and is accurate and complete Wilford Grist

## 2021-02-25 ENCOUNTER — Ambulatory Visit: Payer: 59 | Admitting: Family Medicine

## 2021-03-31 NOTE — Progress Notes (Signed)
[name] [address] [place] [address] Phone: [phone] Subjective:   [name], am serving as a scribe for Dr. Antoine Primas. This visit occurred during the SARS-CoV-2 public health emergency.  Safety protocols were in place, including screening questions prior to the visit, additional usage of staff PPE, and extensive cleaning of exam room while observing appropriate contact time as indicated for disinfecting solutions.   I'm seeing this patient by the request  of:  [name], [place]  CC: Bilateral knee pain follow-up  [id number]  11/16/2020 Injected knees today See me again in 3 months  Update 04/01/2021 Aimee [name] is a 60 y.o. female coming in with complaint of B knee pain. Patient has seen Dr. Roda Shutters since last visit.  Patient was seen told her weight would not allow her to have surgery.  Prescribed meloxicam. Patient states that L is worse than R today. Patient states that R knee is doing better than it was. Did not have injections from Dr. Roda Shutters.       Past Medical History:  Diagnosis Date   Anxiety    Arthritis    Depression    Family history of breast cancer    Family history of colon cancer    Family history of pancreatic cancer    GERD (gastroesophageal reflux disease)    Hypertension    Past Surgical History:  Procedure Laterality Date   ABDOMINAL HYSTERECTOMY  2004   CESAREAN SECTION  2003   KNEE ARTHROSCOPY Right 2013   Social History   Socioeconomic History   Marital status: Married    Spouse name: Not on file   Number of children: Not on file   Years of education: Not on file   Highest education level: Not on file  Occupational History   Not on file  Tobacco Use   Smoking status: Never   Smokeless tobacco: Never  Vaping Use   Vaping Use: Never used  Substance and Sexual Activity   Alcohol use: No   Drug use: No   Sexual activity: Yes    Birth control/protection: Surgical  Other Topics  Concern   Not on file  Social History Narrative   Goes by [employer]   Works for [employer] -- working there for 24 years   Married, 2 kids (34 and 15) and 1 grandson   Social Determinants of Corporate investment banker Strain: Not on Ship broker Insecurity: Not on file  Transportation Needs: Not on file  Physical Activity: Not on file  Stress: Not on file  Social Connections: Not on file   No Known Allergies Family History  Problem Relation Age of Onset   Colon cancer Mother 6   Heart disease Father    Hypertension Father    Heart attack Father 75   Stroke Father    Diabetes Sister    Breast cancer Sister 47   Colon cancer Maternal Grandfather        dx >50   Heart attack Sister    Pancreatic cancer Sister 24   Heart Problems Paternal Uncle     Current Outpatient Medications (Endocrine & Metabolic):    predniSONE (DELTASONE) 50 MG tablet, Take one tablet daily for the next 5 days.  Current Outpatient Medications (Cardiovascular):    amLODipine (NORVASC) 10 MG tablet, TAKE 1 TABLET(10 MG) BY MOUTH DAILY  Current Outpatient Medications (Respiratory):    budesonide-formoterol (SYMBICORT) 80-4.5 MCG/ACT inhaler, Inhale 2 puffs into  the lungs 2 (two) times daily.   ipratropium (ATROVENT) 0.03 % nasal spray, Place 2 sprays into both nostrils every 12 (twelve) hours.   montelukast (SINGULAIR) 10 MG tablet, TAKE 1 TABLET(10 MG) BY MOUTH AT BEDTIME   PROAIR HFA 108 (90 Base) MCG/ACT inhaler,   Current Outpatient Medications (Analgesics):    ibuprofen (ADVIL) 200 MG tablet, Take 400 mg by mouth as needed.   meloxicam (MOBIC) 7.5 MG tablet, Take 1 tablet (7.5 mg total) by mouth 2 (two) times daily as needed for pain.   Current Outpatient Medications (Other):    ALPRAZolam (XANAX) 0.5 MG tablet, Take 0.5 mg by mouth 4 (four) times daily as needed.    Cholecalciferol (VITAMIN D3) 1000 units CAPS, Take 1 capsule by mouth daily.   Diclofenac Sodium (PENNSAID) 2 % SOLN, Place  1 application onto the skin 2 (two) times daily.   FLUoxetine (PROZAC) 40 MG capsule, fluoxetine 40 mg capsule  TAKE 1 CAPSULE BY MOUTH EVERY DAY   gabapentin (NEURONTIN) 100 MG capsule, Take 2 capsules (200 mg total) by mouth at bedtime.   mirabegron ER (MYRBETRIQ) 50 MG TB24 tablet, Take 50 mg by mouth daily.   Multiple Vitamin (MULTIVITAMIN) tablet, Take 1 tablet by mouth daily.   nystatin cream (MYCOSTATIN), nystatin 100,000 unit/gram topical cream  APPLY EXTERNALLY TO THE AFFECTED AREA TWICE DAILY IN THE MORNING AND EVENING   PANTOPRAZOLE SODIUM PO, Take by mouth daily as needed.   PREVIDENT 5000 SENSITIVE 1.1-5 % PSTE, See admin instructions.   triamcinolone (KENALOG) 0.1 %, SMARTSIG:Sparingly Topical Twice Daily   Vitamin D, Ergocalciferol, (DRISDOL) 1.25 MG (50000 UNIT) CAPS capsule, TAKE 1 CAPSULE BY MOUTH EVERY WEEK   Reviewed prior external information including notes and imaging from  primary care provider As well as notes that were available from care everywhere and other healthcare systems.  Past medical history, social, surgical and family history all reviewed in electronic medical record.  No pertanent information unless stated regarding to the chief complaint.   Review of Systems:  No headache, visual changes, nausea, vomiting, diarrhea, constipation, dizziness, abdominal pain, skin rash, fevers, chills, night sweats, weight loss, swollen lymph nodes, body aches, joint swelling, chest pain, shortness of breath, mood changes. POSITIVE muscle aches  Objective  Blood pressure 128/86, pulse 78, height 5\' 3"  (1.6 m), weight 245 lb (111.1 kg), SpO2 98 %.   General: No apparent distress alert and oriented x3 mood and affect normal, dressed appropriately.  HEENT: Pupils equal, extraocular movements intact  Respiratory: Patient's speak in full sentences and does not appear short of breath  Cardiovascular: No lower extremity edema, non tender, no erythema  Gait normal with good  balance and coordination.  MSK: Knee exam bilaterally shows the patient does have abnormal thigh to calf ratio.  Instability noted with valgus and varus force.  Effusion noted of the left knee comparatively.  Lacks last 5 degrees of flexion and extension on the left and just the extension on the right.  Neurovascularly intact distally.  After informed written and verbal consent, patient was seated on exam table. Right knee was prepped with alcohol swab and utilizing anterolateral approach, patient's right knee space was injected with 4:1  marcaine 0.5%: Kenalog 40mg /dL. Patient tolerated the procedure well without immediate complications.  After informed written and verbal consent, patient was seated on exam table. Left knee was prepped with alcohol swab and utilizing anterolateral approach, patient's left knee space was injected with 4:1  marcaine 0.5%: Kenalog 40mg /dL. Patient  tolerated the procedure well without immediate complications.    Impression and Recommendations:     The above documentation has been reviewed and is accurate and complete Judi Saa, DO

## 2021-04-01 ENCOUNTER — Other Ambulatory Visit: Payer: Self-pay

## 2021-04-01 ENCOUNTER — Ambulatory Visit: Payer: 59 | Admitting: Family Medicine

## 2021-04-01 DIAGNOSIS — M170 Bilateral primary osteoarthritis of knee: Secondary | ICD-10-CM | POA: Diagnosis not present

## 2021-04-01 NOTE — Patient Instructions (Addendum)
See me in 10 weeks  Will get gel approved if needed

## 2021-04-01 NOTE — Assessment & Plan Note (Signed)
Bilateral injections given today.  Patient is a chronic problem with exacerbation.  Patient is doing relatively well with the weight loss.  Encouraged her to continue to work toward.  Patient will be a surgical candidate once her BMI is under 40.  Patient seems highly motivated and I am encouraged by this.  Follow-up with me otherwise in 10 to 12 weeks.  Can consider the possibility of viscosupplementation as well.

## 2021-04-12 ENCOUNTER — Other Ambulatory Visit: Payer: Self-pay | Admitting: Physician Assistant

## 2021-05-05 ENCOUNTER — Other Ambulatory Visit: Payer: Self-pay | Admitting: Obstetrics and Gynecology

## 2021-05-05 DIAGNOSIS — N63 Unspecified lump in unspecified breast: Secondary | ICD-10-CM

## 2021-05-19 ENCOUNTER — Ambulatory Visit
Admission: RE | Admit: 2021-05-19 | Discharge: 2021-05-19 | Disposition: A | Discharge status: Home / Self Care | Payer: 59 | Source: Ambulatory Visit | Attending: Obstetrics and Gynecology | Admitting: Obstetrics and Gynecology

## 2021-05-19 ENCOUNTER — Other Ambulatory Visit: Payer: Self-pay

## 2021-05-19 DIAGNOSIS — N63 Unspecified lump in unspecified breast: Secondary | ICD-10-CM

## 2021-06-10 ENCOUNTER — Ambulatory Visit: Payer: 59 | Admitting: Family Medicine

## 2021-07-16 NOTE — Progress Notes (Signed)
[name] [address] [place] [address] Phone: [phone] Subjective:   Aimee [name], am serving as a scribe for Dr. Antoine Primas. This visit occurred during the SARS-CoV-2 public health emergency.  Safety protocols were in place, including screening questions prior to the visit, additional usage of staff PPE, and extensive cleaning of exam room while observing appropriate contact time as indicated for disinfecting solutions.   I'm seeing this patient by the request  of:  Aimee [name], [place]  CC: Bilateral knee pain  [id number]  04/01/2021 Bilateral injections given today.  Patient is a chronic problem with exacerbation.  Patient is doing relatively well with the weight loss.  Encouraged her to continue to work toward.  Patient will be a surgical candidate once her BMI is under 40.  Patient seems highly motivated and I am encouraged by this.  Follow-up with me otherwise in 10 to 12 weeks.  Can consider the possibility of viscosupplementation as well.  Updated 07/19/2021 Aimee [name] is a 60 y.o. female coming in with complaint of bilateral knee pain. Knee pain has improved. Does have some days that are more painful. Not as stiff as usual but has a lot of popping. Continues to do pool workouts.  Noticing more stiffness recently.  Having some very mild increase in instability.     Past Medical History:  Diagnosis Date   Anxiety    Arthritis    Depression    Family history of breast cancer    Family history of colon cancer    Family history of pancreatic cancer    GERD (gastroesophageal reflux disease)    Hypertension    Past Surgical History:  Procedure Laterality Date   ABDOMINAL HYSTERECTOMY  2004   CESAREAN SECTION  2003   KNEE ARTHROSCOPY Right 2013   Social History   Socioeconomic History   Marital status: Married    Spouse name: Not on file   Number of children: Not on file   Years of education: Not on file   Highest  education level: Not on file  Occupational History   Not on file  Tobacco Use   Smoking status: Never   Smokeless tobacco: Never  Vaping Use   Vaping Use: Never used  Substance and Sexual Activity   Alcohol use: No   Drug use: No   Sexual activity: Yes    Birth control/protection: Surgical  Other Topics Concern   Not on file  Social History Narrative   Goes by Aimee [name]   Works for [employer]e [name] -- working there for 24 years   Married, 2 kids (34 and 15) and 1 grandson   Social Determinants of Corporate investment banker Strain: Not on file  Food Insecurity: Not on file  Transportation Needs: Not on file  Physical Activity: Not on file  Stress: Not on file  Social Connections: Not on file   No Known Allergies Family History  Problem Relation Age of Onset   Colon cancer Mother 57   Heart disease Father    Hypertension Father    Heart attack Father 43   Stroke Father    Diabetes Sister    Breast cancer Sister 56   Colon cancer Maternal Grandfather        dx >50   Heart attack Sister    Pancreatic cancer Sister 67   Heart Problems Paternal Uncle     Current Outpatient Medications (Endocrine & Metabolic):    predniSONE (  DELTASONE) 50 MG tablet, Take one tablet daily for the next 5 days.  Current Outpatient Medications (Cardiovascular):    amLODipine (NORVASC) 10 MG tablet, TAKE 1 TABLET(10 MG) BY MOUTH DAILY  Current Outpatient Medications (Respiratory):    budesonide-formoterol (SYMBICORT) 80-4.5 MCG/ACT inhaler, Inhale 2 puffs into the lungs 2 (two) times daily.   ipratropium (ATROVENT) 0.03 % nasal spray, Place 2 sprays into both nostrils every 12 (twelve) hours.   montelukast (SINGULAIR) 10 MG tablet, TAKE 1 TABLET(10 MG) BY MOUTH AT BEDTIME   PROAIR HFA 108 (90 Base) MCG/ACT inhaler,   Current Outpatient Medications (Analgesics):    ibuprofen (ADVIL) 200 MG tablet, Take 400 mg by mouth as needed.   meloxicam (MOBIC) 7.5 MG tablet, Take 1 tablet (7.5 mg  total) by mouth 2 (two) times daily as needed for pain.   Current Outpatient Medications (Other):    ALPRAZolam (XANAX) 0.5 MG tablet, Take 0.5 mg by mouth 4 (four) times daily as needed.    Cholecalciferol (VITAMIN D3) 1000 units CAPS, Take 1 capsule by mouth daily.   Diclofenac Sodium (PENNSAID) 2 % SOLN, Place 1 application onto the skin 2 (two) times daily.   FLUoxetine (PROZAC) 40 MG capsule, fluoxetine 40 mg capsule  TAKE 1 CAPSULE BY MOUTH EVERY DAY   gabapentin (NEURONTIN) 100 MG capsule, Take 2 capsules (200 mg total) by mouth at bedtime.   mirabegron ER (MYRBETRIQ) 50 MG TB24 tablet, Take 50 mg by mouth daily.   Multiple Vitamin (MULTIVITAMIN) tablet, Take 1 tablet by mouth daily.   nystatin cream (MYCOSTATIN), nystatin 100,000 unit/gram topical cream  APPLY EXTERNALLY TO THE AFFECTED AREA TWICE DAILY IN THE MORNING AND EVENING   PANTOPRAZOLE SODIUM PO, Take by mouth daily as needed.   PREVIDENT 5000 SENSITIVE 1.1-5 % PSTE, See admin instructions.   triamcinolone (KENALOG) 0.1 %, SMARTSIG:Sparingly Topical Twice Daily   Vitamin D, Ergocalciferol, (DRISDOL) 1.25 MG (50000 UNIT) CAPS capsule, TAKE 1 CAPSULE BY MOUTH EVERY WEEK   Reviewed prior external information including notes and imaging from  primary care provider As well as notes that were available from care everywhere and other healthcare systems.  Past medical history, social, surgical and family history all reviewed in electronic medical record.  No pertanent information unless stated regarding to the chief complaint.   Review of Systems:  No headache, visual changes, nausea, vomiting, diarrhea, constipation, dizziness, abdominal pain, skin rash, fevers, chills, night sweats, weight loss, swollen lymph nodes, body aches, joint swelling, chest pain, shortness of breath, mood changes. POSITIVE muscle aches  Objective  Blood pressure 120/86, pulse 79, height 5\' 3"  (1.6 m), weight 248 lb (112.5 kg), SpO2 97 %.   General:  No apparent distress alert and oriented x3 mood and affect normal, dressed appropriately.  HEENT: Pupils equal, extraocular movements intact  Respiratory: Patient's speak in full sentences and does not appear short of breath  Cardiovascular: No lower extremity edema, non tender, no erythema  Gait antalgic gait Knee: Bilateral valgus deformity noted. Large thigh to calf ratio.  Tender to palpation over medial and PF joint line.  ROM lacking the last 5 degrees of flexion bilaterally instability with valgus force.  painful patellar compression. Patellar glide with moderate crepitus. Patellar and quadriceps tendons unremarkable. Hamstring and quadriceps strength is normal.  After informed written and verbal consent, patient was seated on exam table. Right knee was prepped with alcohol swab and utilizing anterolateral approach, patient's right knee space was injected with 4:1  marcaine 0.5%: Kenalog 40mg /dL.  Patient tolerated the procedure well without immediate complications.  After informed written and verbal consent, patient was seated on exam table. Left knee was prepped with alcohol swab and utilizing anterolateral approach, patient's left knee space was injected with 4:1  marcaine 0.5%: Kenalog 40mg /dL. Patient tolerated the procedure well without immediate complications.   Impression and Recommendations:     The above documentation has been reviewed and is accurate and complete , DO

## 2021-07-19 ENCOUNTER — Encounter: Payer: Self-pay | Admitting: Family Medicine

## 2021-07-19 ENCOUNTER — Other Ambulatory Visit: Payer: Self-pay

## 2021-07-19 ENCOUNTER — Ambulatory Visit (INDEPENDENT_AMBULATORY_CARE_PROVIDER_SITE_OTHER): Payer: 59 | Admitting: Family Medicine

## 2021-07-19 DIAGNOSIS — M170 Bilateral primary osteoarthritis of knee: Secondary | ICD-10-CM | POA: Diagnosis not present

## 2021-07-19 NOTE — Assessment & Plan Note (Signed)
Patient does have knee arthritis bilaterally.  Chronic problem with exacerbation.  Patient responds better to the steroid injection she feels.  Wants to continue these at the moment.  Encouraged her to continue to work on her weight.  Patient has a goal of getting 1 or 200 pounds.  Patient at this moment would not be a surgical candidate for her knees until she does lose weight.  Patient will follow-up with me again in 3 months

## 2021-07-19 NOTE — Patient Instructions (Addendum)
Injected both knees today Happy Holidays See me again in 2 months

## 2021-08-18 ENCOUNTER — Telehealth (INDEPENDENT_AMBULATORY_CARE_PROVIDER_SITE_OTHER): Payer: 59 | Admitting: Physician Assistant

## 2021-08-18 DIAGNOSIS — U071 COVID-19: Secondary | ICD-10-CM

## 2021-08-18 DIAGNOSIS — I10 Essential (primary) hypertension: Secondary | ICD-10-CM

## 2021-08-18 MED ORDER — BENZONATATE 100 MG PO CAPS: 100.0000 mg | ORAL_CAPSULE | Freq: Three times a day (TID) | ORAL | 0 refills | Status: AC | PRN

## 2021-08-18 MED ORDER — MOLNUPIRAVIR EUA 200MG CAPSULE: 4.0000 | ORAL_CAPSULE | Freq: Two times a day (BID) | ORAL | 0 refills | Status: AC

## 2021-08-18 NOTE — Progress Notes (Signed)
Virtual Visit via Video Note  I connected with  Aimee [name]  on 08/18/21 at 11:45 AM EST by a video enabled telemedicine application and verified that I am speaking with the correct person using two identifiers.  Location: Patient: home Provider: Nature conservation officer at [employer] Persons present: Patient and myself   I discussed the limitations of evaluation and management by telemedicine and the availability of in person appointments. The patient expressed understanding and agreed to proceed.   History of Present Illness:  Chief complaint: COVID-19 positive on 08/17/2021 via home test Symptom onset: 08/16/2021 (2 days ago) Pertinent positives: Headache, cough, congestion Pertinent negatives: SOB, CP, N/V/D  Treatments tried: Coricidin HBP  Vaccine status: UTD on all COVID-19 vaccinations Sick exposure: Works for the [employer]; recently at her brother's funeral over the weekend as well      Observations/Objective:  Temp 99.9 F BP 152/89  Gen: Awake, alert, no acute distress, very congested Resp: Breathing is even and non-labored Psych: calm/pleasant demeanor Neuro: Alert and Oriented x 3, + facial symmetry, speech is clear.   Assessment and Plan:  1. COVID-19 -Diagnosis confirmed via home antigen test.  Patient is currently having mild to moderate symptoms.  We discussed current algorithm recommendations for prescribing outpatient antivirals.  As the patient is within the 5 day window of onset of symptoms, she could benefit from antiviral therapy. Risks versus benefits discussed.  Agreed on Molnupiravir with possible SE discussed. She will continue Coricidin HBP and also use tessalon perles prn cough.  Advised self-isolation at home for the next 5 days and then masking around others for at least an additional 5 days.  Treat supportively at this time including sleeping prone, deep breathing exercises, pushing fluids, walking every few hours, vitamins C and D, and  Tylenol or ibuprofen as needed.  The patient understands that COVID-19 illness can wax and wane.  Should the symptoms acutely worsen or patient starts to experience sudden shortness of breath, chest pain, severe weakness, the patient will go straight to the emergency department.  Also advised home pulse oximetry monitoring and for any reading consistently under 92%, should also report to the emergency department.  The patient will continue to keep [place] updated.   2. Hypertension, unspecified type -Elevated, most likely due to recent Alka-seltzer (before she started on the Coricidin HBP) -She will monitor and call if any changes    Follow Up Instructions:    I discussed the assessment and treatment plan with the patient. The patient was provided an opportunity to ask questions and all were answered. The patient agreed with the plan and demonstrated an understanding of the instructions.   The patient was advised to call back or seek an in-person evaluation if the symptoms worsen or if the condition fails to improve as anticipated.   M , PA-C

## 2021-08-30 LAB — HM MAMMOGRAPHY

## 2021-09-20 NOTE — Progress Notes (Incomplete)
SCRIBE STATEMENT   Subjective:    Aimee [name] is a 61 y.o. female and is here for a comprehensive physical exam.   HPI  Health Maintenance Due  Topic Date Due   Zoster Vaccines- Shingrix (1 of 2) Never done   COVID-19 Vaccine (3 - Booster for Moderna series) 01/21/2020   MAMMOGRAM  05/26/2020   INFLUENZA VACCINE  03/15/2021    Acute Concerns: ***  Chronic Issues: HTN Currently taking morvasc 5 mg daily with no adverse effects. At home blood pressure readings are: ***. Patient denies chest pain, SOB, blurred vision, dizziness, unusual headaches, lower leg swelling. Patient is *** compliant with medication. Denies excessive caffeine intake, stimulant usage, excessive alcohol intake, or increase in salt consumption.  BP Readings from Last 3 Encounters:  07/19/21 120/86  04/01/21 128/86  11/16/20 120/82   Aimee [name] is currently compliant with taking prozac 40 mg daily and xanax 0.5 mg daily as needed with no adverse effects. Pt expresses that this medication has *** been beneficial to them. At this time *** is tolerating well. Denies SI/HI.    Health Maintenance: Immunizations -- Covid- UTD Influenza- Due; 2021 Tdap- UTD;2019 Colonoscopy -- UTD;2020 Mammogram -- Due;2020 PAP -- Due  Bone Density -- N/A Diet -- *** Sleep habits -- *** Exercise -- *** Weight -- *** Mood -- *** Weight history: Wt Readings from Last 10 Encounters:  07/19/21 248 lb (112.5 kg)  04/01/21 245 lb (111.1 kg)  11/16/20 256 lb (116.1 kg)  11/09/20 255 lb 9.6 oz (115.9 kg)  08/12/20 267 lb (121.1 kg)  04/23/20 268 lb (121.6 kg)  12/03/19 267 lb (121.1 kg)  11/06/19 267 lb (121.1 kg)  11/06/19 267 lb 4 oz (121.2 kg)  07/02/19 265 lb (120.2 kg)   There is no height or weight on file to calculate BMI. No LMP recorded. Patient has had a hysterectomy. Alcohol use:  reports no history of alcohol use. Tobacco use:  Tobacco Use: Low Risk    Smoking Tobacco Use: Never   Smokeless Tobacco  Use: Never   Passive Exposure: Not on file     Depression screen [facility] 2/9 11/09/2020  Decreased Interest 0  Down, Depressed, Hopeless 0  PHQ - 2 Score 0  Altered sleeping -  Tired, decreased energy -  Change in appetite -  Feeling bad or failure about yourself  -  Trouble concentrating -  Moving slowly or fidgety/restless -  Suicidal thoughts -  PHQ-9 Score -  Difficult doing work/chores -     Other providers/specialists: Patient Care Team: [name], [place] as PCP - General (Physician Assistant)    PMHx, SurgHx, SocialHx, Medications, and Allergies were reviewed in the Visit Navigator and updated as appropriate.   Past Medical History:  Diagnosis Date   Anxiety    Arthritis    Depression    Family history of breast cancer    Family history of colon cancer    Family history of pancreatic cancer    GERD (gastroesophageal reflux disease)    Hypertension      Past Surgical History:  Procedure Laterality Date   ABDOMINAL HYSTERECTOMY  2004   CESAREAN SECTION  2003   KNEE ARTHROSCOPY Right 2013     Family History  Problem Relation Age of Onset   Colon cancer Mother 14   Heart disease Father    Hypertension Father    Heart attack Father 16   Stroke Father    Diabetes Sister    Breast  cancer Sister 58   Colon cancer Maternal Grandfather        dx >50   Heart attack Sister    Pancreatic cancer Sister 39   Heart Problems Paternal Uncle     Social History   Tobacco Use   Smoking status: Never   Smokeless tobacco: Never  Vaping Use   Vaping Use: Never used  Substance Use Topics   Alcohol use: No   Drug use: No    Review of Systems:   ROS Negative unless otherwise specified per HPI. Objective:   There were no vitals taken for this visit. There is no height or weight on file to calculate BMI.   General Appearance:    Alert, cooperative, no distress, appears stated age  Head:    Normocephalic, without obvious abnormality, atraumatic  Eyes:     PERRL, conjunctiva/corneas clear, EOM's intact, fundi    benign, both eyes  Ears:    Normal TM's and external ear canals, both ears  Nose:   Nares normal, septum midline, mucosa normal, no drainage    or sinus tenderness  Throat:   Lips, mucosa, and tongue normal; teeth and gums normal  Neck:   Supple, symmetrical, trachea midline, no adenopathy;    thyroid:  no enlargement/tenderness/nodules; no carotid   bruit or JVD  Back:     Symmetric, no curvature, ROM normal, no CVA tenderness  Lungs:     Clear to auscultation bilaterally, respirations unlabored  Chest Wall:    No tenderness or deformity   Heart:    Regular rate and rhythm, S1 and S2 normal, no murmur, rub or gallop  Breast Exam:    Deferred  Abdomen:     Soft, non-tender, bowel sounds active all four quadrants,    no masses, no organomegaly  Genitalia:    Deferred  Extremities:   Extremities normal, atraumatic, no cyanosis or edema  Pulses:   2+ and symmetric all extremities  Skin:   Skin color, texture, turgor normal, no rashes or lesions  Lymph nodes:   Cervical, supraclavicular, and axillary nodes normal  Neurologic:   CNII-XII intact, normal strength, sensation and reflexes    throughout    Assessment/Plan:     Patient Counseling: [x]    Nutrition: Stressed importance of moderation in sodium/caffeine intake, saturated fat and cholesterol, caloric balance, sufficient intake of fresh fruits, vegetables, fiber, calcium, iron, and 1 mg of folate supplement per day (for females capable of pregnancy).  [x]    Stressed the importance of regular exercise.   [x]    Substance Abuse: Discussed cessation/primary prevention of tobacco, alcohol, or other drug use; driving or other dangerous activities under the influence; availability of treatment for abuse.   [x]    Injury prevention: Discussed safety belts, safety helmets, smoke detector, smoking near bedding or upholstery.   [x]    Sexuality: Discussed sexually transmitted diseases,  partner selection, use of condoms, avoidance of unintended pregnancy  and contraceptive alternatives.  [x]    Dental health: Discussed importance of regular tooth brushing, flossing, and dental visits.  [x]    Health maintenance and immunizations reviewed. Please refer to Health maintenance section.   I,Havlyn C Ratchford,acting as a for , PA.,have documented all relevant documentation on the behalf of , PA,as directed by  , PA while in the presence of , .  ***  , PA-C [facility]

## 2021-09-21 ENCOUNTER — Encounter: Payer: Self-pay | Admitting: Physician Assistant

## 2021-09-21 ENCOUNTER — Other Ambulatory Visit: Payer: Self-pay

## 2021-09-21 ENCOUNTER — Ambulatory Visit (INDEPENDENT_AMBULATORY_CARE_PROVIDER_SITE_OTHER): Payer: 59 | Admitting: Physician Assistant

## 2021-09-21 VITALS — BP 138/86 | HR 82 | Temp 98.3°F | Ht 66.14 in | Wt 239.2 lb

## 2021-09-21 DIAGNOSIS — I10 Essential (primary) hypertension: Secondary | ICD-10-CM | POA: Diagnosis not present

## 2021-09-21 DIAGNOSIS — R0789 Other chest pain: Secondary | ICD-10-CM

## 2021-09-21 NOTE — Progress Notes (Signed)
Aimee [name] is a 61 y.o. female here for a follow up of HTN.  History of Present Illness:   Chief Complaint  Patient presents with   Annual Exam    Pt being seen for annual exam; pt is fasting; pt wants to discuss weight management, family hx of congestive heart failure.    Medication Refill    Pt needs refill on meds and asking for Vitamin D3;     HTN Pt has been compliant taking norvasc 10 mg daily with no adverse effects. At home blood pressure readings are: checked regularly. States her readings do fluctuate, staying around 130/80. Patient denies chest pain, SOB, blurred vision, dizziness, unusual headaches, lower leg swelling. Denies excessive caffeine intake, stimulant usage, excessive alcohol intake, or increase in salt consumption.   BP Readings from Last 3 Encounters:  09/21/21 138/86  07/19/21 120/86  04/01/21 128/86   Chest pressure Aimee [name] states she has been experiencing sporadic left sided chest pressure for the past year. Pt describes this to be a fluttering sensation that could last anywhere from a couple of seconds to a full day. Admits this has occurred about three times within the past year, which makes her uncertain if it is just stress or something more significant at play. Additionally she has noticed that she is out of breath upon walking up stairs but has no troubles exercising on an elliptical or treadmill for 30 minutes with no problems.   Due to significant Fhx of heart disease, she would like to compete a calcium score of her heart as well as an ultrasound for piece of mind. Pt is UTD with mammogram. Denies SOB, CP, or LE Swelling.   Past Medical History:  Diagnosis Date   Anxiety    Arthritis    Depression    Family history of breast cancer    Family history of colon cancer    Family history of pancreatic cancer    GERD (gastroesophageal reflux disease)    Hypertension      Social History   Tobacco Use   Smoking status: Never   Smokeless  tobacco: Never  Vaping Use   Vaping Use: Never used  Substance Use Topics   Alcohol use: No   Drug use: No    Past Surgical History:  Procedure Laterality Date   ABDOMINAL HYSTERECTOMY  2004   CESAREAN SECTION  2003   KNEE ARTHROSCOPY Right 2013    Family History  Problem Relation Age of Onset   Colon cancer Mother 43   Cancer Mother    Diabetes Mother    Heart disease Father    Hypertension Father    Heart attack Father 71   Stroke Father    Diabetes Sister    Breast cancer Sister 57   Colon cancer Maternal Grandfather        dx >50   Heart attack Sister    Pancreatic cancer Sister 28   Heart Problems Paternal Uncle    Heart disease Brother     No Known Allergies  Current Medications:   Current Outpatient Medications:    ALPRAZolam (XANAX) 0.5 MG tablet, Take 0.5 mg by mouth 4 (four) times daily as needed. , Disp: , Rfl:    amLODipine (NORVASC) 10 MG tablet, TAKE 1 TABLET(10 MG) BY MOUTH DAILY, Disp: 90 tablet, Rfl: 1   Cholecalciferol (VITAMIN D3) 1000 units CAPS, Take 1 capsule by mouth daily., Disp: , Rfl:    FLUoxetine (PROZAC) 40 MG capsule, fluoxetine 40 mg  capsule  TAKE 1 CAPSULE BY MOUTH EVERY DAY, Disp: , Rfl:    ibuprofen (ADVIL) 200 MG tablet, Take 400 mg by mouth as needed., Disp: , Rfl:    ipratropium (ATROVENT) 0.03 % nasal spray, Place 2 sprays into both nostrils every 12 (twelve) hours., Disp: 30 mL, Rfl: 12   mirabegron ER (MYRBETRIQ) 50 MG TB24 tablet, Take 50 mg by mouth daily., Disp: , Rfl:    montelukast (SINGULAIR) 10 MG tablet, TAKE 1 TABLET(10 MG) BY MOUTH AT BEDTIME, Disp: 90 tablet, Rfl: 1   Multiple Vitamin (MULTIVITAMIN) tablet, Take 1 tablet by mouth daily., Disp: , Rfl:    PROAIR HFA 108 (90 Base) MCG/ACT inhaler, , Disp: , Rfl:    triamcinolone (KENALOG) 0.1 %, SMARTSIG:Sparingly Topical Twice Daily, Disp: , Rfl:    Vitamin D, Ergocalciferol, (DRISDOL) 1.25 MG (50000 UNIT) CAPS capsule, TAKE 1 CAPSULE BY MOUTH EVERY WEEK, Disp: 4  capsule, Rfl: 0   gabapentin (NEURONTIN) 100 MG capsule, Take 2 capsules (200 mg total) by mouth at bedtime. (Patient not taking: Reported on 09/21/2021), Disp: 180 capsule, Rfl: 0   Review of Systems:   ROS Negative unless otherwise specified per HPI. Vitals:   Vitals:   09/21/21 0756  BP: 138/86  Pulse: 82  Temp: 98.3 F (36.8 C)  TempSrc: Temporal  SpO2: 98%  Weight: 239 lb 3.2 oz (108.5 kg)  Height: 5' 6.14" (1.68 m)     Body mass index is 38.44 kg/m.  Physical Exam:   Physical Exam Vitals and nursing note reviewed.  Constitutional:      General: She is not in acute distress.    Appearance: She is well-developed. She is not ill-appearing or toxic-appearing.  Cardiovascular:     Rate and Rhythm: Normal rate and regular rhythm.     Pulses: Normal pulses.     Heart sounds: Normal heart sounds, S1 normal and S2 normal.  Pulmonary:     Effort: Pulmonary effort is normal.     Breath sounds: Normal breath sounds.  Skin:    General: Skin is warm and dry.  Neurological:     Mental Status: She is alert.     GCS: GCS eye subscore is 4. GCS verbal subscore is 5. GCS motor subscore is 6.  Psychiatric:        Speech: Speech normal.        Behavior: Behavior normal. Behavior is cooperative.    Assessment and Plan:   Hypertension, unspecified type Controlled Continue Norvasc 10 mg daily  Monitor BP regularly 2-3 times a month Encouraged patient to continue participating in healthy eating and daily exercise I advised patient that if BP readings are consistently >150/100, to reach out to office and medication will be adjusted  Follow up in 2 months for physical, sooner if concerns  Chest pressure No red flags  EKG tracing is personally reviewed.  EKG notes NSR.  No acute changes.  Ordered echocardiogram and CT Calcium score for further evaluation --per patient's request  Will refer to cardiology based on results  Follow up in 2 months for CPE, sooner if  concerns  I,Havlyn C Ratchford,acting as a scribe for [employer], PA.,have documented all relevant documentation on the behalf of [name], PA,as directed by  [name], PA while in the presence of [name], [place].  I, [name], [place], have reviewed all documentation for this visit. The documentation on 09/21/21 for the exam, diagnosis, procedures, and orders are all accurate and complete.  Time spent with patient today was 25 minutes which consisted of chart review, discussing diagnosis, work up, treatment answering questions and documentation.    [name], PA-C

## 2021-09-21 NOTE — Patient Instructions (Signed)
It was great to see you!  We have ordered a calcium score of your heart (you will be contacted about this)  We have ordered an ultrasound of your heart (you will be contacted about this)  We did an EKG today  We will put in a referral for cardiology if there are ANY abnormalities  Let's follow-up in 2 months for you physical, sooner if you have concerns.  If a referral was placed today, you will be contacted for an appointment. Please note that routine referrals can sometimes take up to 3-4 weeks to process. Please call our office if you haven't heard anything after this time frame.  Take care,  [name] PA-C

## 2021-09-22 NOTE — Progress Notes (Signed)
[name] [address] [place] [address] Phone: [phone] Subjective:   Aimee [name], am serving as a scribe for Dr. Antoine Primas. This visit occurred during the SARS-CoV-2 public health emergency.  Safety protocols were in place, including screening questions prior to the visit, additional usage of staff PPE, and extensive cleaning of exam room while observing appropriate contact time as indicated for disinfecting solutions.   I'm seeing this patient by the request  of:  [name], [place]  CC: Bilateral knee pain  [id number]  07/19/2021 Patient does have knee arthritis bilaterally.  Chronic problem with exacerbation.  Patient responds better to the steroid injection she feels.  Wants to continue these at the moment.  Encouraged her to continue to work on her weight.  Patient has a goal of getting 1 or 200 pounds.  Patient at this moment would not be a surgical candidate for her knees until she does lose weight.  Patient will follow-up with me again in 3 months  Updated 09/23/2021 Aimee [name] is a 61 y.o. female coming in with complaint of bilateral knee pain. Doing okay. Injections helped. Want some today. No new complaints.  Patient has known arthritic changes of the knees.  Still working on losing weight. Patient is trying to lose enough weight to consider the possibility of surgical intervention.       Past Medical History:  Diagnosis Date   Anxiety    Arthritis    Depression    Family history of breast cancer    Family history of colon cancer    Family history of pancreatic cancer    GERD (gastroesophageal reflux disease)    Hypertension    Past Surgical History:  Procedure Laterality Date   ABDOMINAL HYSTERECTOMY  2004   CESAREAN SECTION  2003   KNEE ARTHROSCOPY Right 2013   Social History   Socioeconomic History   Marital status: Married    Spouse name: Not on file   Number of children: Not on file   Years of  education: Not on file   Highest education level: Not on file  Occupational History   Not on file  Tobacco Use   Smoking status: Never   Smokeless tobacco: Never  Vaping Use   Vaping Use: Never used  Substance and Sexual Activity   Alcohol use: No   Drug use: No   Sexual activity: Yes    Birth control/protection: Surgical  Other Topics Concern   Not on file  Social History Narrative   Works for the [employer] -- working there for 24 years   Married, 2 kids (34 and 15) and 1 grandson   Social Determinants of Corporate investment banker Strain: Not on file  Food Insecurity: Not on file  Transportation Needs: Not on file  Physical Activity: Not on file  Stress: Not on file  Social Connections: Not on file   No Known Allergies Family History  Problem Relation Age of Onset   Colon cancer Mother 82   Cancer Mother    Diabetes Mother    Heart disease Father        CHF   Hypertension Father    Heart attack Father 57   Stroke Father    Diabetes Sister    Breast cancer Sister 20   Congestive Heart Failure Sister    Heart attack Sister    Pancreatic cancer Sister 38   Heart failure Brother    Heart disease  Brother        stent   Colon cancer Maternal Grandfather        dx >50   Heart Problems Paternal Uncle      Current Outpatient Medications (Cardiovascular):    amLODipine (NORVASC) 10 MG tablet, TAKE 1 TABLET(10 MG) BY MOUTH DAILY  Current Outpatient Medications (Respiratory):    ipratropium (ATROVENT) 0.03 % nasal spray, Place 2 sprays into both nostrils every 12 (twelve) hours.   montelukast (SINGULAIR) 10 MG tablet, TAKE 1 TABLET(10 MG) BY MOUTH AT BEDTIME   PROAIR HFA 108 (90 Base) MCG/ACT inhaler,   Current Outpatient Medications (Analgesics):    ibuprofen (ADVIL) 200 MG tablet, Take 400 mg by mouth as needed.   Current Outpatient Medications (Other):    ALPRAZolam (XANAX) 0.5 MG tablet, Take 0.5 mg by mouth 4 (four) times daily as needed.     Cholecalciferol (VITAMIN D3) 1000 units CAPS, Take 1 capsule by mouth daily.   FLUoxetine (PROZAC) 40 MG capsule, fluoxetine 40 mg capsule  TAKE 1 CAPSULE BY MOUTH EVERY DAY   gabapentin (NEURONTIN) 100 MG capsule, Take 2 capsules (200 mg total) by mouth at bedtime. (Patient not taking: Reported on 09/21/2021)   mirabegron ER (MYRBETRIQ) 50 MG TB24 tablet, Take 50 mg by mouth daily.   Multiple Vitamin (MULTIVITAMIN) tablet, Take 1 tablet by mouth daily.   triamcinolone (KENALOG) 0.1 %, SMARTSIG:Sparingly Topical Twice Daily   Vitamin D, Ergocalciferol, (DRISDOL) 1.25 MG (50000 UNIT) CAPS capsule, TAKE 1 CAPSULE BY MOUTH EVERY WEEK   Reviewed prior external information including notes and imaging from  primary care provider As well as notes that were available from care everywhere and other healthcare systems.  Past medical history, social, surgical and family history all reviewed in electronic medical record.  No pertanent information unless stated regarding to the chief complaint.   Review of Systems:  No headache, visual changes, nausea, vomiting, diarrhea, constipation, dizziness, abdominal pain, skin rash, fevers, chills, night sweats, weight loss, swollen lymph nodes, body aches, joint swelling, chest pain, shortness of breath, mood changes. POSITIVE muscle aches  Objective  Blood pressure 126/84, weight 247 lb (112 kg).   General: No apparent distress alert and oriented x3 mood and affect normal, dressed appropriately.  HEENT: Pupils equal, extraocular movements intact  Respiratory: Patient's speak in full sentences and does not appear short of breath  Cardiovascular: No lower extremity edema, non tender, no erythema  Gait nonantalgic\ Knee exam does have a significant amount of instability noted with valgus and varus force.  Patient does have some tenderness to palpation noted.  Trace effusion noted of the knees bilaterally.  Lacks last 5 degrees of flexion bilaterally.  After  informed written and verbal consent, patient was seated on exam table. Right knee was prepped with alcohol swab and utilizing anterolateral approach, patient's right knee space was injected with 4:1  marcaine 0.5%: Kenalog 40mg /dL. Patient tolerated the procedure well without immediate complications.  After informed written and verbal consent, patient was seated on exam table. Left knee was prepped with alcohol swab and utilizing anterolateral approach, patient's left knee space was injected with 4:1  marcaine 0.5%: Kenalog 40mg /dL. Patient tolerated the procedure well without immediate complications.   Impression and Recommendations:     The above documentation has been reviewed and is accurate and complete , DO

## 2021-09-23 ENCOUNTER — Encounter: Payer: Self-pay | Admitting: Family Medicine

## 2021-09-23 ENCOUNTER — Ambulatory Visit: Payer: 59 | Admitting: Family Medicine

## 2021-09-23 ENCOUNTER — Other Ambulatory Visit: Payer: Self-pay

## 2021-09-23 DIAGNOSIS — M170 Bilateral primary osteoarthritis of knee: Secondary | ICD-10-CM

## 2021-09-23 NOTE — Assessment & Plan Note (Signed)
We did injection given today.  Tolerated the procedure well.  Increase activity slowly.  Discussed which exercises to do which ones to avoid.  Patient was encouraged to continue to lose weight for this chronic problem with exacerbation.  Follow-up again in 10 weeks

## 2021-09-23 NOTE — Patient Instructions (Signed)
Good to see you! Injections today You know the drill Keep up with the weight loss See you again in 10-12 weeks

## 2021-10-04 ENCOUNTER — Encounter: Payer: Self-pay | Admitting: Physician Assistant

## 2021-10-26 ENCOUNTER — Ambulatory Visit (HOSPITAL_COMMUNITY): Payer: 59 | Attending: Cardiovascular Disease

## 2021-10-26 ENCOUNTER — Other Ambulatory Visit: Payer: Self-pay

## 2021-10-26 DIAGNOSIS — I10 Essential (primary) hypertension: Secondary | ICD-10-CM

## 2021-10-26 DIAGNOSIS — R0789 Other chest pain: Secondary | ICD-10-CM

## 2021-10-26 LAB — ECHOCARDIOGRAM COMPLETE
Area-P 1/2: 3.38 cm2
S' Lateral: 2.5 cm

## 2021-10-28 ENCOUNTER — Encounter: Payer: Self-pay | Admitting: Physician Assistant

## 2021-10-28 ENCOUNTER — Other Ambulatory Visit: Payer: Self-pay | Admitting: Physician Assistant

## 2021-11-01 ENCOUNTER — Ambulatory Visit: Payer: 59 | Admitting: Cardiovascular Disease

## 2021-11-01 NOTE — Progress Notes (Deleted)
? ?No chief complaint on file. ? ?History of Present Illness: 61 yo female with history of anxiety, depression, HTN and GERD here today as a new patient for the evaluation of *** ?Echo 10/26/21 with LVEF=60-65%. No significant valve disease.  ? ?Primary Care Physician: [name], PA ? ? ?Past Medical History:  ?Diagnosis Date  ? Anxiety   ? Arthritis   ? Depression   ? Family history of breast cancer   ? Family history of colon cancer   ? Family history of pancreatic cancer   ? GERD (gastroesophageal reflux disease)   ? Hypertension   ? ? ?Past Surgical History:  ?Procedure Laterality Date  ? ABDOMINAL HYSTERECTOMY  2004  ? CESAREAN SECTION  2003  ? KNEE ARTHROSCOPY Right 2013  ? ? ?Current Outpatient Medications  ?Medication Sig Dispense Refill  ? ALPRAZolam (XANAX) 0.5 MG tablet Take 0.5 mg by mouth 4 (four) times daily as needed.     ? amLODipine (NORVASC) 10 MG tablet TAKE 1 TABLET(10 MG) BY MOUTH DAILY 90 tablet 1  ? Cholecalciferol (VITAMIN D3) 1000 units CAPS Take 1 capsule by mouth daily.    ? FLUoxetine (PROZAC) 40 MG capsule fluoxetine 40 mg capsule ? TAKE 1 CAPSULE BY MOUTH EVERY DAY    ? gabapentin (NEURONTIN) 100 MG capsule Take 2 capsules (200 mg total) by mouth at bedtime. (Patient not taking: Reported on 09/21/2021) 180 capsule 0  ? ibuprofen (ADVIL) 200 MG tablet Take 400 mg by mouth as needed.    ? ipratropium (ATROVENT) 0.03 % nasal spray Place 2 sprays into both nostrils every 12 (twelve) hours. 30 mL 12  ? mirabegron ER (MYRBETRIQ) 50 MG TB24 tablet Take 50 mg by mouth daily.    ? montelukast (SINGULAIR) 10 MG tablet TAKE 1 TABLET(10 MG) BY MOUTH AT BEDTIME 90 tablet 1  ? Multiple Vitamin (MULTIVITAMIN) tablet Take 1 tablet by mouth daily.    ? PROAIR HFA 108 (90 Base) MCG/ACT inhaler     ? triamcinolone (KENALOG) 0.1 % SMARTSIG:Sparingly Topical Twice Daily    ? Vitamin D, Ergocalciferol, (DRISDOL) 1.25 MG (50000 UNIT) CAPS capsule TAKE 1 CAPSULE BY MOUTH EVERY WEEK 4 capsule 0  ? ?No  current facility-administered medications for this visit.  ? ? ?No Known Allergies ? ?Social History  ? ?Socioeconomic History  ? Marital status: Married  ?  Spouse name: Not on file  ? Number of children: Not on file  ? Years of education: Not on file  ? Highest education level: Not on file  ?Occupational History  ? Not on file  ?Tobacco Use  ? Smoking status: Never  ? Smokeless tobacco: Never  ?Vaping Use  ? Vaping Use: Never used  ?Substance and Sexual Activity  ? Alcohol use: No  ? Drug use: No  ? Sexual activity: Yes  ?  Birth control/protection: Surgical  ?Other Topics Concern  ? Not on file  ?Social History Narrative  ? Works for [employer] -- working there for 24 years  ? Married, 2 kids (34 and 15) and 1 grandson  ? ?Social Determinants of Health  ? ?Financial Resource Strain: Not on file  ?Food Insecurity: Not on file  ?Transportation Needs: Not on file  ?Physical Activity: Not on file  ?Stress: Not on file  ?Social Connections: Not on file  ?Intimate Partner Violence: Not on file  ? ? ?Family History  ?Problem Relation Age of Onset  ? Colon cancer Mother 43  ? Cancer Mother   ?  Diabetes Mother   ? Heart disease Father   ?     CHF  ? Hypertension Father   ? Heart attack Father 53  ? Stroke Father   ? Diabetes Sister   ? Breast cancer Sister 3  ? Congestive Heart Failure Sister   ? Heart attack Sister   ? Pancreatic cancer Sister 41  ? Heart failure Brother   ? Heart disease Brother   ?     stent  ? Colon cancer Maternal Grandfather   ?     dx >50  ? Heart Problems Paternal Uncle   ? ? ?Review of Systems:  As stated in the HPI and otherwise negative.  ? ?There were no vitals taken for this visit. ? ?Physical Examination: ?General: Well developed, well nourished, NAD  ?HEENT: OP clear, mucus membranes moist  ?SKIN: warm, dry. No rashes. ?Neuro: No focal deficits  ?Musculoskeletal: Muscle strength 5/5 all ext  ?Psychiatric: Mood and affect normal  ?Neck: No JVD, no carotid bruits, no thyromegaly, no  lymphadenopathy.  ?Lungs:Clear bilaterally, no wheezes, rhonci, crackles ?Cardiovascular: Regular rate and rhythm. No murmurs, gallops or rubs. ?Abdomen:Soft. Bowel sounds present. Non-tender.  ?Extremities: No lower extremity edema. Pulses are 2 + in the bilateral DP/PT. ? ?EKG:  EKG {ACTION; IS/IS LKT:[id number]} ordered today. ?The ekg ordered today demonstrates *** ? ?Echo 10/26/21: ? 1. Left ventricular ejection fraction, by estimation, is 60 to 65%. The  ?left ventricle has normal function. The left ventricle has no regional  ?wall motion abnormalities. Left ventricular diastolic parameters are  ?consistent with Grade I diastolic  ?dysfunction (impaired relaxation).  ? 2. Right ventricular systolic function is normal. The right ventricular  ?size is normal. There is normal pulmonary artery systolic pressure.  ? 3. Left atrial size was moderately dilated.  ? 4. The mitral valve is normal in structure. Trivial mitral valve  ?regurgitation. No evidence of mitral stenosis.  ? 5. The aortic valve is tricuspid. Aortic valve regurgitation is not  ?visualized. No aortic stenosis is present.  ? 6. The inferior vena cava is normal in size with greater than 50%  ?respiratory variability, suggesting right atrial pressure of 3 mmHg.  ? ?Recent Labs: ?11/09/2020: ALT 16; BUN 14; Creatinine, Ser 0.76; Hemoglobin 11.8; Platelets 299.0; Potassium 3.8; Sodium 140  ? ?Lipid Panel ?   ?Component Value Date/Time  ? CHOL 164 11/09/2020 0915  ? TRIG 66.0 11/09/2020 0915  ? HDL 47.90 11/09/2020 0915  ? CHOLHDL 3 11/09/2020 0915  ? VLDL 13.2 11/09/2020 0915  ? LDLCALC 103 (H) 11/09/2020 0915  ? ?  ?Wt Readings from Last 3 Encounters:  ?09/23/21 247 lb (112 kg)  ?09/21/21 239 lb 3.2 oz (108.5 kg)  ?07/19/21 248 lb (112.5 kg)  ?  ? ?Other studies Reviewed: ?Additional studies/ records that were reviewed today include: ***. ?Review of the above records demonstrates: *** ? ? ?Assessment and Plan:  ? ?1.  ? ?Current medicines are reviewed at  length with the patient today.  The patient {ACTIONS; HAS/DOES NOT HAVE:19233} concerns regarding medicines. ? ?The following changes have been made:  {PLAN; NO CHANGE:13088:s} ? ?Labs/ tests ordered today include: *** ?No orders of the defined types were placed in this encounter. ? ? ? ?Disposition:   F/U with *** in {gen number [id number]} {TIME; UNITS DAY/WEEK/MONTH:19136} ? ? ?Signed, ?Verne Carrow, MD ?11/01/2021 6:47 AM    ?[facility] ?[address] [place], [place], [place]  [address] ?Phone: [phone]; Fax: [phone]  [phone]  ? ? ?

## 2021-11-09 ENCOUNTER — Ambulatory Visit (INDEPENDENT_AMBULATORY_CARE_PROVIDER_SITE_OTHER)
Admission: RE | Admit: 2021-11-09 | Discharge: 2021-11-09 | Disposition: A | Discharge status: Home / Self Care | Payer: Self-pay | Source: Ambulatory Visit | Attending: Physician Assistant | Admitting: Physician Assistant

## 2021-11-09 ENCOUNTER — Other Ambulatory Visit: Payer: Self-pay

## 2021-11-09 DIAGNOSIS — I10 Essential (primary) hypertension: Secondary | ICD-10-CM

## 2021-11-09 DIAGNOSIS — R0789 Other chest pain: Secondary | ICD-10-CM

## 2021-11-11 ENCOUNTER — Encounter: Payer: Self-pay | Admitting: Physician Assistant

## 2021-11-18 ENCOUNTER — Ambulatory Visit: Payer: 59 | Admitting: Cardiovascular Disease

## 2021-11-22 ENCOUNTER — Encounter: Payer: 59 | Admitting: Physician Assistant

## 2021-11-26 ENCOUNTER — Encounter: Payer: 59 | Admitting: Physician Assistant

## 2021-11-26 ENCOUNTER — Ambulatory Visit (INDEPENDENT_AMBULATORY_CARE_PROVIDER_SITE_OTHER): Payer: 59 | Admitting: Physician Assistant

## 2021-11-26 ENCOUNTER — Encounter: Payer: Self-pay | Admitting: Physician Assistant

## 2021-11-26 VITALS — BP 140/100 | HR 68 | Temp 98.4°F | Resp 74 | Ht 66.0 in | Wt 242.5 lb

## 2021-11-26 DIAGNOSIS — E669 Obesity, unspecified: Secondary | ICD-10-CM | POA: Diagnosis not present

## 2021-11-26 DIAGNOSIS — I10 Essential (primary) hypertension: Secondary | ICD-10-CM

## 2021-11-26 DIAGNOSIS — E785 Hyperlipidemia, unspecified: Secondary | ICD-10-CM | POA: Diagnosis not present

## 2021-11-26 DIAGNOSIS — F419 Anxiety disorder, unspecified: Secondary | ICD-10-CM

## 2021-11-26 DIAGNOSIS — F321 Major depressive disorder, single episode, moderate: Secondary | ICD-10-CM | POA: Diagnosis not present

## 2021-11-26 DIAGNOSIS — T7840XA Allergy, unspecified, initial encounter: Secondary | ICD-10-CM

## 2021-11-26 DIAGNOSIS — E559 Vitamin D deficiency, unspecified: Secondary | ICD-10-CM | POA: Diagnosis not present

## 2021-11-26 DIAGNOSIS — R29818 Other symptoms and signs involving the nervous system: Secondary | ICD-10-CM

## 2021-11-26 DIAGNOSIS — Z0000 Encounter for general adult medical examination without abnormal findings: Secondary | ICD-10-CM

## 2021-11-26 LAB — COMPREHENSIVE METABOLIC PANEL
ALT: 14 U/L (ref 0–35)
AST: 15 U/L (ref 0–37)
Albumin: 4 g/dL (ref 3.5–5.2)
Alkaline Phosphatase: 65 U/L (ref 39–117)
BUN: 14 mg/dL (ref 6–23)
CO2: 31 mEq/L (ref 19–32)
Calcium: 9 mg/dL (ref 8.4–10.5)
Chloride: 102 mEq/L (ref 96–112)
Creatinine, Ser: 0.72 mg/dL (ref 0.40–1.20)
GFR: 91.49 mL/min (ref >60.00)
Glucose, Bld: 76 mg/dL (ref 70–99)
Potassium: 3.7 mEq/L (ref 3.5–5.1)
Sodium: 138 mEq/L (ref 135–145)
Total Bilirubin: 0.5 mg/dL (ref 0.2–1.2)
Total Protein: 7.1 g/dL (ref 6.0–8.3)

## 2021-11-26 LAB — CBC WITH DIFFERENTIAL/PLATELET
Basophils Absolute: 0 10*3/uL (ref 0.0–0.1)
Basophils Relative: 0.3 % (ref 0.0–3.0)
Eosinophils Absolute: 0.1 10*3/uL (ref 0.0–0.7)
Eosinophils Relative: 2.2 % (ref 0.0–5.0)
HCT: 36.9 % (ref 36.0–46.0)
Hemoglobin: 11.7 g/dL — ABNORMAL LOW (ref 12.0–15.0)
Lymphocytes Relative: 37.1 % (ref 12.0–46.0)
Lymphs Abs: 2 10*3/uL (ref 0.7–4.0)
MCHC: 31.6 g/dL (ref 30.0–36.0)
MCV: 79.5 fl (ref 78.0–100.0)
Monocytes Absolute: 0.5 10*3/uL (ref 0.1–1.0)
Monocytes Relative: 9.4 % (ref 3.0–12.0)
Neutro Abs: 2.8 10*3/uL (ref 1.4–7.7)
Neutrophils Relative %: 51 % (ref 43.0–77.0)
Platelets: 264 10*3/uL (ref 150.0–400.0)
RBC: 4.65 Mil/uL (ref 3.87–5.11)
RDW: 15.1 % (ref 11.5–15.5)
WBC: 5.5 10*3/uL (ref 4.0–10.5)

## 2021-11-26 LAB — LIPID PANEL
Cholesterol: 158 mg/dL (ref 0–200)
HDL: 51.2 mg/dL (ref >39.00)
LDL Cholesterol: 96 mg/dL (ref 0–99)
NonHDL: 106.43
Total CHOL/HDL Ratio: 3
Triglycerides: 52 mg/dL (ref 0.0–149.0)
VLDL: 10.4 mg/dL (ref 0.0–40.0)

## 2021-11-26 LAB — VITAMIN D 25 HYDROXY (VIT D DEFICIENCY, FRACTURES): VITD: 34.21 ng/mL (ref 30.00–100.00)

## 2021-11-26 MED ORDER — MONTELUKAST SODIUM 10 MG PO TABS: ORAL_TABLET | ORAL | 1 refills | Status: DC

## 2021-11-26 NOTE — Progress Notes (Signed)
? ?Subjective:  ?  ?Aimee [name] is a 61 y.o. female and is here for a comprehensive physical exam. ? ?HPI ? ?There are no preventive care reminders to display for this patient. ? ?Acute Concerns: ?None reported.  ? ?Chronic Issues: ?HTN; Suspected Sleep Apnea ?Currently taking norvasc 10 mg daily with no complications. At home blood pressure readings are: anywhere from 138-88 at home to 140/87 at work. Patient denies chest pain, SOB, blurred vision, dizziness, unusual headaches, lower leg swelling. Patient is compliant with medication. Denies excessive caffeine intake, stimulant usage, excessive alcohol intake, or increase in salt consumption. ? ?Currently she expresses frustration and confusion as to why her BP is still elevated despite her multiple lifestyle adjustments. After further discussion of possible sleep apnea contributing to this issue, she is interested in having this evaluated. Pt states she does snore often according to her husband, but never though this could be affecting her BP.  ? ?BP Readings from Last 3 Encounters:  ?11/26/21 (!) 140/100  ?09/23/21 126/84  ?09/21/21 138/86  ? ? ?Anxiety/Depression ?[name] is currently compliant with taking prozac 40 mg daily and xanax 0.5 as needed with no adverse effects. Pt expresses that this medication has been beneficial to them. At this time she is regularly following up with Dr. Evelene Croon and is tolerating well. Despite this she does express concern that holding her emotions in could be making affecting her physical health. Upon further discussion she mentioned examples of this such as her siblings passing away and while she was sad, she didn't cry it out. Due to this she is interested in participating in talk therapy. Denies SI/HI.  ? ?Allergies ?Ethie is currently compliant with taking singulair 10 mg daily with no complications. She is managing well and will need a refill of this medication. Denies concerning sx.  ? ?HLD  ?Pt is currently not taking  medication for this issue, but has been adjusting her lifestyle to improve these levels. Calcium score is zero per CT cardiac score from 11/09/21.  ? ?Vitamin D Deficiency  ?She is currently compliant with taking Vitamin D3 1000 UI daily with no complications. She is managing well. Denies concerning sx.  ? ? ?Health Maintenance: ?Immunizations -- Covid- UTD;2022 ?Influenza- UTD;2022 ?Tdap- UTD;2019 ?Colonoscopy -- UTD;2020 ?Mammogram -- VZC;5885 ?PAP -- N/A ?Bone Density -- N/A ?Diet -- Has cut out multiple items such as fried foods, beef/pork, soda and juice ?Sleep habits -- See above ?Exercise -- cardio 5 days x week ?Weight -- Stable ?Mood -- Stable ?Weight history: ?Wt Readings from Last 10 Encounters:  ?11/26/21 242 lb 8 oz (110 kg)  ?09/23/21 247 lb (112 kg)  ?09/21/21 239 lb 3.2 oz (108.5 kg)  ?07/19/21 248 lb (112.5 kg)  ?04/01/21 245 lb (111.1 kg)  ?11/16/20 256 lb (116.1 kg)  ?11/09/20 255 lb 9.6 oz (115.9 kg)  ?08/12/20 267 lb (121.1 kg)  ?04/23/20 268 lb (121.6 kg)  ?12/03/19 267 lb (121.1 kg)  ? ?Body mass index is 39.14 kg/m?[name] ?No LMP recorded. Patient has had a hysterectomy. ?Alcohol use:  reports no history of alcohol use. ?Tobacco use:  ?Tobacco Use: Low Risk   ? Smoking Tobacco Use: Never  ? Smokeless Tobacco Use: Never  ? Passive Exposure: Not on file  ? ? ? ? ?  11/26/2021  ? 11:03 AM  ?Depression screen PHQ 2/9  ?Decreased Interest 0  ?Down, Depressed, Hopeless 1  ?PHQ - 2 Score 1  ?Altered sleeping 1  ?Tired, decreased energy 1  ?  Change in appetite 1  ?Feeling bad or failure about yourself  1  ?Trouble concentrating 1  ?Moving slowly or fidgety/restless 0  ?Suicidal thoughts 0  ?PHQ-9 Score 6  ? ? ? ?Other providers/specialists: ?Patient Care Team: ?Jarold MottoWorley, , PA as PCP - General (Physician Assistant)  ? ? ?PMHx, SurgHx, SocialHx, Medications, and Allergies were reviewed in the Visit Navigator and updated as appropriate.  ? ?Past Medical History:  ?Diagnosis Date  ? Allergy   ? Anxiety   ?  Arthritis   ? Depression   ? Family history of breast cancer   ? Family history of colon cancer   ? Family history of pancreatic cancer   ? GERD (gastroesophageal reflux disease)   ? Hypertension   ? ? ? ?Past Surgical History:  ?Procedure Laterality Date  ? ABDOMINAL HYSTERECTOMY  2004  ? CESAREAN SECTION  2003  ? KNEE ARTHROSCOPY Right 2013  ? ? ? ?Family History  ?Problem Relation Age of Onset  ? Colon cancer Mother [id number]  ? Cancer Mother   ? Diabetes Mother   ? Heart disease Father   ?     CHF  ? Hypertension Father   ? Heart attack Father [id number]  ? Stroke Father   ? Diabetes Sister   ? Breast cancer Sister [id number]  ? Congestive Heart Failure Sister   ? Heart attack Sister   ? Pancreatic cancer Sister [id number]  ? Heart failure Brother   ? Heart disease Brother   ?     stent  ? Colon cancer Maternal Grandfather   ?     dx >50  ? Heart Problems Paternal Uncle   ? ? ?Social History  ? ?Tobacco Use  ? Smoking status: Never  ? Smokeless tobacco: Never  ?Vaping Use  ? Vaping Use: Never used  ?Substance Use Topics  ? Alcohol use: No  ? Drug use: No  ? ? ?Review of Systems:  ? ?Review of Systems  ?Constitutional:  Negative for chills, fever, malaise/fatigue and weight loss.  ?HENT:  Negative for hearing loss, sinus pain and sore throat.   ?Respiratory:  Negative for cough and hemoptysis.   ?Cardiovascular:  Negative for chest pain, palpitations, leg swelling and PND.  ?Gastrointestinal:  Negative for abdominal pain, constipation, diarrhea, heartburn, nausea and vomiting.  ?Genitourinary:  Negative for dysuria, frequency and urgency.  ?Musculoskeletal:  Negative for back pain, myalgias and neck pain.  ?Skin:  Negative for itching and rash.  ?Neurological:  Negative for dizziness, tingling, seizures and headaches.  ?Endo/Heme/Allergies:  Negative for polydipsia.  ?Psychiatric/Behavioral:  Negative for depression. The patient is not nervous/anxious.   ? ?Objective:  ? ?BP (!) 140/100 (BP Location: Left Arm, Cuff Size: Large)   Pulse 68    Temp 98.4 ?F (36.9 ?C) (Temporal)   Resp (!) 74   Ht 5\' 6"  (1.676 m)   Wt 242 lb 8 oz (110 kg)   SpO2 97%   BMI 39.14 kg/m?  ?Body mass index is 39.14 kg/m?. ? ? ?General Appearance:    Alert, cooperative, no distress, appears stated age  ?Head:    Normocephalic, without obvious abnormality, atraumatic  ?Eyes:    PERRL, conjunctiva/corneas clear, EOM's intact, fundi  ?  benign, both eyes  ?Ears:    Normal TM's and external ear canals, both ears  ?Nose:   Nares normal, septum midline, mucosa normal, no drainage    or sinus tenderness  ?Throat:   Lips, mucosa, and tongue normal; teeth and  gums normal  ?Neck:   Supple, symmetrical, trachea midline, no adenopathy;  ?  thyroid:  no enlargement/tenderness/nodules; no carotid ?  bruit or JVD  ?Back:     Symmetric, no curvature, ROM normal, no CVA tenderness  ?Lungs:     Clear to auscultation bilaterally, respirations unlabored  ?Chest Wall:    No tenderness or deformity  ? Heart:    Regular rate and rhythm, S1 and S2 normal, no murmur, rub or gallop  ?Breast Exam:    Deferred  ?Abdomen:     Soft, non-tender, bowel sounds active all four quadrants,  ?  no masses, no organomegaly  ?Genitalia:    Deferred  ?Extremities:   Extremities normal, atraumatic, no cyanosis or edema  ?Pulses:   2+ and symmetric all extremities  ?Skin:   Skin color, texture, turgor normal, no rashes or lesions  ?Lymph nodes:   Cervical, supraclavicular, and axillary nodes normal  ?Neurologic:   CNII-XII intact, normal strength, sensation and reflexes  ?  throughout  ? ? ?Assessment/Plan:  ? ?Routine physical examination ?Today patient counseled on age appropriate routine health concerns for screening and prevention, each reviewed and up to date or declined. Immunizations reviewed and up to date or declined. Labs ordered and reviewed. Risk factors for depression reviewed and negative. Hearing function and visual acuity are intact. ADLs screened and addressed as needed. Functional ability and level  of safety reviewed and appropriate. Education, counseling and referrals performed based on assessed risks today. Patient provided with a copy of personalized plan for preventive services. ? ? ?Hypertension, un

## 2021-11-26 NOTE — Patient Instructions (Addendum)
It was great to see you! ? ?Referral for sleep study (neurology) and talk therapy ? ?I have refilled your singulair ? ?Please go to the lab for blood work.  ? ?Our office will call you with your results unless you have chosen to receive results via MyChart. ? ?If your blood work is normal we will follow-up each year for physicals and as scheduled for chronic medical problems. ? ?If anything is abnormal we will treat accordingly and get you in for a follow-up. ? ?Take care, ? ?Lelon Mast ?  ?

## 2021-11-29 ENCOUNTER — Other Ambulatory Visit: Payer: Self-pay | Admitting: *Deleted

## 2021-11-29 ENCOUNTER — Encounter: Payer: Self-pay | Admitting: Physician Assistant

## 2021-11-29 ENCOUNTER — Other Ambulatory Visit: Payer: Self-pay | Admitting: Physician Assistant

## 2021-11-29 DIAGNOSIS — R710 Precipitous drop in hematocrit: Secondary | ICD-10-CM

## 2021-12-03 NOTE — Progress Notes (Deleted)
?Cardiology Office Note:   ? ?Date:  12/03/2021  ? ?ID:  Aimee [name], DOB Aug 12, 1961, MRN [id number] ? ?PCP:  [name], PA ?  ?[facility] Providers ?Cardiologist:  None {  ? ?Referring MD: [name], PA  ? ? ?History of Present Illness:   ? ?Aimee [name] is a 61 y.o. female with a hx of HTN, GERD, anxiety, and depression who was referred to [name], [place] for further evaluation of chest pain and HTN.  ? ?Ca score in 10/2021 was 0. ? ?Today, *** ? ?Past Medical History:  ?Diagnosis Date  ? Allergy   ? Anxiety   ? Arthritis   ? Depression   ? Family history of breast cancer   ? Family history of colon cancer   ? Family history of pancreatic cancer   ? GERD (gastroesophageal reflux disease)   ? Hypertension   ? ? ?Past Surgical History:  ?Procedure Laterality Date  ? ABDOMINAL HYSTERECTOMY  2004  ? CESAREAN SECTION  2003  ? KNEE ARTHROSCOPY Right 2013  ? ? ?Current Medications: ?No outpatient medications have been marked as taking for the 12/07/21 encounter (Appointment) with Freada Bergeron, MD.  ?  ? ?Allergies:   Patient has no known allergies.  ? ?Social History  ? ?Socioeconomic History  ? Marital status: Married  ?  Spouse name: Not on file  ? Number of children: Not on file  ? Years of education: Not on file  ? Highest education level: Not on file  ?Occupational History  ? Not on file  ?Tobacco Use  ? Smoking status: Never  ? Smokeless tobacco: Never  ?Vaping Use  ? Vaping Use: Never used  ?Substance and Sexual Activity  ? Alcohol use: No  ? Drug use: No  ? Sexual activity: Yes  ?  Birth control/protection: Surgical  ?Other Topics Concern  ? Not on file  ?Social History Narrative  ? Works for [employer] -- working there for 24 years  ? Married, 2 kids (80 and 49) and 1 grandson  ? ?Social Determinants of Health  ? ?Financial Resource Strain: Not on file  ?Food Insecurity: Not on file  ?Transportation Needs: Not on file  ?Physical Activity: Not on file  ?Stress: Not on file   ?Social Connections: Not on file  ?  ? ?Family History: ?The patient's ***family history includes Breast cancer (age of onset: 18) in her sister; Cancer in her mother; Colon cancer in her maternal grandfather; Colon cancer (age of onset: 70) in her mother; Congestive Heart Failure in her sister; Diabetes in her mother and sister; Heart Problems in her paternal uncle; Heart attack in her sister; Heart attack (age of onset: 67) in her father; Heart disease in her brother and father; Heart failure in her brother; Hypertension in her father; Pancreatic cancer (age of onset: 53) in her sister; Stroke in her father. ? ?ROS:   ?Please see the history of present illness.    ?*** All other systems reviewed and are negative. ? ?EKGs/Labs/Other Studies Reviewed:   ? ?The following studies were reviewed today: ?Ca score 11/09/21: ?FINDINGS: ?Coronary arteries: Normal origins. ?  ?Coronary Calcium Score: ?  ?Left main: 0 ?  ?Left anterior descending artery: 0 ?  ?Left circumflex artery: 0 ?  ?Right coronary artery: 0 ?  ?Total: 0 ?  ?Percentile: 0 ?  ?Pericardium: Normal. ?  ?Ascending Aorta: Normal caliber. ?  ?Non-cardiac: See separate report from [facility]. ?  ?IMPRESSION: ?  Coronary calcium score of 0. This was 0 percentile for age-, race-, ?and sex-matched controls. ?  ?RECOMMENDATIONS: ?Coronary artery calcium (CAC) score is a strong predictor of ?incident coronary heart disease (CHD) and provides predictive ?information beyond traditional risk factors. CAC scoring is ?reasonable to use in the decision to withhold, postpone, or initiate ?statin therapy in intermediate-risk or selected borderline-risk ?asymptomatic adults (age 30-75 years and LDL-C >=70 to <190 mg/dL) ?who do not have diabetes or established atherosclerotic ?cardiovascular disease (ASCVD).* In intermediate-risk (10-year ASCVD ?risk >=7.5% to <20%) adults or selected borderline-risk (10-year ?ASCVD risk >=5% to <7.5%) adults in whom a CAC score is  measured for ?the purpose of making a treatment decision the following ?recommendations have been made: ?  ?If CAC=0, it is reasonable to withhold statin therapy and reassess ?in 5 to 10 years, as long as higher risk conditions are absent ?(diabetes mellitus, family history of premature CHD in first degree ?relatives (males <55 years; females <65 years), cigarette smoking, ?or LDL >=190 mg/dL). ?  ?If CAC is 1 to 99, it is reasonable to initiate statin therapy for ?patients >=31 years of age. ?  ?If CAC is >=100 or >=75th percentile, it is reasonable to initiate ?statin therapy at any age. ?  ?Cardiology referral should be considered for patients with CAC ?scores >=400 or >=75th percentile. ?  ?*2018 AHA/ACC/AACVPR/AAPA/ABC/ACPM/ADA/AGS/APhA/ASPC/NLA/PCNA ?Guideline on the Management of Blood Cholesterol: A Report of the ?[employer] Task Force ?on Clinical Practice Guidelines. J Am Coll Cardiol. ?2019;73(24):3168-3209. ?  ?TTE 10/26/21: ?IMPRESSIONS  ? 1. Left ventricular ejection fraction, by estimation, is 60 to 65%. The  ?left ventricle has normal function. The left ventricle has no regional  ?wall motion abnormalities. Left ventricular diastolic parameters are  ?consistent with Grade I diastolic  ?dysfunction (impaired relaxation).  ? 2. Right ventricular systolic function is normal. The right ventricular  ?size is normal. There is normal pulmonary artery systolic pressure.  ? 3. Left atrial size was moderately dilated.  ? 4. The mitral valve is normal in structure. Trivial mitral valve  ?regurgitation. No evidence of mitral stenosis.  ? 5. The aortic valve is tricuspid. Aortic valve regurgitation is not  ?visualized. No aortic stenosis is present.  ? 6. The inferior vena cava is normal in size with greater than 50%  ?respiratory variability, suggesting right atrial pressure of 3 mmHg.  ? ?EKG:  EKG is *** ordered today.  The ekg ordered today demonstrates  *** ? ?Recent Labs: ?11/26/2021: ALT 14; BUN 14; Creatinine, Ser 0.72; Hemoglobin 11.7; Platelets 264.0; Potassium 3.7; Sodium 138  ?Recent Lipid Panel ?   ?Component Value Date/Time  ? CHOL 158 11/26/2021 1133  ? TRIG 52.0 11/26/2021 1133  ? HDL 51.20 11/26/2021 1133  ? CHOLHDL 3 11/26/2021 1133  ? VLDL 10.4 11/26/2021 1133  ? [place] 96 11/26/2021 1133  ? ? ? ?Risk Assessment/Calculations:   ?{Does this patient have ATRIAL FIBRILLATION?[phone]} ? ?    ? ?Physical Exam:   ? ?VS:  There were no vitals taken for this visit.   ? ?Wt Readings from Last 3 Encounters:  ?11/26/21 242 lb 8 oz (110 kg)  ?09/23/21 247 lb (112 kg)  ?09/21/21 239 lb 3.2 oz (108.5 kg)  ?  ? ?GEN: *** Well nourished, well developed in no acute distress ?HEENT: Normal ?NECK: No JVD; No carotid bruits ?LYMPHATICS: No lymphadenopathy ?CARDIAC: ***RRR, no murmurs, rubs, gallops ?RESPIRATORY:  Clear to auscultation without rales, wheezing or rhonchi  ?ABDOMEN:  Soft, non-tender, non-distended ?MUSCULOSKELETAL:  No edema; No deformity  ?SKIN: Warm and dry ?NEUROLOGIC:  Alert and oriented x 3 ?PSYCHIATRIC:  Normal affect  ? ?ASSESSMENT:   ? ?No diagnosis found. ?PLAN:   ? ?In order of problems listed above: ? ?#Chest Pain: ?Ca score 0. Likely not cardiac ? ?#HTN: ?-Continue amlodipine 10mg  daily ? ?   ? ?{Are you ordering a CV Procedure (e.g. stress test, cath, DCCV, TEE, etc)?   Press F2        :[id number]  ? ? ?Medication Adjustments/Labs and Tests Ordered: ?Current medicines are reviewed at length with the patient today.  Concerns regarding medicines are outlined above.  ?No orders of the defined types were placed in this encounter. ? ?No orders of the defined types were placed in this encounter. ? ? ?There are no Patient Instructions on file for this visit.  ? ?Signed, ?Freada Bergeron, MD  ?12/03/2021 3:04 PM    ?[place] ?

## 2021-12-07 ENCOUNTER — Encounter: Payer: Self-pay | Admitting: Cardiology

## 2021-12-07 ENCOUNTER — Ambulatory Visit: Payer: 59 | Admitting: Cardiology

## 2021-12-07 VITALS — BP 144/96 | HR 65 | Ht 66.0 in | Wt 247.6 lb

## 2021-12-07 DIAGNOSIS — I10 Essential (primary) hypertension: Secondary | ICD-10-CM | POA: Diagnosis not present

## 2021-12-07 DIAGNOSIS — Z8249 Family history of ischemic heart disease and other diseases of the circulatory system: Secondary | ICD-10-CM | POA: Diagnosis not present

## 2021-12-07 DIAGNOSIS — Z79899 Other long term (current) drug therapy: Secondary | ICD-10-CM | POA: Diagnosis not present

## 2021-12-07 DIAGNOSIS — E782 Mixed hyperlipidemia: Secondary | ICD-10-CM | POA: Diagnosis not present

## 2021-12-07 DIAGNOSIS — E669 Obesity, unspecified: Secondary | ICD-10-CM

## 2021-12-07 MED ORDER — HYDROCHLOROTHIAZIDE 25 MG PO TABS
25.0000 mg | ORAL_TABLET | Freq: Every day | ORAL | 3 refills | Status: DC
Start: 2021-12-07 — End: 2022-06-07

## 2021-12-07 NOTE — Patient Instructions (Signed)
Medication Instructions:  ? ?START TAKING HYDROCHLOROTHIAZIDE 25 MG BY MOUTH DAILY ? ?*If you need a refill on your cardiac medications before your next appointment, please call your pharmacy* ? ? ?Lab Work: ? ?IN ONE WEEK HERE IN THE OFFICE--BMET ? ?If you have labs (blood work) drawn today and your tests are completely normal, you will receive your results only by: ?MyChart Message (if you have MyChart) OR ?A paper copy in the mail ?If you have any lab test that is abnormal or we need to change your treatment, we will call you to review the results. ? ? ?Follow-Up: ?At [facility], you and your health needs are our priority.  As part of our continuing mission to provide you with exceptional heart care, we have created designated Provider Care Teams.  These Care Teams include your primary Cardiologist (physician) and Advanced Practice Providers (APPs -  Physician Assistants and Nurse Practitioners) who all work together to provide you with the care you need, when you need it. ? ?We recommend signing up for the patient portal called "MyChart".  Sign up information is provided on this After Visit Summary.  MyChart is used to connect with patients for Virtual Visits (Telemedicine).  Patients are able to view lab/test results, encounter notes, upcoming appointments, etc.  Non-urgent messages can be sent to your provider as well.   ?To learn more about what you can do with MyChart, go to [email or web address].   ? ?Your next appointment:   ?6 month(s) ? ?The format for your next appointment:   ?In Person ? ?Provider:   ?DR. PEMBERTON ? ?Important Information About Sugar ? ? ? ? ? ? ?

## 2021-12-07 NOTE — Progress Notes (Signed)
?Cardiology Office Note:   ? ?Date:  12/07/2021  ? ?ID:  Aimee [name], DOB [birth year], MRN [id number] ? ?PCP:  Aimee [name], Samantha, PA ?  ?[facility] Providers ?Cardiologist:  None {  ? ?Referring MD: Aimee [name], Samantha, PA  ? ? ?History of Present Illness:   ? ?Aimee LundSharon R Mormino is a 61 y.o. female with a hx of HTN, GERD, anxiety, and depression who was referred to Aimee MottoSamantha Worley, [place] for further evaluation of HTN. Ca score in 10/2021 was 0. ? ?Today, she is doing well. Since her blood pressure has been high, she reports a headaches localized to her L lateral head. Over the past month, she completely changed her dietary habits. She stopped having soda, sweets, beef, and pork. She drinks only water throughout the day. She exercises at the gym 6 days a week. For 2 days, she goes to the pool. On other days, she walks and uses the machines.  ? ?She endorses a family history of cardiovascular disease. Her sister passed away due to pancreatic cancer and heart complications. Her brother also died of cardiovascular disease. Her father and another sister have cardiovascular disease and her other brother has stents. She denies chest pain, chest pressure, dyspnea at rest or with exertion, palpitations, PND, orthopnea, or leg swelling.  ? ?Past Medical History:  ?Diagnosis Date  ? Allergy   ? Anxiety   ? Arthritis   ? Depression   ? Family history of breast cancer   ? Family history of colon cancer   ? Family history of pancreatic cancer   ? GERD (gastroesophageal reflux disease)   ? Hypertension   ? ? ?Past Surgical History:  ?Procedure Laterality Date  ? ABDOMINAL HYSTERECTOMY  2004  ? CESAREAN SECTION  2003  ? KNEE ARTHROSCOPY Right 2013  ? ? ?Current Medications: ?Current Meds  ?Medication Sig  ? ALPRAZolam (XANAX) 0.5 MG tablet Take 0.5 mg by mouth 4 (four) times daily as needed.   ? amLODipine (NORVASC) 10 MG tablet TAKE 1 TABLET(10 MG) BY MOUTH DAILY  ? Cholecalciferol (VITAMIN D3) 1000 units CAPS Take 1 capsule by mouth  daily.  ? FLUoxetine (PROZAC) 40 MG capsule fluoxetine 40 mg capsule ? TAKE 1 CAPSULE BY MOUTH EVERY DAY  ? hydrochlorothiazide (HYDRODIURIL) 25 MG tablet Take 1 tablet (25 mg total) by mouth daily.  ? ibuprofen (ADVIL) 200 MG tablet Take 400 mg by mouth as needed.  ? montelukast (SINGULAIR) 10 MG tablet TAKE 1 TABLET(10 MG) BY MOUTH AT BEDTIME  ? Multiple Vitamin (MULTIVITAMIN) tablet Take 1 tablet by mouth daily.  ? triamcinolone (KENALOG) 0.1 % SMARTSIG:Sparingly Topical Twice Daily  ?  ? ?Allergies:   Patient has no known allergies.  ? ?Social History  ? ?Socioeconomic History  ? Marital status: Married  ?  Spouse name: Not on file  ? Number of children: Not on file  ? Years of education: Not on file  ? Highest education level: Not on file  ?Occupational History  ? Not on file  ?Tobacco Use  ? Smoking status: Never  ? Smokeless tobacco: Never  ?Vaping Use  ? Vaping Use: Never used  ?Substance and Sexual Activity  ? Alcohol use: No  ? Drug use: No  ? Sexual activity: Yes  ?  Birth control/protection: Surgical  ?Other Topics Concern  ? Not on file  ?Social History Narrative  ? Works for [employer] -- working there for 24 years  ? Married, 2 kids (34 and 15) and  1 grandson  ? ?Social Determinants of Health  ? ?Financial Resource Strain: Not on file  ?Food Insecurity: Not on file  ?Transportation Needs: Not on file  ?Physical Activity: Not on file  ?Stress: Not on file  ?Social Connections: Not on file  ?  ? ?Family History: ?The patient's family history includes Breast cancer (age of onset: 33) in her sister; Cancer in her mother; Colon cancer in her maternal grandfather; Colon cancer (age of onset: 60) in her mother; Congestive Heart Failure in her sister; Diabetes in her mother and sister; Heart Problems in her paternal uncle; Heart attack in her sister; Heart attack (age of onset: 47) in her father; Heart disease in her brother and father; Heart failure in her brother; Hypertension in her father;  Pancreatic cancer (age of onset: 4) in her sister; Stroke in her father. ? ?ROS:   ?Please see the history of present illness.    ?Review of Systems  ?Constitutional:  Negative for malaise/fatigue and weight loss.  ?HENT:  Negative for congestion and sore throat.   ?Eyes:  Negative for blurred vision.  ?Respiratory:  Negative for cough and shortness of breath.   ?Cardiovascular:  Negative for chest pain, palpitations, orthopnea, claudication, leg swelling and PND.  ?Gastrointestinal:  Negative for heartburn and nausea.  ?Genitourinary:  Negative for dysuria and urgency.  ?Musculoskeletal:  Negative for joint pain and myalgias.  ?Skin:  Negative for itching and rash.  ?Neurological:  Positive for headaches (L lateral head). Negative for dizziness.  ?Endo/Heme/Allergies:  Does not bruise/bleed easily.  ?Psychiatric/Behavioral:  The patient is not nervous/anxious and does not have insomnia.   ?All other systems reviewed and are negative. ? ?EKGs/Labs/Other Studies Reviewed:   ? ?The following studies were reviewed today: ?Ca score 11/09/21: ?FINDINGS: ?Coronary arteries: Normal origins. ?  ?Coronary Calcium Score: ?  ?Left main: 0 ?  ?Left anterior descending artery: 0 ?  ?Left circumflex artery: 0 ?  ?Right coronary artery: 0 ?  ?Total: 0 ?  ?Percentile: 0 ?  ?Pericardium: Normal. ?  ?Ascending Aorta: Normal caliber. ?  ?Non-cardiac: See separate report from [facility]. ?  ?IMPRESSION: ?Coronary calcium score of 0. This was 0 percentile for age-, race-, ?and sex-matched controls. ?  ?RECOMMENDATIONS: ?Coronary artery calcium (CAC) score is a strong predictor of ?incident coronary heart disease (CHD) and provides predictive ?information beyond traditional risk factors. CAC scoring is ?reasonable to use in the decision to withhold, postpone, or initiate ?statin therapy in intermediate-risk or selected borderline-risk ?asymptomatic adults (age 43-75 years and LDL-C >=70 to <190 mg/dL) ?who do not have diabetes  or established atherosclerotic ?cardiovascular disease (ASCVD).* In intermediate-risk (10-year ASCVD ?risk >=7.5% to <20%) adults or selected borderline-risk (10-year ?ASCVD risk >=5% to <7.5%) adults in whom a CAC score is measured for ?the purpose of making a treatment decision the following ?recommendations have been made: ?  ?If CAC=0, it is reasonable to withhold statin therapy and reassess ?in 5 to 10 years, as long as higher risk conditions are absent ?(diabetes mellitus, family history of premature CHD in first degree ?relatives (males <55 years; females <65 years), cigarette smoking, ?or LDL >=190 mg/dL). ?  ?If CAC is 1 to 99, it is reasonable to initiate statin therapy for ?patients >=52 years of age. ?  ?If CAC is >=100 or >=75th percentile, it is reasonable to initiate ?statin therapy at any age. ?  ?Cardiology referral should be considered for patients with CAC ?scores >=400 or >=75th percentile. ?  ?*  2018 AHA/ACC/AACVPR/AAPA/ABC/ACPM/ADA/AGS/APhA/ASPC/NLA/PCNA ?Guideline on the Management of Blood Cholesterol: A Report of the ?[employer] Task Force ?on Clinical Practice Guidelines. J Am Coll Cardiol. ?2019;73(24):3168-3209. ?  ?TTE 10/26/21: ?IMPRESSIONS  ? 1. Left ventricular ejection fraction, by estimation, is 60 to 65%. The  ?left ventricle has normal function. The left ventricle has no regional  ?wall motion abnormalities. Left ventricular diastolic parameters are  ?consistent with Grade I diastolic  ?dysfunction (impaired relaxation).  ? 2. Right ventricular systolic function is normal. The right ventricular  ?size is normal. There is normal pulmonary artery systolic pressure.  ? 3. Left atrial size was moderately dilated.  ? 4. The mitral valve is normal in structure. Trivial mitral valve  ?regurgitation. No evidence of mitral stenosis.  ? 5. The aortic valve is tricuspid. Aortic valve regurgitation is not  ?visualized. No aortic stenosis is  present.  ? 6. The inferior vena cava is normal in size with greater than 50%  ?respiratory variability, suggesting right atrial pressure of 3 mmHg.  ? ?EKG: EKG was not ordered today  ? ?Recent Labs: ?11/26/2021: ALT

## 2021-12-08 NOTE — Progress Notes (Signed)
?Charlann Boxer D.O. ?[facility] ?[place] ?Phone: [phone] ?Subjective:   ?I, Vilma Meckel, am serving as a Education administrator for Dr. Hulan Saas. ?This visit occurred during the SARS-CoV-2 public health emergency.  Safety protocols were in place, including screening questions prior to the visit, additional usage of staff PPE, and extensive cleaning of exam room while observing appropriate contact time as indicated for disinfecting solutions.  ? ?I'm seeing this patient by the request  of:  [name], [place] ? ?CC: bilateral knee pain  ? ?[id number]  ?09/23/2021 ?We did injection given today.  Tolerated the procedure well.  Increase activity slowly.  Discussed which exercises to do which ones to avoid.  Patient was encouraged to continue to lose weight for this chronic problem with exacerbation.  Follow-up again in 10 weeks ? ?Updated 12/09/2021 ?Aimee [name] is a 61 y.o. female coming in with complaint of knee pain a lot of popping and stiffness but that is it overall can do most daily activities, working with aquatic therapy and feels like it is improving, continues to work on weight loss. ? ? ? ?  ? ?Past Medical History:  ?Diagnosis Date  ? Allergy   ? Anxiety   ? Arthritis   ? Depression   ? Family history of breast cancer   ? Family history of colon cancer   ? Family history of pancreatic cancer   ? GERD (gastroesophageal reflux disease)   ? Hypertension   ? ?Past Surgical History:  ?Procedure Laterality Date  ? ABDOMINAL HYSTERECTOMY  2004  ? CESAREAN SECTION  2003  ? KNEE ARTHROSCOPY Right 2013  ? ?Social History  ? ?Socioeconomic History  ? Marital status: Married  ?  Spouse name: Not on file  ? Number of children: Not on file  ? Years of education: Not on file  ? Highest education level: Not on file  ?Occupational History  ? Not on file  ?Tobacco Use  ? Smoking status: Never  ? Smokeless tobacco: Never  ?Vaping Use  ? Vaping Use: Never used  ?Substance and Sexual  Activity  ? Alcohol use: No  ? Drug use: No  ? Sexual activity: Yes  ?  Birth control/protection: Surgical  ?Other Topics Concern  ? Not on file  ?Social History Narrative  ? Works for [employer] -- working there for 24 years  ? Married, 2 kids (58 and 66) and 1 grandson  ? ?Social Determinants of Health  ? ?Financial Resource Strain: Not on file  ?Food Insecurity: Not on file  ?Transportation Needs: Not on file  ?Physical Activity: Not on file  ?Stress: Not on file  ?Social Connections: Not on file  ? ?No Known Allergies ?Family History  ?Problem Relation Age of Onset  ? Colon cancer Mother 23  ? Cancer Mother   ? Diabetes Mother   ? Heart disease Father   ?     CHF  ? Hypertension Father   ? Heart attack Father 20  ? Stroke Father   ? Diabetes Sister   ? Breast cancer Sister 78  ? Congestive Heart Failure Sister   ? Heart attack Sister   ? Pancreatic cancer Sister 28  ? Heart failure Brother   ? Heart disease Brother   ?     stent  ? Colon cancer Maternal Grandfather   ?     dx >50  ? Heart Problems Paternal Uncle   ? ? ? ?Current Outpatient  Medications (Cardiovascular):  ?  amLODipine (NORVASC) 10 MG tablet, TAKE 1 TABLET(10 MG) BY MOUTH DAILY ?  hydrochlorothiazide (HYDRODIURIL) 25 MG tablet, Take 1 tablet (25 mg total) by mouth daily. ? ?Current Outpatient Medications (Respiratory):  ?  montelukast (SINGULAIR) 10 MG tablet, TAKE 1 TABLET(10 MG) BY MOUTH AT BEDTIME ? ?Current Outpatient Medications (Analgesics):  ?  ibuprofen (ADVIL) 200 MG tablet, Take 400 mg by mouth as needed. ? ? ?Current Outpatient Medications (Other):  ?  ALPRAZolam (XANAX) 0.5 MG tablet, Take 0.5 mg by mouth 4 (four) times daily as needed.  ?  Cholecalciferol (VITAMIN D3) 1000 units CAPS, Take 1 capsule by mouth daily. ?  FLUoxetine (PROZAC) 40 MG capsule, fluoxetine 40 mg capsule  TAKE 1 CAPSULE BY MOUTH EVERY DAY ?  Multiple Vitamin (MULTIVITAMIN) tablet, Take 1 tablet by mouth daily. ?  triamcinolone (KENALOG) 0.1 %,  SMARTSIG:Sparingly Topical Twice Daily ? ? ? ?Review of Systems: ? No headache, visual changes, nausea, vomiting, diarrhea, constipation, dizziness, abdominal pain, skin rash, fevers, chills, night sweats, weight loss, swollen lymph nodes, body aches,  chest pain, shortness of breath, mood changes. POSITIVE muscle aches, joint swelling ? ?Objective  ?Blood pressure 138/80, pulse 73, height 5\' 6"  (1.676 m), weight 247 lb (112 kg), SpO2 98 %. ?  ?General: No apparent distress alert and oriented x3 mood and affect normal, dressed appropriately.  ?HEENT: Pupils equal, extraocular movements intact  ?Respiratory: Patient's speak in full sentences and does not appear short of breath  ?Cardiovascular: No lower extremity edema, non tender, no erythema  ?Gait normal with good balance and coordination.  ?MSK:  arthritic changes noted.  Patient is minimally tender to palpation over the patellofemoral joint and the medial joint.  Patient now has good range of motion.  Still has trace effusion noted of the knees bilaterally ? ?  ?Impression and Recommendations:  ?  ? ?The above documentation has been reviewed and is accurate and complete Lyndal Pulley, DO ? ? ? ?

## 2021-12-09 ENCOUNTER — Ambulatory Visit: Payer: 59 | Admitting: Family Medicine

## 2021-12-09 DIAGNOSIS — M170 Bilateral primary osteoarthritis of knee: Secondary | ICD-10-CM | POA: Diagnosis not present

## 2021-12-09 NOTE — Patient Instructions (Signed)
So glad you are doing great  ?Be so proud of the progress you are making ?See me in in 6-8 weeks or sooner if you no what hits the fan ? ?

## 2021-12-09 NOTE — Assessment & Plan Note (Signed)
Patient continues to make some progress at this moment.  We discussed icing regimen and home exercises.  Discussed which activities to do or shortness of avoid.  Patient is doing much better and we will hold on any other injection at this moment but will follow-up with me again in 3 months ?

## 2021-12-14 ENCOUNTER — Other Ambulatory Visit: Payer: 59 | Admitting: *Deleted

## 2021-12-14 DIAGNOSIS — Z79899 Other long term (current) drug therapy: Secondary | ICD-10-CM

## 2021-12-14 DIAGNOSIS — I10 Essential (primary) hypertension: Secondary | ICD-10-CM

## 2021-12-15 LAB — BASIC METABOLIC PANEL
BUN/Creatinine Ratio: 22 (ref 9–23)
BUN: 18 mg/dL (ref 6–24)
CO2: 23 mmol/L (ref 20–29)
Calcium: 9.1 mg/dL (ref 8.7–10.2)
Chloride: 100 mmol/L (ref 96–106)
Creatinine, Ser: 0.81 mg/dL (ref 0.57–1.00)
Glucose: 89 mg/dL (ref 70–99)
Potassium: 3.6 mmol/L (ref 3.5–5.2)
Sodium: 141 mmol/L (ref 134–144)
eGFR: 84 mL/min/{1.73_m2} (ref >59)

## 2021-12-24 ENCOUNTER — Other Ambulatory Visit: Payer: Self-pay | Admitting: Physician Assistant

## 2022-01-26 ENCOUNTER — Ambulatory Visit: Payer: 59 | Admitting: Family Medicine

## 2022-03-01 NOTE — Progress Notes (Deleted)
[name] [address] [place] [address] Phone: [phone] Subjective:    I'm seeing this patient by the request  of:  [name], [place]  CC:   [id number]  12/09/2021 Patient continues to make some progress at this moment.  We discussed icing regimen and home exercises.  Discussed which activities to do or shortness of avoid.  Patient is doing much better and we will hold on any other injection at this moment but will follow-up with me again in 3 months  Updated 03/02/2022 Aimee [name] is a 61 y.o. female coming in with complaint of bilateral knee pain        Past Medical History:  Diagnosis Date   Allergy    Anxiety    Arthritis    Depression    Family history of breast cancer    Family history of colon cancer    Family history of pancreatic cancer    GERD (gastroesophageal reflux disease)    Hypertension    Past Surgical History:  Procedure Laterality Date   ABDOMINAL HYSTERECTOMY  2004   CESAREAN SECTION  2003   KNEE ARTHROSCOPY Right 2013   Social History   Socioeconomic History   Marital status: Married    Spouse name: Not on file   Number of children: Not on file   Years of education: Not on file   Highest education level: Not on file  Occupational History   Not on file  Tobacco Use   Smoking status: Never   Smokeless tobacco: Never  Vaping Use   Vaping Use: Never used  Substance and Sexual Activity   Alcohol use: No   Drug use: No   Sexual activity: Yes    Birth control/protection: Surgical  Other Topics Concern   Not on file  Social History Narrative   Works for the [employer] -- working there for 24 years   Married, 2 kids (34 and 15) and 1 grandson   Social Determinants of Corporate investment banker Strain: Not on file  Food Insecurity: Not on file  Transportation Needs: Not on file  Physical Activity: Not on file  Stress: Not on file  Social Connections: Not on file   No Known  Allergies Family History  Problem Relation Age of Onset   Colon cancer Mother 44   Cancer Mother    Diabetes Mother    Heart disease Father        CHF   Hypertension Father    Heart attack Father 77   Stroke Father    Diabetes Sister    Breast cancer Sister 56   Congestive Heart Failure Sister    Heart attack Sister    Pancreatic cancer Sister 57   Heart failure Brother    Heart disease Brother        stent   Colon cancer Maternal Grandfather        dx >50   Heart Problems Paternal Uncle      Current Outpatient Medications (Cardiovascular):    amLODipine (NORVASC) 10 MG tablet, TAKE 1 TABLET(10 MG) BY MOUTH DAILY   hydrochlorothiazide (HYDRODIURIL) 25 MG tablet, Take 1 tablet (25 mg total) by mouth daily.  Current Outpatient Medications (Respiratory):    montelukast (SINGULAIR) 10 MG tablet, TAKE 1 TABLET(10 MG) BY MOUTH AT BEDTIME  Current Outpatient Medications (Analgesics):    ibuprofen (ADVIL) 200 MG tablet, Take 400 mg by mouth as needed.   Current Outpatient Medications (Other):  ALPRAZolam (XANAX) 0.5 MG tablet, Take 0.5 mg by mouth 4 (four) times daily as needed.    Cholecalciferol (VITAMIN D3) 1000 units CAPS, Take 1 capsule by mouth daily.   FLUoxetine (PROZAC) 40 MG capsule, fluoxetine 40 mg capsule  TAKE 1 CAPSULE BY MOUTH EVERY DAY   Multiple Vitamin (MULTIVITAMIN) tablet, Take 1 tablet by mouth daily.   triamcinolone (KENALOG) 0.1 %, SMARTSIG:Sparingly Topical Twice Daily   Reviewed prior external information including notes and imaging from  primary care provider As well as notes that were available from care everywhere and other healthcare systems.  Past medical history, social, surgical and family history all reviewed in electronic medical record.  No pertanent information unless stated regarding to the chief complaint.   Review of Systems:  No headache, visual changes, nausea, vomiting, diarrhea, constipation, dizziness, abdominal pain, skin  rash, fevers, chills, night sweats, weight loss, swollen lymph nodes, body aches, joint swelling, chest pain, shortness of breath, mood changes. POSITIVE muscle aches  Objective  There were no vitals taken for this visit.   General: No apparent distress alert and oriented x3 mood and affect normal, dressed appropriately.  HEENT: Pupils equal, extraocular movements intact  Respiratory: Patient's speak in full sentences and does not appear short of breath  Cardiovascular: No lower extremity edema, non tender, no erythema      Impression and Recommendations:      The above documentation has been reviewed and is accurate and complete Judi Saa, DO

## 2022-03-02 ENCOUNTER — Ambulatory Visit: Payer: 59 | Admitting: Family Medicine

## 2022-03-23 ENCOUNTER — Encounter (INDEPENDENT_AMBULATORY_CARE_PROVIDER_SITE_OTHER): Payer: Self-pay

## 2022-03-23 IMAGING — US US BREAST*L* LIMITED INC AXILLA
1 series · 5 of 5 positions shown · non-contrast
Comparison: Previous exam(s).

CLINICAL DATA: Patient feels a mass in the LEFT breast. Initially
the mass was the size of a baseball and quite tender. Now the mass
is the size of a dime and significantly less tender.

EXAM:
DIGITAL DIAGNOSTIC UNILATERAL LEFT MAMMOGRAM WITH TOMOSYNTHESIS AND
CAD; ULTRASOUND LEFT BREAST LIMITED
TECHNIQUE: Left digital diagnostic mammography and breast tomosynthesis was
performed. The images were evaluated with computer-aided detection.;
Targeted ultrasound examination of the left breast was performed.

[Series 1: us breast*left* limited inc axilla · 0.06mm/px · 5 acquisitions, 5 frames shown]
[im 1/5]
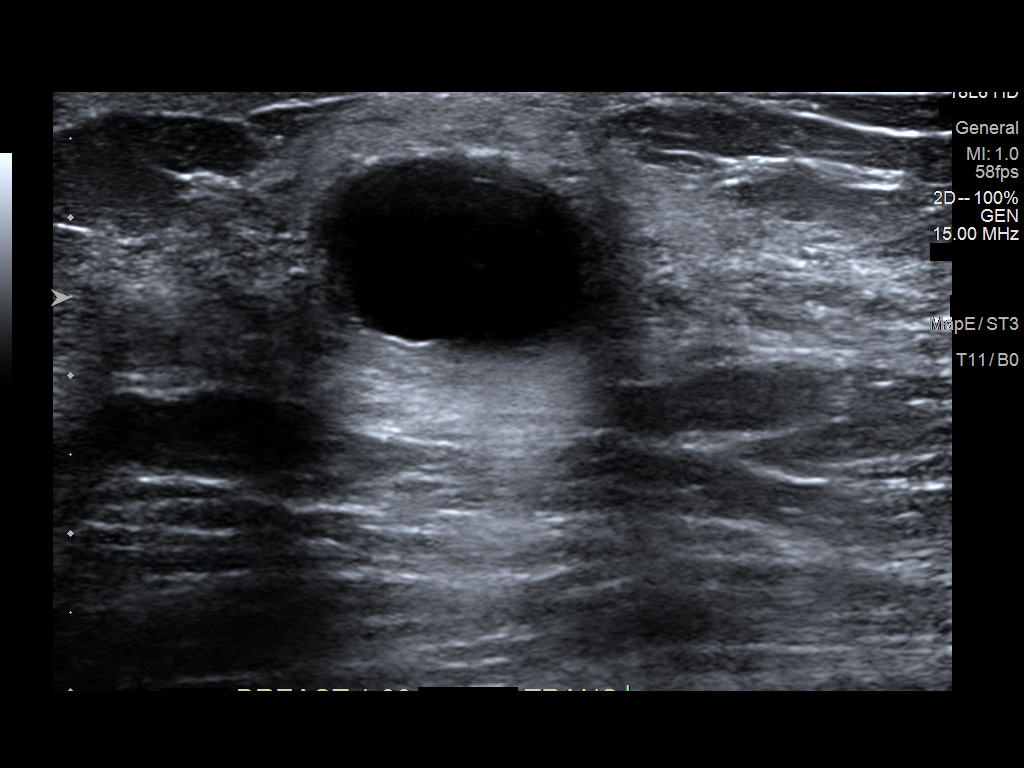
[im 2/5]
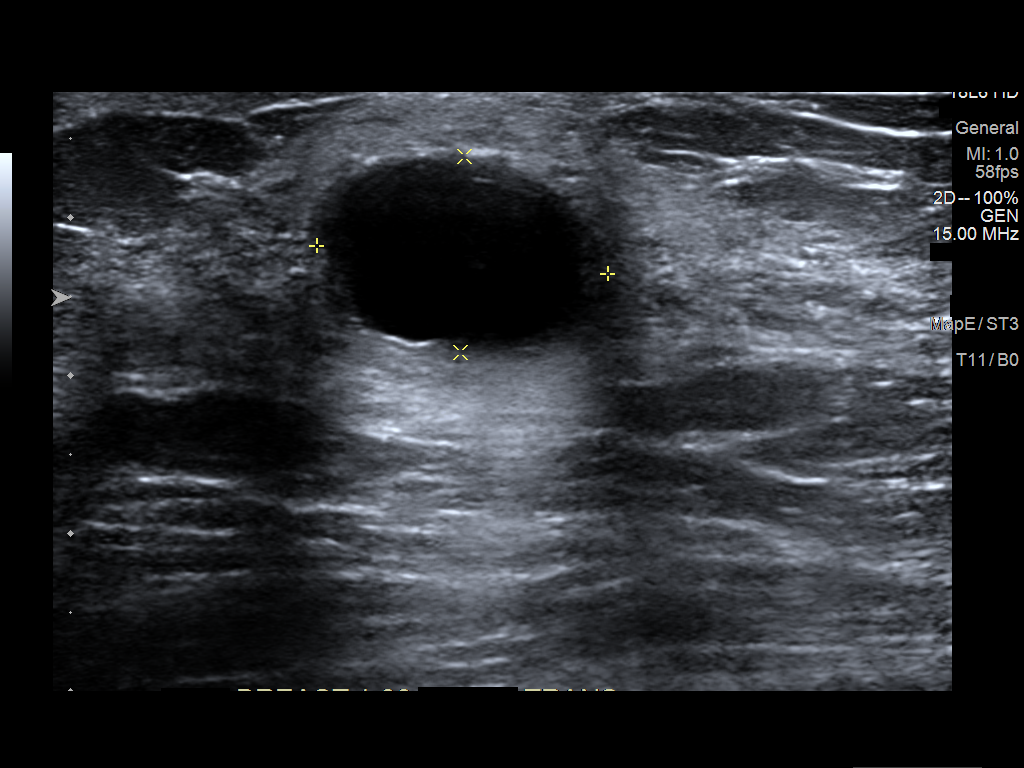
[im 3/5]
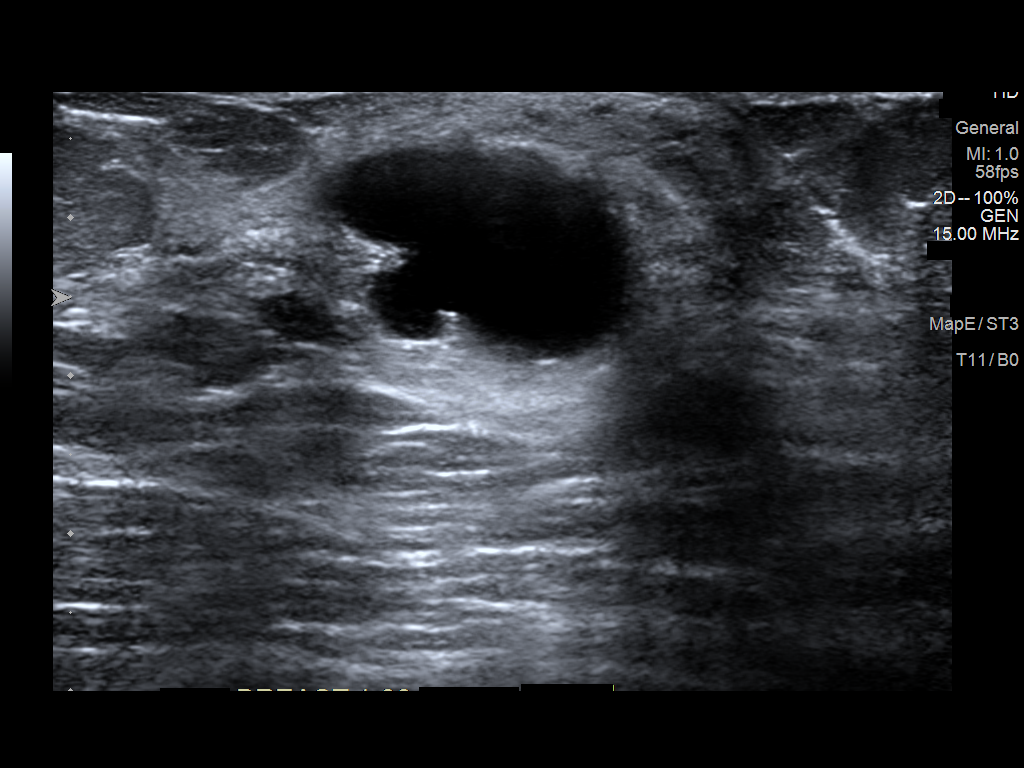
[im 4/5]
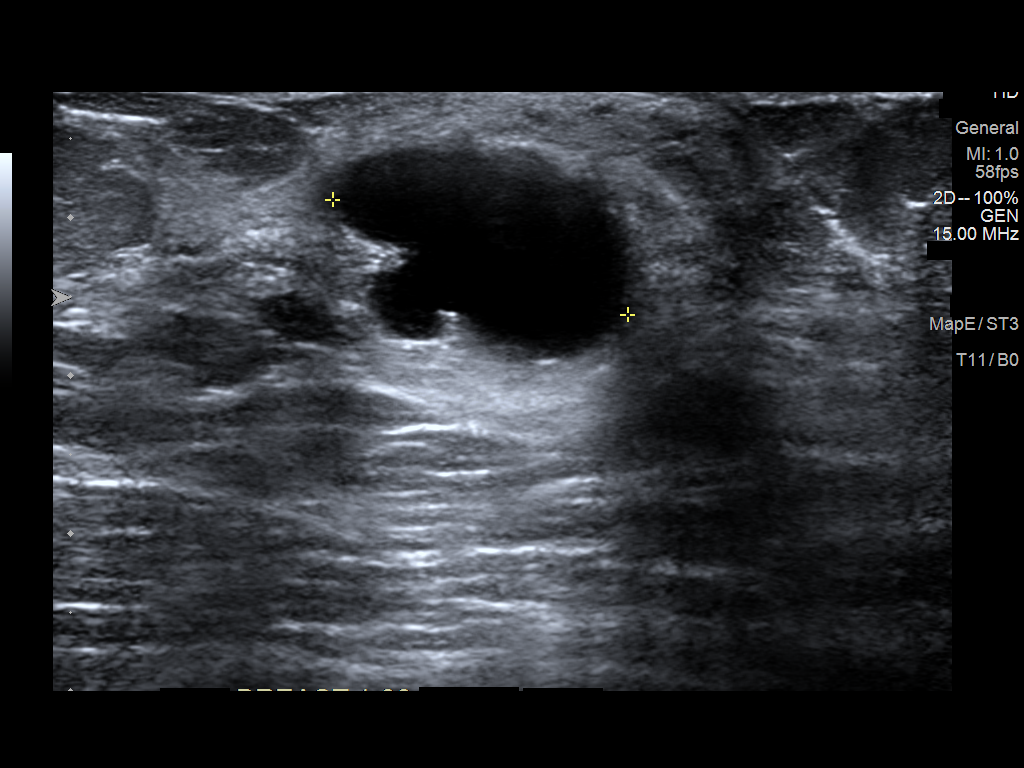
[im 5/5]
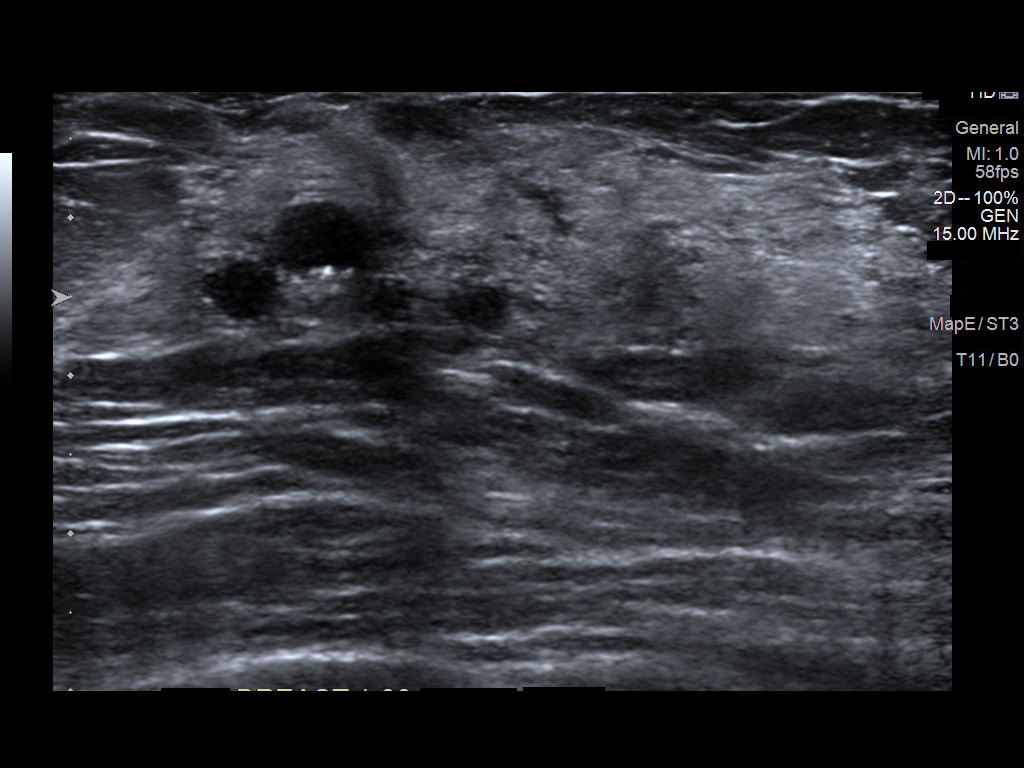

[5 of 5 positions shown; findings below may reference images not displayed]

ACR Breast Density Category c: The breast tissue is heterogeneously
dense, which may obscure small masses.
FINDINGS: In the retroareolar region of the LEFT breast there is a partially
obscured mass marked as palpable with BB. No architectural
distortion or suspicious microcalcifications.

On physical exam, I palpate a firm mass in the retroareolar region
of the LEFT breast. There is no associated erythema or significant
tenderness on exam.

Targeted ultrasound is performed, showing a benign cyst in the 1
o'clock retroareolar region of the LEFT breast measuring 1.9 x 1.2 x
2.0 centimeters. Other sub centimeters cysts are identified in the
retroareolar region. No solid mass or areas of acoustic shadowing.
IMPRESSION: Benign simple cysts in the LEFT breast. No mammographic or
ultrasound evidence for malignancy.

RECOMMENDATION:
Screening is recommended in June 2021.

I have discussed the findings and recommendations with the patient.
If applicable, a reminder letter will be sent to the patient
regarding the next appointment.

BI-RADS CATEGORY  2: Benign.

## 2022-03-23 IMAGING — MG MM DIGITAL DIAGNOSTIC UNILAT*L* W/ TOMO W/ CAD
8 series · 8 of 24 positions shown · non-contrast
Comparison: Previous exam(s).

CLINICAL DATA: Patient feels a mass in the LEFT breast. Initially
the mass was the size of a baseball and quite tender. Now the mass
is the size of a dime and significantly less tender.

EXAM:
DIGITAL DIAGNOSTIC UNILATERAL LEFT MAMMOGRAM WITH TOMOSYNTHESIS AND
CAD; ULTRASOUND LEFT BREAST LIMITED
TECHNIQUE: Left digital diagnostic mammography and breast tomosynthesis was
performed. The images were evaluated with computer-aided detection.;
Targeted ultrasound examination of the left breast was performed.

[L CC synth-2D]
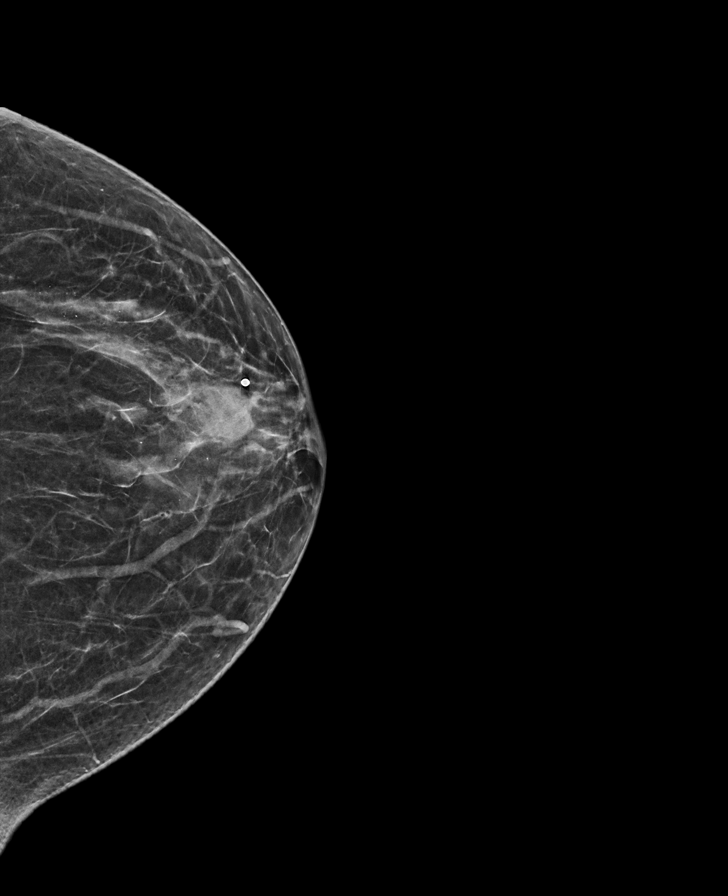

[L MLO synth-2D (1 of 2)]
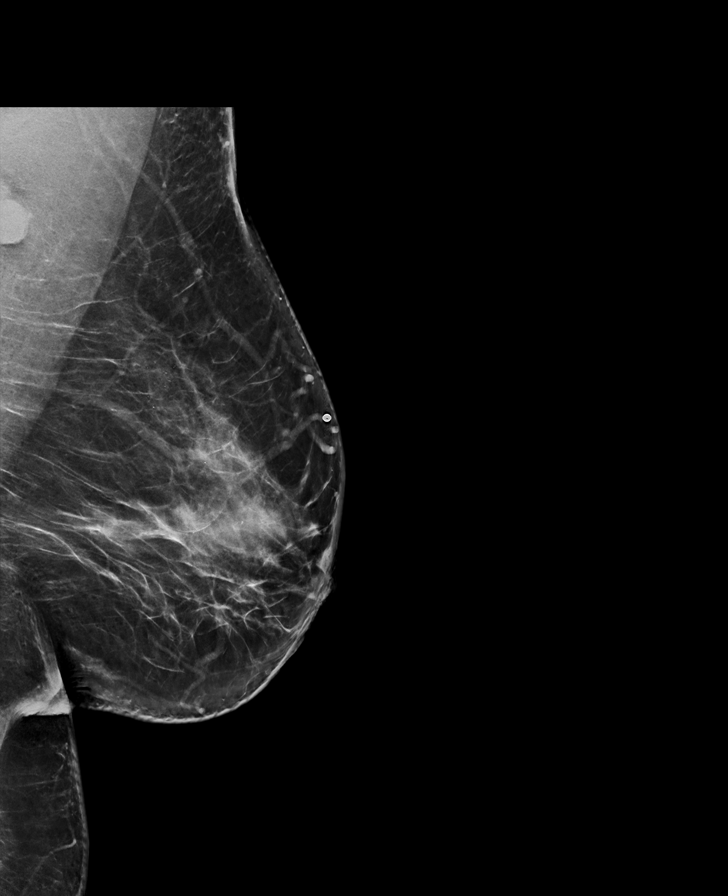

[L TAN synth-2D]
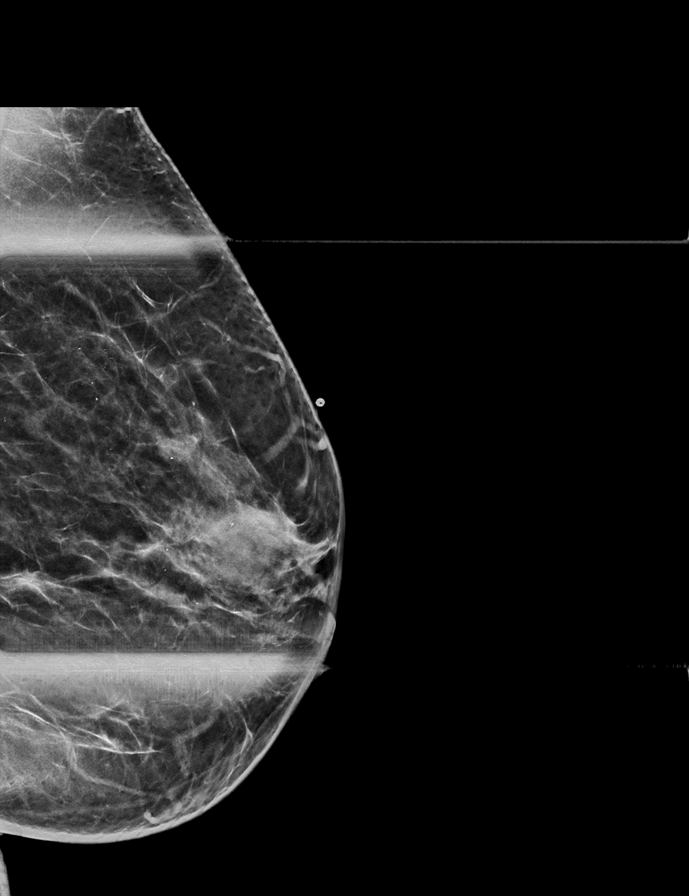

[L MLO synth-2D (2 of 2)]
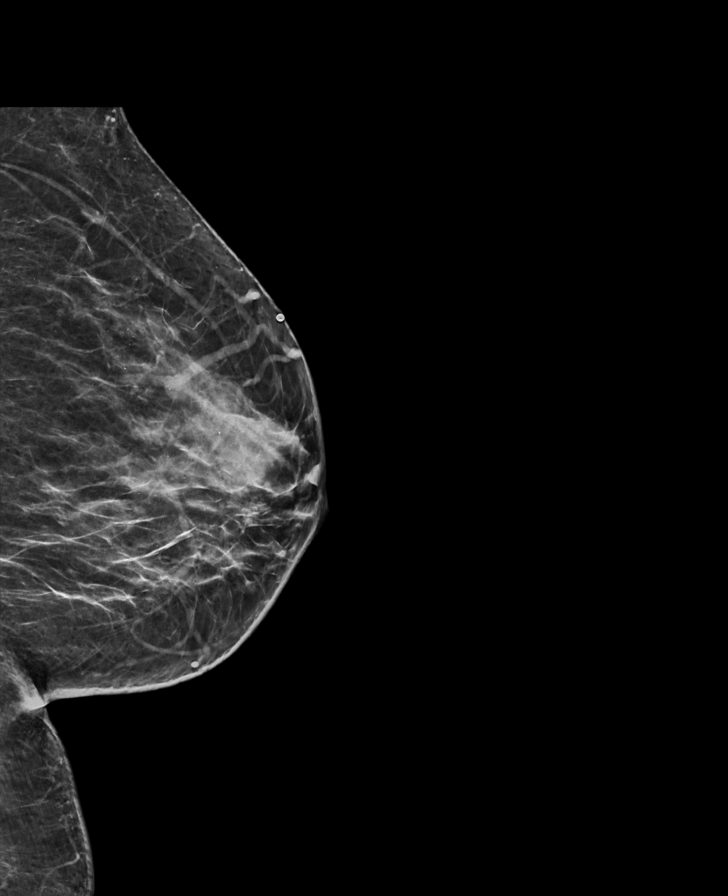

[L CC tomo · tomo slice 29/56.0]
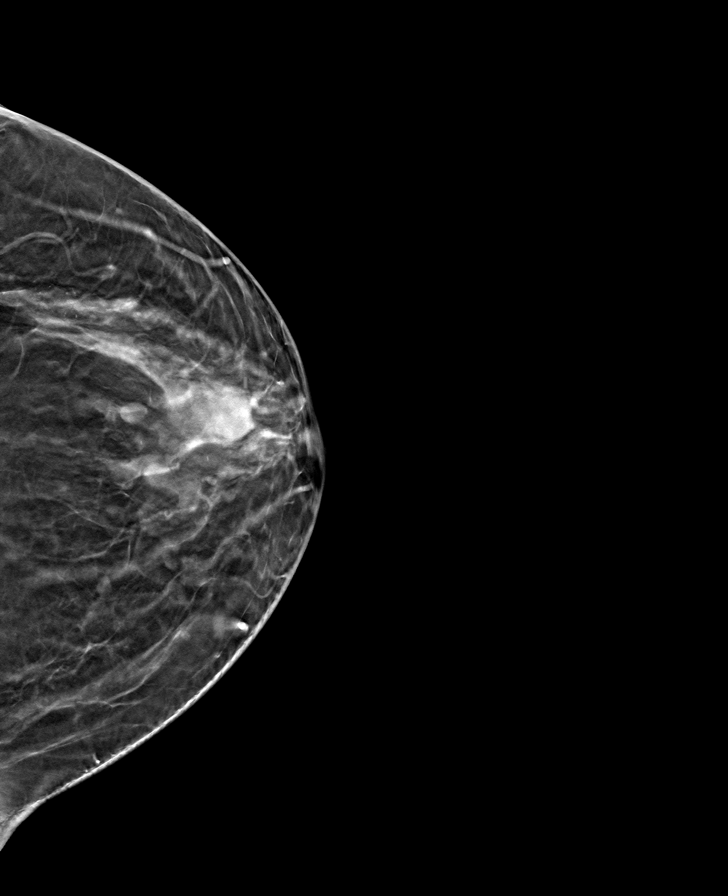

[L MLO tomo (1 of 2) · tomo slice 33/64.0]
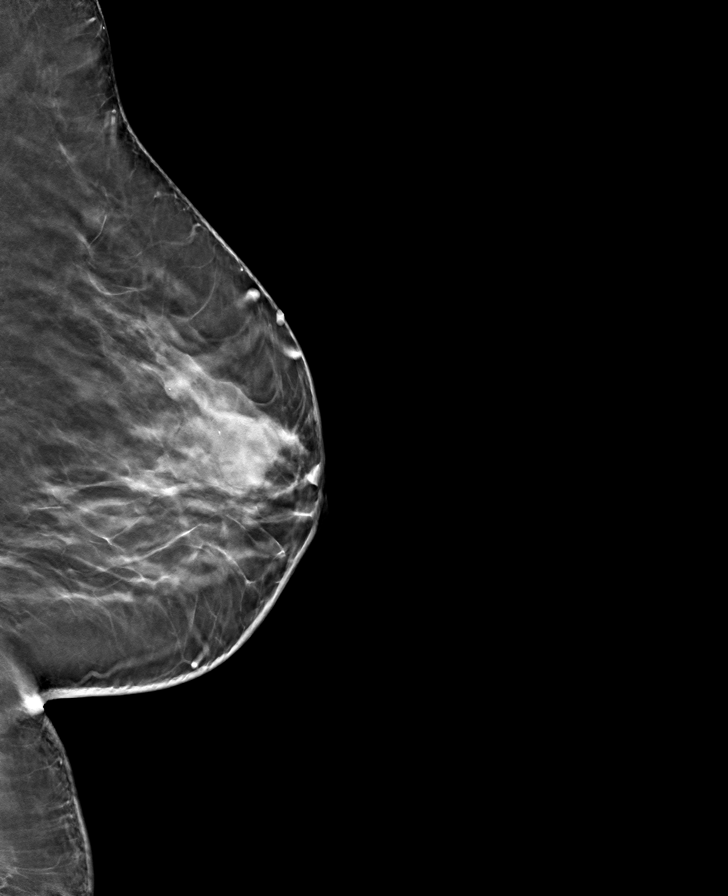

[L TAN tomo · tomo slice 26/51.0]
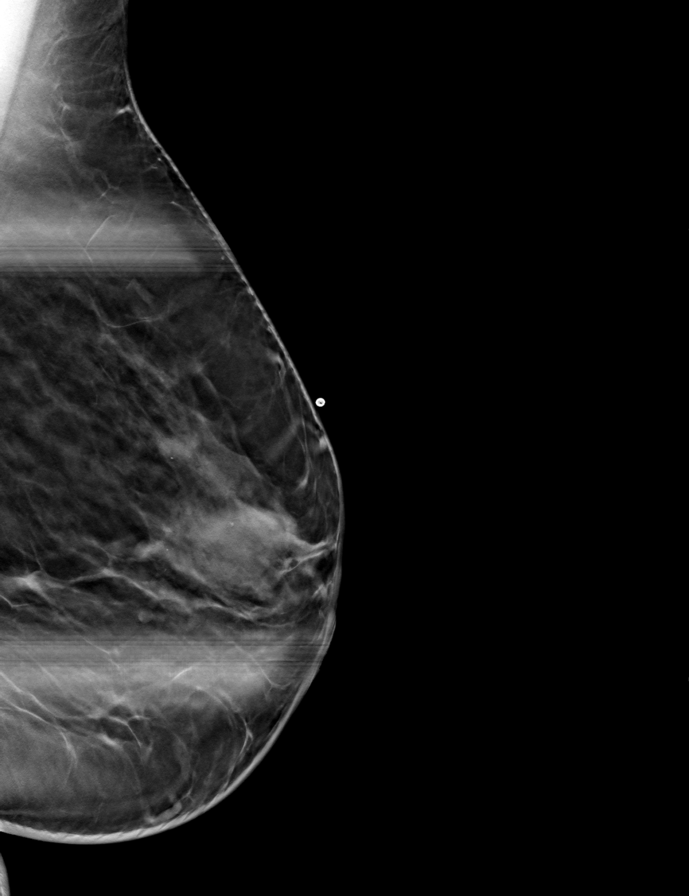

[L MLO tomo (2 of 2) · tomo slice 42/83.0]
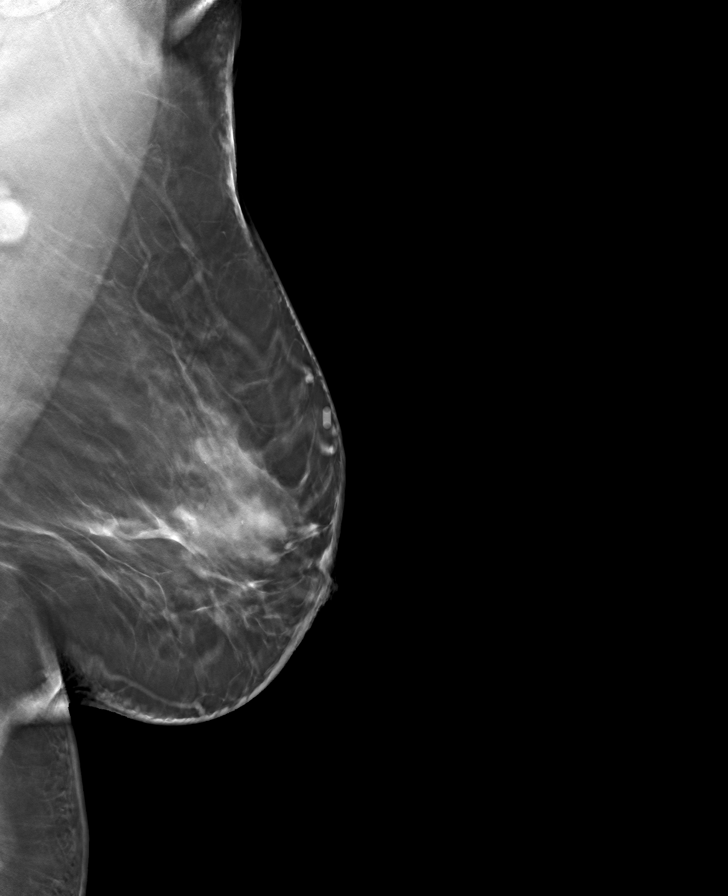

[8 of 24 positions shown; findings below may reference images not displayed]

ACR Breast Density Category c: The breast tissue is heterogeneously
dense, which may obscure small masses.
FINDINGS: In the retroareolar region of the LEFT breast there is a partially
obscured mass marked as palpable with BB. No architectural
distortion or suspicious microcalcifications.

On physical exam, I palpate a firm mass in the retroareolar region
of the LEFT breast. There is no associated erythema or significant
tenderness on exam.

Targeted ultrasound is performed, showing a benign cyst in the 1
o'clock retroareolar region of the LEFT breast measuring 1.9 x 1.2 x
2.0 centimeters. Other sub centimeters cysts are identified in the
retroareolar region. No solid mass or areas of acoustic shadowing.
IMPRESSION: Benign simple cysts in the LEFT breast. No mammographic or
ultrasound evidence for malignancy.

RECOMMENDATION:
Screening is recommended in June 2021.

I have discussed the findings and recommendations with the patient.
If applicable, a reminder letter will be sent to the patient
regarding the next appointment.

BI-RADS CATEGORY  2: Benign.

## 2022-05-02 ENCOUNTER — Telehealth: Payer: Self-pay | Admitting: Family Medicine

## 2022-05-02 NOTE — Telephone Encounter (Signed)
Patient called asking to make an appointment with Dr Tamala Julian for knee injections. I offered her the first available (10/9) but she said that was too long to wait. So I offered her to be seen by another provider but she would only like to see Dr Tamala Julian. She asked to be worked in tomorrow (Tuesday, 9/19). Please advise.

## 2022-05-03 NOTE — Telephone Encounter (Signed)
On schedule tmrw.

## 2022-05-04 ENCOUNTER — Ambulatory Visit: Payer: 59 | Admitting: Family Medicine

## 2022-05-09 ENCOUNTER — Encounter: Payer: Self-pay | Admitting: *Deleted

## 2022-05-11 ENCOUNTER — Encounter: Payer: Self-pay | Admitting: Family Medicine

## 2022-05-11 ENCOUNTER — Ambulatory Visit: Payer: 59 | Admitting: Family Medicine

## 2022-05-11 DIAGNOSIS — M170 Bilateral primary osteoarthritis of knee: Secondary | ICD-10-CM | POA: Diagnosis not present

## 2022-05-11 NOTE — Assessment & Plan Note (Signed)
Patient did do well with the steroid injection previously.  Hopefully this will be improvement as well.  Discussed which activities to do and which ones to avoid.  Increase slowly.  Follow-up again in 6 to 8 weeks.

## 2022-05-11 NOTE — Patient Instructions (Addendum)
Good to see you Injected both knees today Stay active Ice after activity See me again in 10-12 weeks

## 2022-05-11 NOTE — Progress Notes (Signed)
Aimee [name] [place] Phone: [phone] Subjective:   Aimee [name], am serving as a scribe for Dr. Hulan Saas.   I'm seeing this patient by the request  of:  [name], [place]  CC: Bilateral knee pain  VQM:[id number]  12/09/2021 Patient continues to make some progress at this moment.  We discussed icing regimen and home exercises.  Discussed which activities to do or shortness of avoid.  Patient is doing much better and we will hold on any other injection at this moment but will follow-up with me again in 3 months  Update 05/11/2022 Aimee [name] is a 60 y.o. female coming in with complaint of B knee pain. Last injection December 2022. Patient states that both knees are bothering her.       Past Medical History:  Diagnosis Date   Allergy    Anxiety    Arthritis    Depression    Family history of breast cancer    Family history of colon cancer    Family history of pancreatic cancer    GERD (gastroesophageal reflux disease)    Hypertension    Past Surgical History:  Procedure Laterality Date   ABDOMINAL HYSTERECTOMY  2004   CESAREAN SECTION  2003   KNEE ARTHROSCOPY Right 2013   Social History   Socioeconomic History   Marital status: Married    Spouse name: Not on file   Number of children: Not on file   Years of education: Not on file   Highest education level: Not on file  Occupational History   Not on file  Tobacco Use   Smoking status: Never   Smokeless tobacco: Never  Vaping Use   Vaping Use: Never used  Substance and Sexual Activity   Alcohol use: [name]   Drug use: [name]   Sexual activity: Yes    Birth control/protection: Surgical  Other Topics Concern   Not on file  Social History Narrative   Works for the [employer] -- working there for 24 years   Married, 2 kids (23 and 36) and 1 grandson   Social Determinants of Radio broadcast assistant Strain: Not on file  Food Insecurity:  Not on file  Transportation Needs: Not on file  Physical Activity: Not on file  Stress: Not on file  Social Connections: Not on file   [name] Known Allergies Family History  Problem Relation Age of Onset   Colon cancer Mother 46   Cancer Mother    Diabetes Mother    Heart disease Father        CHF   Hypertension Father    Heart attack Father 4   Stroke Father    Diabetes Sister    Breast cancer Sister 56   Congestive Heart Failure Sister    Heart attack Sister    Pancreatic cancer Sister 32   Heart failure Brother    Heart disease Brother        stent   Colon cancer Maternal Grandfather        dx >50   Heart Problems Paternal Uncle      Current Outpatient Medications (Cardiovascular):    amLODipine (NORVASC) 10 MG tablet, TAKE 1 TABLET(10 MG) BY MOUTH DAILY   hydrochlorothiazide (HYDRODIURIL) 25 MG tablet, Take 1 tablet (25 mg total) by mouth daily.  Current Outpatient Medications (Respiratory):    montelukast (SINGULAIR) 10 MG tablet, TAKE 1 TABLET(10 MG) BY MOUTH AT BEDTIME  Current Outpatient Medications (Analgesics):    ibuprofen (ADVIL) 200 MG tablet, Take 400 mg by mouth as needed.   Current Outpatient Medications (Other):    ALPRAZolam (XANAX) 0.5 MG tablet, Take 0.5 mg by mouth 4 (four) times daily as needed.    Cholecalciferol (VITAMIN D3) 1000 units CAPS, Take 1 capsule by mouth daily.   FLUoxetine (PROZAC) 40 MG capsule, fluoxetine 40 mg capsule  TAKE 1 CAPSULE BY MOUTH EVERY DAY   Multiple Vitamin (MULTIVITAMIN) tablet, Take 1 tablet by mouth daily.   triamcinolone (KENALOG) 0.1 %, SMARTSIG:Sparingly Topical Twice Daily   Reviewed prior external information including notes and imaging from  primary care provider As well as notes that were available from care everywhere and other healthcare systems.  Past medical history, social, surgical and family history all reviewed in electronic medical record.  [name] pertanent information unless stated regarding to the  chief complaint.   Review of Systems:  [name] headache, visual changes, nausea, vomiting, diarrhea, constipation, dizziness, abdominal pain, skin rash, fevers, chills, night sweats, weight loss, swollen lymph nodes, body aches, j chest pain, shortness of breath, mood changes. POSITIVE muscle aches, joint swelling  Objective  Blood pressure (!) 132/94, pulse 74, height 5\' 6"  (1.676 m), weight 256 lb (116.1 kg), SpO2 97 %.   General: [name] apparent distress alert and oriented x3 mood and affect normal, dressed appropriately.  HEENT: Pupils equal, extraocular movements intact  Respiratory: Patient's speak in full sentences and does not appear short of breath  Cardiovascular: [name] lower extremity edema, non tender, [name] erythema  The patient does have an antalgic gait noted.  Trace effusion of the knees bilaterally.  Crepitus noted.  Instability noted with valgus and varus force left greater than right.  After informed written and verbal consent, patient was seated on exam table. Left knee was prepped with alcohol swab and utilizing anterolateral approach, patient's left knee space was injected with 4:1  marcaine 0.5%: Kenalog 40mg /dL. Patient tolerated the procedure well without immediate complications.  After informed written and verbal consent, patient was seated on exam table. Right knee was prepped with alcohol swab and utilizing anterolateral approach, patient's right knee space was injected with 4:1  marcaine 0.5%: Kenalog 40mg /dL. Patient tolerated the procedure well without immediate complications.    Impression and Recommendations:    The above documentation has been reviewed and is accurate and complete Lyndal Pulley, DO

## 2022-05-25 ENCOUNTER — Ambulatory Visit: Payer: 59 | Admitting: Family Medicine

## 2022-06-04 ENCOUNTER — Other Ambulatory Visit: Payer: Self-pay | Admitting: Physician Assistant

## 2022-06-05 NOTE — Progress Notes (Unsigned)
Cardiology Office Note:    Date:  06/05/2022   ID:  BREINDEL COLLIER, DOB [birth year], MRN [id number]  PCP:  [name], PA   [facility] Providers Cardiologist:  None {   Referring MD: [name], PA    History of Present Illness:    Aimee [name] is a 61 y.o. female with a hx of HTN, GERD, anxiety, and depression who was referred to [name], PA for further evaluation of HTN. Ca score in 10/2021 was 0.  Today, she is doing well. Since her blood pressure has been high, she reports a headaches localized to her L lateral head. Over the past month, she completely changed her dietary habits. She stopped having soda, sweets, beef, and pork. She drinks only water throughout the day. She exercises at the gym 6 days a week. For 2 days, she goes to the pool. On other days, she walks and uses the machines.   She endorses a family history of cardiovascular disease. Her sister passed away due to pancreatic cancer and heart complications. Her brother also died of cardiovascular disease. Her father and another sister have cardiovascular disease and her other brother has stents. She denies chest pain, chest pressure, dyspnea at rest or with exertion, palpitations, PND, orthopnea, or leg swelling.   Past Medical History:  Diagnosis Date   Allergy    Anxiety    Arthritis    Depression    Family history of breast cancer    Family history of colon cancer    Family history of pancreatic cancer    GERD (gastroesophageal reflux disease)    Hypertension     Past Surgical History:  Procedure Laterality Date   ABDOMINAL HYSTERECTOMY  2004   CESAREAN SECTION  2003   KNEE ARTHROSCOPY Right 2013    Current Medications: No outpatient medications have been marked as taking for the 06/07/22 encounter (Appointment) with Freada Bergeron, MD.     Allergies:   Patient has no known allergies.   Social History   Socioeconomic History   Marital status: Married    Spouse name:  Not on file   Number of children: Not on file   Years of education: Not on file   Highest education level: Not on file  Occupational History   Not on file  Tobacco Use   Smoking status: Never   Smokeless tobacco: Never  Vaping Use   Vaping Use: Never used  Substance and Sexual Activity   Alcohol use: No   Drug use: No   Sexual activity: Yes    Birth control/protection: Surgical  Other Topics Concern   Not on file  Social History Narrative   Works for the [employer] -- working there for 24 years   Married, 2 kids (79 and 44) and 1 grandson   Social Determinants of Radio broadcast assistant Strain: Not on Art therapist Insecurity: Not on file  Transportation Needs: Not on file  Physical Activity: Not on file  Stress: Not on file  Social Connections: Not on file     Family History: The patient's family history includes Breast cancer (age of onset: 19) in her sister; Cancer in her mother; Colon cancer in her maternal grandfather; Colon cancer (age of onset: 47) in her mother; Congestive Heart Failure in her sister; Diabetes in her mother and sister; Heart Problems in her paternal uncle; Heart attack in her sister; Heart attack (age of onset: 47) in her father; Heart disease in her  brother and father; Heart failure in her brother; Hypertension in her father; Pancreatic cancer (age of onset: 77) in her sister; Stroke in her father.  ROS:   Please see the history of present illness.    Review of Systems  Constitutional:  Negative for malaise/fatigue and weight loss.  HENT:  Negative for congestion and sore throat.   Eyes:  Negative for blurred vision.  Respiratory:  Negative for cough and shortness of breath.   Cardiovascular:  Negative for chest pain, palpitations, orthopnea, claudication, leg swelling and PND.  Gastrointestinal:  Negative for heartburn and nausea.  Genitourinary:  Negative for dysuria and urgency.  Musculoskeletal:  Negative for joint pain and myalgias.   Skin:  Negative for itching and rash.  Neurological:  Positive for headaches (L lateral head). Negative for dizziness.  Endo/Heme/Allergies:  Does not bruise/bleed easily.  Psychiatric/Behavioral:  The patient is not nervous/anxious and does not have insomnia.    All other systems reviewed and are negative.  EKGs/Labs/Other Studies Reviewed:    The following studies were reviewed today: Ca score 11/09/21: FINDINGS: Coronary arteries: Normal origins.   Coronary Calcium Score:   Left main: 0   Left anterior descending artery: 0   Left circumflex artery: 0   Right coronary artery: 0   Total: 0   Percentile: 0   Pericardium: Normal.   Ascending Aorta: Normal caliber.   Non-cardiac: See separate report from [facility].   IMPRESSION: Coronary calcium score of 0. This was 0 percentile for age-, race-, and sex-matched controls.   RECOMMENDATIONS: Coronary artery calcium (CAC) score is a strong predictor of incident coronary heart disease (CHD) and provides predictive information beyond traditional risk factors. CAC scoring is reasonable to use in the decision to withhold, postpone, or initiate statin therapy in intermediate-risk or selected borderline-risk asymptomatic adults (age 11-75 years and LDL-C >=70 to <190 mg/dL) who do not have diabetes or established atherosclerotic cardiovascular disease (ASCVD).* In intermediate-risk (10-year ASCVD risk >=7.5% to <20%) adults or selected borderline-risk (10-year ASCVD risk >=5% to <7.5%) adults in whom a CAC score is measured for the purpose of making a treatment decision the following recommendations have been made:   If CAC=0, it is reasonable to withhold statin therapy and reassess in 5 to 10 years, as long as higher risk conditions are absent (diabetes mellitus, family history of premature CHD in first degree relatives (males <55 years; females <65 years), cigarette smoking, or LDL >=190 mg/dL).   If CAC  is 1 to 99, it is reasonable to initiate statin therapy for patients >=52 years of age.   If CAC is >=100 or >=75th percentile, it is reasonable to initiate statin therapy at any age.   Cardiology referral should be considered for patients with CAC scores >=400 or >=75th percentile.   *2018 AHA/ACC/AACVPR/AAPA/ABC/ACPM/ADA/AGS/APhA/ASPC/NLA/PCNA Guideline on the Management of Blood Cholesterol: A Report of the American College of Cardiology/American Heart Association Task Force on Clinical Practice Guidelines. J Am Coll Cardiol. 2019;73(24):3168-3209.   TTE 10/26/21: IMPRESSIONS   1. Left ventricular ejection fraction, by estimation, is 60 to 65%. The  left ventricle has normal function. The left ventricle has no regional  wall motion abnormalities. Left ventricular diastolic parameters are  consistent with Grade I diastolic  dysfunction (impaired relaxation).   2. Right ventricular systolic function is normal. The right ventricular  size is normal. There is normal pulmonary artery systolic pressure.   3. Left atrial size was moderately dilated.   4. The mitral valve is  normal in structure. Trivial mitral valve  regurgitation. No evidence of mitral stenosis.   5. The aortic valve is tricuspid. Aortic valve regurgitation is not  visualized. No aortic stenosis is present.   6. The inferior vena cava is normal in size with greater than 50%  respiratory variability, suggesting right atrial pressure of 3 mmHg.   EKG: EKG was not ordered today   Recent Labs: 11/26/2021: ALT 14; Hemoglobin 11.7; Platelets 264.0 12/14/2021: BUN 18; Creatinine, Ser 0.81; Potassium 3.6; Sodium 141  Recent Lipid Panel    Component Value Date/Time   CHOL 158 11/26/2021 1133   TRIG 52.0 11/26/2021 1133   HDL 51.20 11/26/2021 1133   CHOLHDL 3 11/26/2021 1133   VLDL 10.4 11/26/2021 1133   LDLCALC 96 11/26/2021 1133     Risk Assessment/Calculations:           Physical Exam:    VS:  There were no  vitals taken for this visit.    Wt Readings from Last 3 Encounters:  05/11/22 256 lb (116.1 kg)  12/09/21 247 lb (112 kg)  12/07/21 247 lb 9.6 oz (112.3 kg)     GEN: Well nourished, well developed in no acute distress HEENT: Normal NECK: No JVD; No carotid bruits LYMPHATICS: No lymphadenopathy CARDIAC: RRR, no murmurs, rubs, gallops RESPIRATORY:  Clear to auscultation without rales, wheezing or rhonchi  ABDOMEN: Soft, non-tender, non-distended MUSCULOSKELETAL:  No edema; No deformity  SKIN: Warm and dry NEUROLOGIC:  Alert and oriented x 3 PSYCHIATRIC:  Normal affect   ASSESSMENT:    No diagnosis found.  PLAN:    In order of problems listed above  #HTN: Running mainly 140s/90s at home. Has made significant dietary changes, but pressures persistently elevated.  -Continue amlodipine 10mg  daily -Start HCTZ 25mg  daily -BMET next week -Continue dietary changes including low sodium diet  #Family History of CAD: #HLD: Ca score 0. LDL 92. No need for statin at this time. -Continue dietary and lifestyle modifications -Goal LDL<100  #Obesity: BMI 40. Working on dietary efforts and doing very well. -Continue lifestyle modifications            Medication Adjustments/Labs and Tests Ordered: Current medicines are reviewed at length with the patient today.  Concerns regarding medicines are outlined above.  No orders of the defined types were placed in this encounter.  No orders of the defined types were placed in this encounter.   There are no Patient Instructions on file for this visit.    I,Mykaella Javier,acting as a scribe for Freada Bergeron, MD.,have documented all relevant documentation on the behalf of Freada Bergeron, MD,as directed by  Freada Bergeron, MD while in the presence of Freada Bergeron, MD.  I, Freada Bergeron, MD, have reviewed all documentation for this visit. The documentation on 06/05/22 for the exam, diagnosis, procedures,  and orders are all accurate and complete.   Signed, Freada Bergeron, MD  06/05/2022 8:04 PM    [place]

## 2022-06-07 ENCOUNTER — Encounter: Payer: Self-pay | Admitting: Cardiology

## 2022-06-07 ENCOUNTER — Ambulatory Visit: Payer: 59 | Attending: Cardiology | Admitting: Cardiology

## 2022-06-07 VITALS — BP 134/88 | HR 79 | Ht 66.0 in | Wt 256.2 lb

## 2022-06-07 DIAGNOSIS — Z79899 Other long term (current) drug therapy: Secondary | ICD-10-CM

## 2022-06-07 DIAGNOSIS — I10 Essential (primary) hypertension: Secondary | ICD-10-CM

## 2022-06-07 DIAGNOSIS — Z8249 Family history of ischemic heart disease and other diseases of the circulatory system: Secondary | ICD-10-CM

## 2022-06-07 DIAGNOSIS — E782 Mixed hyperlipidemia: Secondary | ICD-10-CM | POA: Diagnosis not present

## 2022-06-07 MED ORDER — HYDROCHLOROTHIAZIDE 25 MG PO TABS: 25.0000 mg | ORAL_TABLET | Freq: Every day | ORAL | 3 refills | Status: DC

## 2022-06-07 MED ORDER — AMLODIPINE BESYLATE 10 MG PO TABS: 10.0000 mg | ORAL_TABLET | Freq: Every day | ORAL | 3 refills | Status: DC

## 2022-06-07 NOTE — Progress Notes (Signed)
Cardiology Office Note:    Date:  06/07/2022   ID:  Aimee [name], DOB 1960/09/10, MRN [id number]  PCP:  Aimee Coke, PA   [facility] Providers Cardiologist:  None {   Referring MD: Aimee Coke, PA    History of Present Illness:    Aimee [name] is a 61 y.o. female with a hx of HTN, GERD, anxiety, and depression who presents to clinic for follow-up.  Ca score 10/2021 0.   Was initially seen in 11/2021 for hypertension. We started HCTZ at that time.   Today, she reports she is feeling ok. Her blood pressure is still giving her some trouble. She feels that with her medication and working out 4-5 days a week, there blood pressure numbers should be better. Considering her family history of CAD she is very concerned about the effect this may have on her health.  She has significant pain in both knees. She regularly gets injections, but her symptoms have persisted. Her knees have made it especially difficult for her to go up the stairs. She pushes through it keep herself active but is frustrated that it may be impacting her blood pressure. She denies any edema in the LE.   Diet wise she has cut out red meat and pork. The weight seems to slowly be coming off but progress has been slow. She is committed to continuing her efforts with diet and exercise and does not want to take medication to help with her weight loss goals.   She denies any palpitations, chest pain, shortness of breath, or peripheral edema. No lightheadedness, headaches, syncope, orthopnea, or PND.   Past Medical History:  Diagnosis Date   Allergy    Anxiety    Arthritis    Depression    Family history of breast cancer    Family history of colon cancer    Family history of pancreatic cancer    GERD (gastroesophageal reflux disease)    Hypertension     Past Surgical History:  Procedure Laterality Date   ABDOMINAL HYSTERECTOMY  2004   CESAREAN SECTION  2003   KNEE ARTHROSCOPY Right 2013     Current Medications: Current Meds  Medication Sig   ALPRAZolam (XANAX) 0.5 MG tablet Take 0.5 mg by mouth 4 (four) times daily as needed.    Cholecalciferol (VITAMIN D3) 1000 units CAPS Take 1 capsule by mouth daily.   FLUoxetine (PROZAC) 40 MG capsule fluoxetine 40 mg capsule  TAKE 1 CAPSULE BY MOUTH EVERY DAY   ibuprofen (ADVIL) 200 MG tablet Take 400 mg by mouth as needed.   montelukast (SINGULAIR) 10 MG tablet TAKE 1 TABLET(10 MG) BY MOUTH AT BEDTIME   Multiple Vitamin (MULTIVITAMIN) tablet Take 1 tablet by mouth daily.   triamcinolone (KENALOG) 0.1 % SMARTSIG:Sparingly Topical Twice Daily   [DISCONTINUED] amLODipine (NORVASC) 10 MG tablet TAKE 1 TABLET(10 MG) BY MOUTH DAILY   [DISCONTINUED] hydrochlorothiazide (HYDRODIURIL) 25 MG tablet Take 1 tablet (25 mg total) by mouth daily.     Allergies:   Patient has no known allergies.   Social History   Socioeconomic History   Marital status: Married    Spouse name: Not on file   Number of children: Not on file   Years of education: Not on file   Highest education level: Not on file  Occupational History   Not on file  Tobacco Use   Smoking status: Never   Smokeless tobacco: Never  Vaping Use   Vaping Use: Never used  Substance and Sexual Activity   Alcohol use: No   Drug use: No   Sexual activity: Yes    Birth control/protection: Surgical  Other Topics Concern   Not on file  Social History Narrative   Works for the [employer] -- working there for 24 years   Married, 2 kids (34 and 15) and 1 grandson   Social Determinants of Corporate investment bankerHealth   Financial Resource Strain: Not on Ship brokerfile  Food Insecurity: Not on file  Transportation Needs: Not on file  Physical Activity: Not on file  Stress: Not on file  Social Connections: Not on file     Family History: The patient's family history includes Breast cancer (age of onset: 2958) in her sister; Cancer in her mother; Colon cancer in her maternal grandfather; Colon cancer (age of  onset: 5252) in her mother; Congestive Heart Failure in her sister; Diabetes in her mother and sister; Heart Problems in her paternal uncle; Heart attack in her sister; Heart attack (age of onset: 5172) in her father; Heart disease in her brother and father; Heart failure in her brother; Hypertension in her father; Pancreatic cancer (age of onset: 3664) in her sister; Stroke in her father.  ROS:   Please see the history of present illness.    Review of Systems  Constitutional:  Negative for malaise/fatigue and weight loss.  HENT:  Negative for congestion and sore throat.   Eyes:  Negative for blurred vision.  Respiratory:  Negative for cough and shortness of breath.   Cardiovascular:  Negative for chest pain, palpitations, orthopnea, claudication, leg swelling and PND.  Gastrointestinal:  Negative for heartburn and nausea.  Genitourinary:  Negative for dysuria and urgency.  Musculoskeletal:  Positive for joint pain (bilateral knees). Negative for myalgias.  Skin:  Negative for itching and rash.  Neurological:  Negative for dizziness.  Endo/Heme/Allergies:  Does not bruise/bleed easily.  Psychiatric/Behavioral:  The patient is not nervous/anxious and does not have insomnia.    All other systems reviewed and are negative.  EKGs/Labs/Other Studies Reviewed:    The following studies were reviewed today: Ca score 11/09/21: FINDINGS: Coronary arteries: Normal origins.   Coronary Calcium Score:   Left main: 0   Left anterior descending artery: 0   Left circumflex artery: 0   Right coronary artery: 0   Total: 0   Percentile: 0   Pericardium: Normal.   Ascending Aorta: Normal caliber.   Non-cardiac: See separate report from [facility].   IMPRESSION: Coronary calcium score of 0. This was 0 percentile for age-, race-, and sex-matched controls.   RECOMMENDATIONS: Coronary artery calcium (CAC) score is a strong predictor of incident coronary heart disease (CHD) and provides  predictive information beyond traditional risk factors. CAC scoring is reasonable to use in the decision to withhold, postpone, or initiate statin therapy in intermediate-risk or selected borderline-risk asymptomatic adults (age 60-75 years and LDL-C >=70 to <190 mg/dL) who do not have diabetes or established atherosclerotic cardiovascular disease (ASCVD).* In intermediate-risk (10-year ASCVD risk >=7.5% to <20%) adults or selected borderline-risk (10-year ASCVD risk >=5% to <7.5%) adults in whom a CAC score is measured for the purpose of making a treatment decision the following recommendations have been made:   If CAC=0, it is reasonable to withhold statin therapy and reassess in 5 to 10 years, as long as higher risk conditions are absent (diabetes mellitus, family history of premature CHD in first degree relatives (males <55 years; females <65 years), cigarette smoking, or LDL >=190 mg/dL).  If CAC is 1 to 99, it is reasonable to initiate statin therapy for patients >=70 years of age.   If CAC is >=100 or >=75th percentile, it is reasonable to initiate statin therapy at any age.   Cardiology referral should be considered for patients with CAC scores >=400 or >=75th percentile.   *2018 AHA/ACC/AACVPR/AAPA/ABC/ACPM/ADA/AGS/APhA/ASPC/NLA/PCNA Guideline on the Management of Blood Cholesterol: A Report of the American College of Cardiology/American Heart Association Task Force on Clinical Practice Guidelines. J Am Coll Cardiol. 2019;73(24):3168-3209.   TTE 10/26/21: IMPRESSIONS   1. Left ventricular ejection fraction, by estimation, is 60 to 65%. The  left ventricle has normal function. The left ventricle has no regional  wall motion abnormalities. Left ventricular diastolic parameters are  consistent with Grade I diastolic  dysfunction (impaired relaxation).   2. Right ventricular systolic function is normal. The right ventricular  size is normal. There is normal pulmonary  artery systolic pressure.   3. Left atrial size was moderately dilated.   4. The mitral valve is normal in structure. Trivial mitral valve  regurgitation. No evidence of mitral stenosis.   5. The aortic valve is tricuspid. Aortic valve regurgitation is not  visualized. No aortic stenosis is present.   6. The inferior vena cava is normal in size with greater than 50%  respiratory variability, suggesting right atrial pressure of 3 mmHg.   EKG: EKG is personally reviewed.  06/06/2022: EKG was not ordered.   12/07/2021: EKG was not ordered today   Recent Labs: 11/26/2021: ALT 14; Hemoglobin 11.7; Platelets 264.0 12/14/2021: BUN 18; Creatinine, Ser 0.81; Potassium 3.6; Sodium 141  Recent Lipid Panel    Component Value Date/Time   CHOL 158 11/26/2021 1133   TRIG 52.0 11/26/2021 1133   HDL 51.20 11/26/2021 1133   CHOLHDL 3 11/26/2021 1133   VLDL 10.4 11/26/2021 1133   LDLCALC 96 11/26/2021 1133     Risk Assessment/Calculations:           Physical Exam:    VS:  BP 134/88   Pulse 79   Ht 5\' 6"  (1.676 m)   Wt 256 lb 3.2 oz (116.2 kg)   SpO2 96%   BMI 41.35 kg/m     Wt Readings from Last 3 Encounters:  06/07/22 256 lb 3.2 oz (116.2 kg)  05/11/22 256 lb (116.1 kg)  12/09/21 247 lb (112 kg)     GEN: Well nourished, well developed in no acute distress HEENT: Normal NECK: No JVD; No carotid bruits LYMPHATICS: No lymphadenopathy CARDIAC: RRR, no murmurs, rubs, gallops RESPIRATORY:  Clear to auscultation without rales, wheezing or rhonchi  ABDOMEN: Soft, non-tender, non-distended MUSCULOSKELETAL:  No edema; No deformity  SKIN: Warm and dry NEUROLOGIC:  Alert and oriented x 3 PSYCHIATRIC:  Normal affect   ASSESSMENT:    1. Primary hypertension   2. Medication management   3. Family history of early CAD   4. Mixed hyperlipidemia   5. Morbid obesity (HCC)    PLAN:    In order of problems listed above  #HTN: Better controlled mainly 130s at home. Suspect some of her  elevated pressures are due to pain. She would like to continue with current regimen but will let 12/11/21 know if SBP>140, DBP>90.  -Continue amlodipine 10mg  daily -Continue HCTZ 25mg  daily -BMET  -Continue dietary changes including low sodium diet  #Family History of CAD: #HLD: Ca score 0. LDL 92. No need for statin at this time. -Continue dietary and lifestyle modifications -Goal LDL<100  #Obesity: BMI  41.  Working on dietary efforts and exercise. -Continue lifestyle modifications  Exercise recommendations: Goal of exercising for at least 30 minutes a day, at least 5 times per week.  Please exercise to a moderate exertion.  This means that while exercising it is difficult to speak in full sentences, however you are not so short of breath that you feel you must stop, and not so comfortable that you can carry on a full conversation.  Exertion level should be approximately a 5/10, if 10 is the most exertion you can perform.  Diet recommendations: Recommend a heart healthy diet such as the Mediterranean diet.  This diet consists of plant based foods, healthy fats, lean meats, olive oil.  It suggests limiting the intake of simple carbohydrates such as white breads, pastries, and pastas.  It also limits the amount of red meat, wine, and dairy products such as cheese that one should consume on a daily basis.        Follow Up: 6 months   Medication Adjustments/Labs and Tests Ordered: Current medicines are reviewed at length with the patient today.  Concerns regarding medicines are outlined above.  No orders of the defined types were placed in this encounter.  Meds ordered this encounter  Medications   hydrochlorothiazide (HYDRODIURIL) 25 MG tablet    Sig: Take 1 tablet (25 mg total) by mouth daily.    Dispense:  90 tablet    Refill:  3   amLODipine (NORVASC) 10 MG tablet    Sig: Take 1 tablet (10 mg total) by mouth daily.    Dispense:  90 tablet    Refill:  3    Patient Instructions   Medication Instructions:   Your physician recommends that you continue on your current medications as directed. Please refer to the Current Medication list given to you today.   *If you need a refill on your cardiac medications before your next appointment, please call your pharmacy*   Follow-Up: At [facility], you and your health needs are our priority.  As part of our continuing mission to provide you with exceptional heart care, we have created designated Provider Care Teams.  These Care Teams include your primary Cardiologist (physician) and Advanced Practice Providers (APPs -  Physician Assistants and Nurse Practitioners) who all work together to provide you with the care you need, when you need it.  We recommend signing up for the patient portal called "MyChart".  Sign up information is provided on this After Visit Summary.  MyChart is used to connect with patients for Virtual Visits (Telemedicine).  Patients are able to view lab/test results, encounter notes, upcoming appointments, etc.  Non-urgent messages can be sent to your provider as well.   To learn more about what you can do with MyChart, go to [email or web address].    Your next appointment:   6 month(s)  The format for your next appointment:   In Person  Provider:   DR. Shari Prows OR AN EXTENDER  Important Information About Sugar         Jodelle Gross Ford,acting as a scribe for Meriam Sprague, MD.,have documented all relevant documentation on the behalf of Meriam Sprague, MD,as directed by  Meriam Sprague, MD while in the presence of Meriam Sprague, MD.   I, Meriam Sprague, MD, have reviewed all documentation for this visit. The documentation on 06/07/22 for the exam, diagnosis, procedures, and orders are all accurate and complete.   Signed, Meriam Sprague, MD  06/07/2022  4:39 PM    [facility]

## 2022-06-07 NOTE — Patient Instructions (Signed)
Medication Instructions:   Your physician recommends that you continue on your current medications as directed. Please refer to the Current Medication list given to you today.   *If you need a refill on your cardiac medications before your next appointment, please call your pharmacy*   Follow-Up: At [facility], you and your health needs are our priority.  As part of our continuing mission to provide you with exceptional heart care, we have created designated Provider Care Teams.  These Care Teams include your primary Cardiologist (physician) and Advanced Practice Providers (APPs -  Physician Assistants and Nurse Practitioners) who all work together to provide you with the care you need, when you need it.  We recommend signing up for the patient portal called "MyChart".  Sign up information is provided on this After Visit Summary.  MyChart is used to connect with patients for Virtual Visits (Telemedicine).  Patients are able to view lab/test results, encounter notes, upcoming appointments, etc.  Non-urgent messages can be sent to your provider as well.   To learn more about what you can do with MyChart, go to [email or web address].    Your next appointment:   6 month(s)  The format for your next appointment:   In Person  Provider:   DR. Johney Frame OR AN EXTENDER  Important Information About Sugar

## 2022-07-03 ENCOUNTER — Other Ambulatory Visit: Payer: Self-pay | Admitting: Physician Assistant

## 2022-07-18 NOTE — Progress Notes (Unsigned)
[place] Phone: [phone] Subjective:   [name], am serving as a scribe for Dr. Hulan Saas.  I'm seeing this patient by the request  of:  [name], [place]  CC: Bilateral knee pain  [id number]  05/11/2022 Patient did do well with the steroid injection previously. Hopefully this will be improvement as well. Discussed which activities to do and which ones to avoid. Increase slowly. Follow-up again in 6 to 8 weeks.   Update 07/20/2022 Aimee [name] is a 61 y.o. female coming in with complaint of B knee pain. Patient states that her knees are stiff and she is also having a lot of pain in lower leg. Sharp pain at night and if she sits for a long period of time. Would like to get injections today.        Past Medical History:  Diagnosis Date   Allergy    Anxiety    Arthritis    Depression    Family history of breast cancer    Family history of colon cancer    Family history of pancreatic cancer    GERD (gastroesophageal reflux disease)    Hypertension    Past Surgical History:  Procedure Laterality Date   ABDOMINAL HYSTERECTOMY  2004   CESAREAN SECTION  2003   KNEE ARTHROSCOPY Right 2013   Social History   Socioeconomic History   Marital status: Married    Spouse name: Not on file   Number of children: Not on file   Years of education: Not on file   Highest education level: Not on file  Occupational History   Not on file  Tobacco Use   Smoking status: Never   Smokeless tobacco: Never  Vaping Use   Vaping Use: Never used  Substance and Sexual Activity   Alcohol use: No   Drug use: No   Sexual activity: Yes    Birth control/protection: Surgical  Other Topics Concern   Not on file  Social History Narrative   Works for the [employer] -- working there for 24 years   Married, 2 kids (31 and 1) and 1 grandson   Social Determinants of Radio broadcast assistant Strain:  Not on file  Food Insecurity: Not on file  Transportation Needs: Not on file  Physical Activity: Not on file  Stress: Not on file  Social Connections: Not on file   No Known Allergies Family History  Problem Relation Age of Onset   Colon cancer Mother 65   Cancer Mother    Diabetes Mother    Heart disease Father        CHF   Hypertension Father    Heart attack Father 31   Stroke Father    Diabetes Sister    Breast cancer Sister 87   Congestive Heart Failure Sister    Heart attack Sister    Pancreatic cancer Sister 70   Heart failure Brother    Heart disease Brother        stent   Colon cancer Maternal Grandfather        dx >50   Heart Problems Paternal Uncle      Current Outpatient Medications (Cardiovascular):    amLODipine (NORVASC) 10 MG tablet, Take 1 tablet (10 mg total) by mouth daily.   hydrochlorothiazide (HYDRODIURIL) 25 MG tablet, Take 1 tablet (25 mg total) by mouth daily.  Current Outpatient Medications (Respiratory):    montelukast (SINGULAIR)  10 MG tablet, TAKE 1 TABLET(10 MG) BY MOUTH AT BEDTIME  Current Outpatient Medications (Analgesics):    ibuprofen (ADVIL) 200 MG tablet, Take 400 mg by mouth as needed.   Current Outpatient Medications (Other):    ALPRAZolam (XANAX) 0.5 MG tablet, Take 0.5 mg by mouth 4 (four) times daily as needed.    Cholecalciferol (VITAMIN D3) 1000 units CAPS, Take 1 capsule by mouth daily.   FLUoxetine (PROZAC) 40 MG capsule, fluoxetine 40 mg capsule  TAKE 1 CAPSULE BY MOUTH EVERY DAY   Multiple Vitamin (MULTIVITAMIN) tablet, Take 1 tablet by mouth daily.   triamcinolone (KENALOG) 0.1 %, SMARTSIG:Sparingly Topical Twice Daily   Reviewed prior external information including notes and imaging from  primary care provider As well as notes that were available from care everywhere and other healthcare systems.  Past medical history, social, surgical and family history all reviewed in electronic medical record.  No pertanent  information unless stated regarding to the chief complaint.   Review of Systems:  No headache, visual changes, nausea, vomiting, diarrhea, constipation, dizziness, abdominal pain, skin rash, fevers, chills, night sweats, weight loss, swollen lymph nodes,  chest pain, shortness of breath, mood changes. POSITIVE muscle aches, body aches, joint swelling  Objective  Blood pressure 122/86, pulse 77, height 5\' 6"  (1.676 m), weight 253 lb (114.8 kg), SpO2 97 %.   General: No apparent distress alert and oriented x3 mood and affect normal, dressed appropriately.  HEENT: Pupils equal, extraocular movements intact  Respiratory: Patient's speak in full sentences and does not appear short of breath  Cardiovascular: No lower extremity edema, non tender, no erythema  Bilateral knee exam does have some crepitus noted.  Some tenderness to palpation instability noted.  Trace effusion noted of the knees.   After informed written and verbal consent, patient was seated on exam table. Right knee was prepped with alcohol swab and utilizing anterolateral approach, patient's right knee space was injected with 4:1  marcaine 0.5%: Kenalog 40mg /dL. Patient tolerated the procedure well without immediate complications.  After informed written and verbal consent, patient was seated on exam table. Left knee was prepped with alcohol swab and utilizing anterolateral approach, patient's left knee space was injected with 4:1  marcaine 0.5%: Kenalog 40mg /dL. Patient tolerated the procedure well without immediate complications.   Impression and Recommendations:    The above documentation has been reviewed and is accurate and complete , DO

## 2022-07-20 ENCOUNTER — Encounter: Payer: Self-pay | Admitting: Family Medicine

## 2022-07-20 ENCOUNTER — Ambulatory Visit: Payer: 59 | Admitting: Family Medicine

## 2022-07-20 VITALS — BP 122/86 | HR 77 | Ht 66.0 in | Wt 253.0 lb

## 2022-07-20 DIAGNOSIS — M170 Bilateral primary osteoarthritis of knee: Secondary | ICD-10-CM | POA: Diagnosis not present

## 2022-07-20 DIAGNOSIS — G4700 Insomnia, unspecified: Secondary | ICD-10-CM

## 2022-07-20 DIAGNOSIS — R0683 Snoring: Secondary | ICD-10-CM

## 2022-07-20 DIAGNOSIS — R252 Cramp and spasm: Secondary | ICD-10-CM | POA: Diagnosis not present

## 2022-07-20 NOTE — Assessment & Plan Note (Signed)
Has had difficulty with snoring and having leg cramps.  Will send patient for potential sleep study to see if this could be contributing.  Has also had difficulty with her high blood pressure.

## 2022-07-20 NOTE — Assessment & Plan Note (Signed)
Discussed leg cramping.  Discussed iron deficiency being a potential cause.  Sleep apnea is another cause.  Will we will get the sleep study to further evaluate.

## 2022-07-20 NOTE — Assessment & Plan Note (Signed)
Repeat injections given today.  Tolerated the procedure well, discussed icing regimen and home exercises.  Discussed the importance of weight loss.  Patient has lost some weight since we have seen her last.  We discussed with patient that otherwise surgical intervention would be potentially necessary.  Follow-up with me again in 10weeks

## 2022-07-20 NOTE — Patient Instructions (Addendum)
Good to see you  Bilateral knee injections given today Referral for pulmonology placed they will call you  Iron    OTC 65 mg  daily Vit C    OTC 500mg  daily Happy holidays!   Tell Dr. hi!

## 2022-07-27 ENCOUNTER — Ambulatory Visit: Payer: 59 | Admitting: Physician Assistant

## 2022-07-27 ENCOUNTER — Encounter: Payer: Self-pay | Admitting: Physician Assistant

## 2022-07-27 VITALS — BP 130/86 | HR 78 | Temp 98.0°F | Ht 66.0 in | Wt 256.5 lb

## 2022-07-27 DIAGNOSIS — R051 Acute cough: Secondary | ICD-10-CM

## 2022-07-27 MED ORDER — ALBUTEROL SULFATE HFA 108 (90 BASE) MCG/ACT IN AERS: 2.0000 | INHALATION_SPRAY | Freq: Four times a day (QID) | RESPIRATORY_TRACT | 0 refills | Status: DC | PRN

## 2022-07-27 MED ORDER — AZITHROMYCIN 250 MG PO TABS: ORAL_TABLET | ORAL | 0 refills | Status: AC

## 2022-07-27 NOTE — Patient Instructions (Addendum)
It was great to see you!  Start azithromycin antibiotic Continue singulair and use daily Push fluids I recommend 12-hour delsym (or dextromethorphan) for cough Albuterol inhaler as needed Call with any concerns or if no better  Take care,  [name] PA-C

## 2022-07-27 NOTE — Progress Notes (Signed)
Aimee [name] is a 61 y.o. female here for a new problem.  History of Present Illness:   Chief Complaint  Patient presents with   Cough    Pt c/o dry wheezing cough x several weeks. Denies fever or chills.    HPI  Cough Her symptoms began a few weeks ago and started similarly to her allergies with associated nasal congestion. She then developed a cough and wheezing which have persisted. Her cough was productive but is now dry. Her cough is most severe when she wakes up in the mornings. Accompanying chest tightness and sinus congestion. Her symptoms have improved somewhat since onset. She is sleeping okay despite her symptoms.  She is taking Singulair but not every day. She wonders if she can try Symbicort again as this helped her in the past. She is not using any nasal sprays or cough medications.  She developed symptoms x1 week after returning from a trip. Denies any known sick contacts or anyone else sick at home.  Denies any fever or chills. Denies any chest pain or shortness of breath. Denies any ear pain, pressure, or crackling.  She is scheduled to establish care with pulmonology on 08/18/22 for possible sleep apnea.    Past Medical History:  Diagnosis Date   Allergy    Anxiety    Arthritis    Depression    Family history of breast cancer    Family history of colon cancer    Family history of pancreatic cancer    GERD (gastroesophageal reflux disease)    Hypertension      Social History   Tobacco Use   Smoking status: Never   Smokeless tobacco: Never  Vaping Use   Vaping Use: Never used  Substance Use Topics   Alcohol use: No   Drug use: No    Past Surgical History:  Procedure Laterality Date   ABDOMINAL HYSTERECTOMY  2004   CESAREAN SECTION  2003   KNEE ARTHROSCOPY Right 2013    Family History  Problem Relation Age of Onset   Colon cancer Mother 26   Cancer Mother    Diabetes Mother    Heart disease Father        CHF   Hypertension  Father    Heart attack Father 45   Stroke Father    Diabetes Sister    Breast cancer Sister 8   Congestive Heart Failure Sister    Heart attack Sister    Pancreatic cancer Sister 64   Heart failure Brother    Heart disease Brother        stent   Colon cancer Maternal Grandfather        dx >50   Heart Problems Paternal Uncle     No Known Allergies  Current Medications:   Current Outpatient Medications:    albuterol (VENTOLIN HFA) 108 (90 Base) MCG/ACT inhaler, Inhale 2 puffs into the lungs every 6 (six) hours as needed for wheezing or shortness of breath., Disp: 8 g, Rfl: 0   ALPRAZolam (XANAX) 0.5 MG tablet, Take 0.5 mg by mouth 4 (four) times daily as needed. , Disp: , Rfl:    amLODipine (NORVASC) 10 MG tablet, Take 1 tablet (10 mg total) by mouth daily., Disp: 90 tablet, Rfl: 3   azithromycin (ZITHROMAX) 250 MG tablet, Take 2 tablets on day 1, then 1 tablet daily on days 2 through 5, Disp: 6 tablet, Rfl: 0   Cholecalciferol (VITAMIN D3) 1000 units CAPS, Take 1 capsule by mouth  daily., Disp: , Rfl:    FLUoxetine (PROZAC) 40 MG capsule, fluoxetine 40 mg capsule  TAKE 1 CAPSULE BY MOUTH EVERY DAY, Disp: , Rfl:    hydrochlorothiazide (HYDRODIURIL) 25 MG tablet, Take 1 tablet (25 mg total) by mouth daily., Disp: 90 tablet, Rfl: 3   ibuprofen (ADVIL) 200 MG tablet, Take 400 mg by mouth as needed., Disp: , Rfl:    montelukast (SINGULAIR) 10 MG tablet, TAKE 1 TABLET(10 MG) BY MOUTH AT BEDTIME, Disp: 90 tablet, Rfl: 1   Multiple Vitamin (MULTIVITAMIN) tablet, Take 1 tablet by mouth daily., Disp: , Rfl:    triamcinolone (KENALOG) 0.1 %, SMARTSIG:Sparingly Topical Twice Daily, Disp: , Rfl:    Review of Systems:   Review of Systems  Constitutional:  Negative for chills and fever.  HENT:  Positive for congestion. Negative for ear discharge, ear pain, hearing loss, sinus pain and tinnitus.   Respiratory:  Positive for cough and wheezing. Negative for shortness of breath.        Positive  for chest tightness  Cardiovascular:  Negative for chest pain.  Psychiatric/Behavioral:  The patient does not have insomnia.     Vitals:   Vitals:   07/27/22 1534  BP: 130/86  Pulse: 78  Temp: 98 F (36.7 C)  TempSrc: Temporal  SpO2: 96%  Weight: 256 lb 8 oz (116.3 kg)  Height: 5\' 6"  (1.676 m)     Body mass index is 41.4 kg/m.  Physical Exam:   Physical Exam Vitals and nursing note reviewed.  Constitutional:      General: She is not in acute distress.    Appearance: She is well-developed. She is not ill-appearing or toxic-appearing.  HENT:     Head: Normocephalic and atraumatic.     Right Ear: Tympanic membrane, ear canal and external ear normal. Tympanic membrane is not erythematous, retracted or bulging.     Left Ear: Tympanic membrane, ear canal and external ear normal. Tympanic membrane is not erythematous, retracted or bulging.     Nose:     Right Sinus: Frontal sinus tenderness present. No maxillary sinus tenderness.     Left Sinus: Frontal sinus tenderness present. No maxillary sinus tenderness.     Mouth/Throat:     Pharynx: Uvula midline. No posterior oropharyngeal erythema.  Eyes:     General: Lids are normal.     Conjunctiva/sclera: Conjunctivae normal.  Neck:     Trachea: Trachea normal.  Cardiovascular:     Rate and Rhythm: Normal rate and regular rhythm.     Heart sounds: Normal heart sounds, S1 normal and S2 normal.  Pulmonary:     Effort: Pulmonary effort is normal.     Breath sounds: Normal breath sounds. No decreased breath sounds, wheezing, rhonchi or rales.  Lymphadenopathy:     Cervical: No cervical adenopathy.  Skin:    General: Skin is warm and dry.  Neurological:     Mental Status: She is alert.  Psychiatric:        Speech: Speech normal.        Behavior: Behavior normal. Behavior is cooperative.     Assessment and Plan:   Acute cough No red flags on exam.  Will initiate oral azithromycin and albuterol prn per orders. I  recommended that she start taking singulair daily and consider dextromethorphan 12-hour. Discussed taking medications as prescribed. Reviewed return precautions including worsening fever, SOB, worsening cough or other concerns. Push fluids and rest. I recommend that patient follow-up if symptoms worsen or persist  despite treatment x 7-10 days, sooner if needed.  I,Alexis Herring,acting as a Education administrator for [employer], PA.,have documented all relevant documentation on the behalf of [name], PA,as directed by  [name], PA while in the presence of [name], [place].  I, [name], [place], have reviewed all documentation for this visit. The documentation on 07/27/22 for the exam, diagnosis, procedures, and orders are all accurate and complete.   [name], PA-C

## 2022-08-18 ENCOUNTER — Encounter: Payer: Self-pay | Admitting: Nurse Practitioner

## 2022-08-18 ENCOUNTER — Ambulatory Visit: Payer: 59 | Admitting: Nurse Practitioner

## 2022-08-18 VITALS — BP 122/78 | HR 76 | Ht 66.0 in | Wt 258.2 lb

## 2022-08-18 DIAGNOSIS — R0683 Snoring: Secondary | ICD-10-CM | POA: Diagnosis not present

## 2022-08-18 DIAGNOSIS — G4719 Other hypersomnia: Secondary | ICD-10-CM

## 2022-08-18 NOTE — Progress Notes (Signed)
_0  ID: Aimee [name], female    DOB: 02-02-1961, 62 y.o.   MRN: [id number]  Chief Complaint  Patient presents with   Consult    Pt consult for sleep she snores, has insomnia, a dry mouth on occasion, and tingling legs. She states she has never had a sleep study. Reports getting approx 8 hours of sleep a night    Referring provider: Lyndal Pulley, DO  HPI: 62 year old female, never smoker referred for sleep consult.  Past medical history significant for hypertension, bilateral arthritis of the knee, DDD, anxiety, depression, obesity.  TEST/EVENTS:   08/18/2022: Today-sleep consult Patient presents today for sleep consult referred by Dr. Tamala Julian.  She has been having issues with her sleep for many years now.  She has been told that she snores very loudly and will stop breathing at night.  Wakes up in the morning with dry mouth.  She has daytime fatigue.  Wakes feeling poorly rested.  She also wakes frequently throughout the night.  She denies any morning headaches, drowsy driving, sleep parasomnia/paralysis.  No history of narcolepsy or cataplexy.  She did have some issues with tingling in her lower extremities a few months ago.  Her primary care provider started her on iron and vitamin D.  The symptoms have resolved.  Does not necessarily feel like her legs were ever restless. She goes to bed between 9 and 10 PM.  Usually falls asleep pretty quickly.  Wakes a few times a night, usually to use the restroom.  She attributes this to drinking a lot of water.  She officially gets up in the morning around 6:30 AM.  No sleep aids.  No excessive caffeine intake.  Works in Editor, commissioning.  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  She is down 10 pounds over the last 2 years.  Working on weight loss and exercising more regularly.  Has never had a previous sleep study. She has a history of high blood pressure, well-controlled on antihypertensives.  No history of stroke.  She had  hysterectomy 20 years ago.  She is a never smoker.  Does not drink alcohol.  Lives at home with her husband.  She has 2 children, 20 and 40.  Past family history significant for father, brothers and sisters with heart disease; Mother and sisters with cancer.  Epworth 18  No Known Allergies  Immunization History  Administered Date(s) Administered   Influenza Split 06/27/2005, 06/26/2013   Influenza,inj,Quad PF,6+ Mos 06/28/2016   Influenza-Unspecified 07/03/2017, 05/30/2018, 05/29/2019, 06/15/2020, 07/14/2021, 05/15/2022   Moderna Covid Bivalent Peds Booster([id number] 05/12/2021   Moderna Sars-Covid-2 Vaccination 10/24/2019, 11/26/2019, 07/25/2020   Tdap 02/22/2008, 01/11/2018   Zoster Recombinat (Shingrix) 05/12/2021, 11/09/2021   Zoster, Unspecified 06/15/2021    Past Medical History:  Diagnosis Date   Allergy    Anxiety    Arthritis    Depression    Family history of breast cancer    Family history of colon cancer    Family history of pancreatic cancer    GERD (gastroesophageal reflux disease)    Hypertension     Tobacco History: Social History   Tobacco Use  Smoking Status Never  Smokeless Tobacco Never   Counseling given: Not Answered   Outpatient Medications Prior to Visit  Medication Sig Dispense Refill   albuterol (VENTOLIN HFA) 108 (90 Base) MCG/ACT inhaler Inhale 2 puffs into the lungs every 6 (six) hours as needed for wheezing or shortness of breath. 8 g 0  ALPRAZolam (XANAX) 0.5 MG tablet Take 0.5 mg by mouth 4 (four) times daily as needed.      amLODipine (NORVASC) 10 MG tablet Take 1 tablet (10 mg total) by mouth daily. 90 tablet 3   Cholecalciferol (VITAMIN D3) 1000 units CAPS Take 1 capsule by mouth daily.     FLUoxetine (PROZAC) 40 MG capsule fluoxetine 40 mg capsule  TAKE 1 CAPSULE BY MOUTH EVERY DAY     hydrochlorothiazide (HYDRODIURIL) 25 MG tablet Take 1 tablet (25 mg total) by mouth daily. 90 tablet 3   ibuprofen (ADVIL) 200 MG tablet Take  400 mg by mouth as needed.     montelukast (SINGULAIR) 10 MG tablet TAKE 1 TABLET(10 MG) BY MOUTH AT BEDTIME 90 tablet 1   Multiple Vitamin (MULTIVITAMIN) tablet Take 1 tablet by mouth daily.     triamcinolone (KENALOG) 0.1 % SMARTSIG:Sparingly Topical Twice Daily     No facility-administered medications prior to visit.     Review of Systems:   Constitutional: No weight loss or gain, night sweats, fevers, chills, or lassitude. +excessive daytime fatigue  HEENT: No headaches, difficulty swallowing, tooth/dental problems, or sore throat. No sneezing, itching, ear ache, nasal congestion, or post nasal drip. +dry mouth CV:  No chest pain, orthopnea, PND, swelling in lower extremities, anasarca, dizziness, palpitations, syncope Resp: +snoring, witnessed apneas. No shortness of breath with exertion or at rest. No excess mucus or change in color of mucus. No productive or non-productive. No hemoptysis. No wheezing.  No chest wall deformity GI:  No heartburn, indigestion, abdominal pain, loss of appetite GU: +nocturia. No dysuria, change in color of urine, urgency or frequency Skin: No rash, lesions, ulcerations MSK:  No joint pain or swelling.   Neuro: No dizziness or lightheadedness.  Psych: No depression or anxiety. Mood stable. +sleep disturbance    Physical Exam:  BP 122/78   Pulse 76   Ht _0  (1.676 m)   Wt 258 lb 3.2 oz (117.1 kg)   SpO2 100%   BMI 41.67 kg/m   GEN: Pleasant, interactive, well-appearing; obese; in no acute distress. HEENT:  Normocephalic and atraumatic. PERRLA. Sclera white. Nasal turbinates pink, moist and patent bilaterally. No rhinorrhea present. Oropharynx pink and moist, without exudate or edema. No lesions, ulcerations, or postnasal drip. Mallampati II/III NECK:  Supple w/ fair ROM. No JVD present. Normal carotid impulses w/o bruits. Thyroid symmetrical with no goiter or nodules palpated. No lymphadenopathy.   CV: RRR, no m/r/g, no peripheral edema. Pulses  intact, +2 bilaterally. No cyanosis, pallor or clubbing. PULMONARY:  Unlabored, regular breathing. Clear bilaterally A&P w/o wheezes/rales/rhonchi. No accessory muscle use.  GI: BS present and normoactive. Soft, non-tender to palpation. No organomegaly or masses detected.  MSK: No erythema, warmth or tenderness. Cap refil <2 sec all extrem. No deformities or joint swelling noted.  Neuro: A/Ox3. No focal deficits noted.   Skin: Warm, no lesions or rashe Psych: Normal affect and behavior. Judgement and thought content appropriate.     Lab Results:  CBC    Component Value Date/Time   WBC 5.5 11/26/2021 1133   RBC 4.65 11/26/2021 1133   HGB 11.7 (L) 11/26/2021 1133   HCT 36.9 11/26/2021 1133   PLT 264.0 11/26/2021 1133   MCV 79.5 11/26/2021 1133   MCHC 31.6 11/26/2021 1133   RDW 15.1 11/26/2021 1133   LYMPHSABS 2.0 11/26/2021 1133   MONOABS 0.5 11/26/2021 1133   EOSABS 0.1 11/26/2021 1133   BASOSABS 0.0 11/26/2021 1133  BMET    Component Value Date/Time   NA 141 12/14/2021 1221   K 3.6 12/14/2021 1221   CL 100 12/14/2021 1221   CO2 23 12/14/2021 1221   GLUCOSE 89 12/14/2021 1221   GLUCOSE 76 11/26/2021 1133   BUN 18 12/14/2021 1221   CREATININE 0.81 12/14/2021 1221   CALCIUM 9.1 12/14/2021 1221   GFRNONAA >60 01/18/2007 1400   GFRAA  01/18/2007 1400    >60        The eGFR has been calculated using the MDRD equation. This calculation has not been validated in all clinical    BNP No results found for: "BNP"   Imaging:  No results found.        No data to display          No results found for: "NITRICOXIDE"      Assessment & Plan:   Excessive daytime sleepiness She has snoring, excessive daytime sleepiness, nocturnal apneic events, morning dry mouth, restless sleep. BMI 41. Epworth 18. Given this,  I am concerned she could have sleep disordered breathing with obstructive sleep apnea. She will need sleep study for further evaluation.    -  discussed how weight can impact sleep and risk for sleep disordered breathing - discussed options to assist with weight loss: combination of diet modification, cardiovascular and strength training exercises   - had an extensive discussion regarding the adverse health consequences related to untreated sleep disordered breathing - specifically discussed the risks for hypertension, coronary artery disease, cardiac dysrhythmias, cerebrovascular disease, and diabetes - lifestyle modification discussed   - discussed how sleep disruption can increase risk of accidents, particularly when driving - safe driving practices were discussed  Patient Instructions  Given your symptoms, I am concerned that you may have sleep disordered breathing with sleep apnea. You will need a sleep study for further evaluation. Someone will contact you to schedule this.  We discussed how untreated sleep apnea puts an individual at risk for cardiac arrhthymias, pulm HTN, DM, stroke and increases their risk for daytime accidents. We also briefly reviewed treatment options including weight loss, side sleeping position, oral appliance, CPAP therapy or referral to ENT for possible surgical options  Caution to be aware of drowsy driving and pull over if you become sleepy  Follow up in 12-14 weeks with Joellen Jersey ,NP to go over sleep study results, or sooner if needed    Snoring See above.   Morbid obesity (HCC) BMI 41. Healthy weight loss encouraged.    I spent 35 minutes of dedicated to the care of this patient on the date of this encounter to include pre-visit review of records, face-to-face time with the patient discussing conditions above, post visit ordering of testing, clinical documentation with the electronic health record, making appropriate referrals as documented, and communicating necessary findings to members of the patients care team.  Clayton Bibles, NP 08/18/2022  Pt aware and understands NP's role.

## 2022-08-18 NOTE — Assessment & Plan Note (Signed)
See above

## 2022-08-18 NOTE — Assessment & Plan Note (Signed)
BMI 41. Healthy weight loss encouraged.

## 2022-08-18 NOTE — Assessment & Plan Note (Signed)
She has snoring, excessive daytime sleepiness, nocturnal apneic events, morning dry mouth, restless sleep. BMI 41. Epworth 18. Given this,  I am concerned she could have sleep disordered breathing with obstructive sleep apnea. She will need sleep study for further evaluation.    - discussed how weight can impact sleep and risk for sleep disordered breathing - discussed options to assist with weight loss: combination of diet modification, cardiovascular and strength training exercises   - had an extensive discussion regarding the adverse health consequences related to untreated sleep disordered breathing - specifically discussed the risks for hypertension, coronary artery disease, cardiac dysrhythmias, cerebrovascular disease, and diabetes - lifestyle modification discussed   - discussed how sleep disruption can increase risk of accidents, particularly when driving - safe driving practices were discussed  Patient Instructions  Given your symptoms, I am concerned that you may have sleep disordered breathing with sleep apnea. You will need a sleep study for further evaluation. Someone will contact you to schedule this.  We discussed how untreated sleep apnea puts an individual at risk for cardiac arrhthymias, pulm HTN, DM, stroke and increases their risk for daytime accidents. We also briefly reviewed treatment options including weight loss, side sleeping position, oral appliance, CPAP therapy or referral to ENT for possible surgical options  Caution to be aware of drowsy driving and pull over if you become sleepy  Follow up in 12-14 weeks with Joellen Jersey ,NP to go over sleep study results, or sooner if needed

## 2022-08-18 NOTE — Patient Instructions (Addendum)
Given your symptoms, I am concerned that you may have sleep disordered breathing with sleep apnea. You will need a sleep study for further evaluation. Someone will contact you to schedule this.  We discussed how untreated sleep apnea puts an individual at risk for cardiac arrhthymias, pulm HTN, DM, stroke and increases their risk for daytime accidents. We also briefly reviewed treatment options including weight loss, side sleeping position, oral appliance, CPAP therapy or referral to ENT for possible surgical options  Caution to be aware of drowsy driving and pull over if you become sleepy  Follow up in 12-14 weeks with Aimee [name] ,NP to go over sleep study results, or sooner if needed

## 2022-08-19 NOTE — Progress Notes (Signed)
Reviewed and agree with assessment/plan.    , MD [facility] 08/19/2022, 8:27 AM Pager:  [phone]  

## 2022-09-01 ENCOUNTER — Ambulatory Visit: Payer: 59 | Admitting: Family Medicine

## 2022-09-12 LAB — HM MAMMOGRAPHY

## 2022-09-21 ENCOUNTER — Ambulatory Visit: Payer: 59

## 2022-09-21 DIAGNOSIS — G4719 Other hypersomnia: Secondary | ICD-10-CM

## 2022-09-21 DIAGNOSIS — R0683 Snoring: Secondary | ICD-10-CM

## 2022-09-23 NOTE — Progress Notes (Deleted)
[facility] [address] [place] Phone: [phone] Subjective:    I'm seeing this patient by the request  of:  [name], [place]  CC:   [id number]  07/20/2022 Discussed leg cramping.  Discussed iron deficiency being a potential cause.  Sleep apnea is another cause.  Will we will get the sleep study to further evaluate     Has had difficulty with snoring and having leg cramps.  Will send patient for potential sleep study to see if this could be contributing.  Has also had difficulty with her high blood pressure     Repeat injections given today.  Tolerated the procedure well, discussed icing regimen and home exercises.  Discussed the importance of weight loss.  Patient has lost some weight since we have seen her last.  We discussed with patient that otherwise surgical intervention would be potentially necessary.  Follow-up with me again in [place] is a 62 y.o. female coming in with complaint of B knee pain       Past Medical History:  Diagnosis Date   Allergy    Anxiety    Arthritis    Depression    Family history of breast cancer    Family history of colon cancer    Family history of pancreatic cancer    GERD (gastroesophageal reflux disease)    Hypertension    Past Surgical History:  Procedure Laterality Date   ABDOMINAL HYSTERECTOMY  2004   CESAREAN SECTION  2003   KNEE ARTHROSCOPY Right 2013   Social History   Socioeconomic History   Marital status: Married    Spouse name: Not on file   Number of children: Not on file   Years of education: Not on file   Highest education level: Not on file  Occupational History   Not on file  Tobacco Use   Smoking status: Never   Smokeless tobacco: Never  Vaping Use   Vaping Use: Never used  Substance and Sexual Activity   Alcohol use: No   Drug use: No   Sexual activity: Yes    Birth control/protection: Surgical  Other Topics Concern   Not  on file  Social History Narrative   Works for the [employer] -- working there for 24 years   Married, 2 kids (23 and 48) and 1 grandson   Social Determinants of Radio broadcast assistant Strain: Not on file  Food Insecurity: Not on file  Transportation Needs: Not on file  Physical Activity: Not on file  Stress: Not on file  Social Connections: Not on file   No Known Allergies Family History  Problem Relation Age of Onset   Colon cancer Mother 54   Cancer Mother    Diabetes Mother    Heart disease Father        CHF   Hypertension Father    Heart attack Father 32   Stroke Father    Diabetes Sister    Breast cancer Sister 22   Congestive Heart Failure Sister    Heart attack Sister    Pancreatic cancer Sister 64   Heart failure Brother    Heart disease Brother        stent   Colon cancer Maternal Grandfather        dx >50   Heart Problems Paternal Uncle      Current Outpatient Medications (Cardiovascular):    amLODipine (NORVASC) 10 MG tablet,  Take 1 tablet (10 mg total) by mouth daily.   hydrochlorothiazide (HYDRODIURIL) 25 MG tablet, Take 1 tablet (25 mg total) by mouth daily.  Current Outpatient Medications (Respiratory):    albuterol (VENTOLIN HFA) 108 (90 Base) MCG/ACT inhaler, Inhale 2 puffs into the lungs every 6 (six) hours as needed for wheezing or shortness of breath.   montelukast (SINGULAIR) 10 MG tablet, TAKE 1 TABLET(10 MG) BY MOUTH AT BEDTIME  Current Outpatient Medications (Analgesics):    ibuprofen (ADVIL) 200 MG tablet, Take 400 mg by mouth as needed.   Current Outpatient Medications (Other):    ALPRAZolam (XANAX) 0.5 MG tablet, Take 0.5 mg by mouth 4 (four) times daily as needed.    Cholecalciferol (VITAMIN D3) 1000 units CAPS, Take 1 capsule by mouth daily.   FLUoxetine (PROZAC) 40 MG capsule, fluoxetine 40 mg capsule  TAKE 1 CAPSULE BY MOUTH EVERY DAY   Multiple Vitamin (MULTIVITAMIN) tablet, Take 1 tablet by mouth daily.   triamcinolone  (KENALOG) 0.1 %, SMARTSIG:Sparingly Topical Twice Daily   Reviewed prior external information including notes and imaging from  primary care provider As well as notes that were available from care everywhere and other healthcare systems.  Past medical history, social, surgical and family history all reviewed in electronic medical record.  No pertanent information unless stated regarding to the chief complaint.   Review of Systems:  No headache, visual changes, nausea, vomiting, diarrhea, constipation, dizziness, abdominal pain, skin rash, fevers, chills, night sweats, weight loss, swollen lymph nodes, body aches, joint swelling, chest pain, shortness of breath, mood changes. POSITIVE muscle aches  Objective  There were no vitals taken for this visit.   General: No apparent distress alert and oriented x3 mood and affect normal, dressed appropriately.  HEENT: Pupils equal, extraocular movements intact  Respiratory: Patient's speak in full sentences and does not appear short of breath  Cardiovascular: No lower extremity edema, non tender, no erythema      Impression and Recommendations:

## 2022-09-27 ENCOUNTER — Ambulatory Visit: Payer: 59 | Admitting: Family Medicine

## 2022-09-29 ENCOUNTER — Ambulatory Visit: Payer: 59

## 2022-09-29 DIAGNOSIS — G4733 Obstructive sleep apnea (adult) (pediatric): Secondary | ICD-10-CM

## 2022-09-30 DIAGNOSIS — G4733 Obstructive sleep apnea (adult) (pediatric): Secondary | ICD-10-CM

## 2022-10-05 ENCOUNTER — Ambulatory Visit: Payer: 59 | Admitting: Nurse Practitioner

## 2022-10-05 ENCOUNTER — Encounter: Payer: Self-pay | Admitting: Nurse Practitioner

## 2022-10-05 VITALS — BP 124/74 | HR 72 | Ht 65.0 in | Wt 256.5 lb

## 2022-10-05 DIAGNOSIS — Z6841 Body Mass Index (BMI) 40.0 and over, adult: Secondary | ICD-10-CM | POA: Diagnosis not present

## 2022-10-05 DIAGNOSIS — G4733 Obstructive sleep apnea (adult) (pediatric): Secondary | ICD-10-CM | POA: Diagnosis not present

## 2022-10-05 NOTE — Patient Instructions (Addendum)
Start CPAP auto 5-15 cmH2O every night, minimum of 4-6 hours a night.  Change equipment every 30 days or as directed by DME. Wash your tubing with warm soap and water daily, hang to dry. Wash humidifier portion weekly.   Be aware of reduced alertness and do not drive or operate heavy machinery if experiencing this or drowsiness.  Exercise encouraged, as tolerated. Healthy weight management discussed.  Notify if persistent daytime sleepiness occurs even with consistent use of CPAP.  We discussed how untreated sleep apnea puts an individual at risk for cardiac arrhthymias, pulm HTN, DM, stroke and increases their risk for daytime accidents. We also briefly reviewed treatment options including weight loss, side sleeping position, oral appliance, CPAP therapy or referral to ENT for possible surgical options  Follow up in 12 weeks with Dr. Ander Slade (new pt 30 min slot; 1st) or Roxan Diesel NP, or sooner if needed

## 2022-10-05 NOTE — Progress Notes (Unsigned)
$@PatientB$  ID: Aimee [name], female    DOB: 1960/10/02, 62 y.o.   MRN: [id number]  Chief Complaint  Patient presents with   Follow-up    Pt f/u to discuss HST results, she doesn't have any concerns at the moment     Referring provider: [name], PA  HPI: 62 year old female, never smoker referred for sleep consult.  Past medical history significant for hypertension, bilateral arthritis of the knee, DDD, anxiety, depression, obesity.  TEST/EVENTS:  09/29/2022 HST: AHI 12.8/h, SpO2 low 82%  08/18/2022: OV with  NP for sleep consult referred by Dr. Tamala Julian.  She has been having issues with her sleep for many years now.  She has been told that she snores very loudly and will stop breathing at night.  Wakes up in the morning with dry mouth.  She has daytime fatigue.  Wakes feeling poorly rested.  She also wakes frequently throughout the night.  She denies any morning headaches, drowsy driving, sleep parasomnia/paralysis.  No history of narcolepsy or cataplexy.  She did have some issues with tingling in her lower extremities a few months ago.  Her primary care provider started her on iron and vitamin D.  The symptoms have resolved.  Does not necessarily feel like her legs were ever restless. She goes to bed between 9 and 10 PM.  Usually falls asleep pretty quickly.  Wakes a few times a night, usually to use the restroom.  She attributes this to drinking a lot of water.  She officially gets up in the morning around 6:30 AM.  No sleep aids.  No excessive caffeine intake.  Works in [employer], [employer].  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  She is down 10 pounds over the last 2 years.  Working on weight loss and exercising more regularly.  Has never had a previous sleep study. She has a history of high blood pressure, well-controlled on antihypertensives.  No history of stroke.  She had hysterectomy 20 years ago.  She is a never smoker.  Does not drink alcohol.  Lives at home with her  husband.  She has 2 children, 20 and 40.  Past family history significant for father, brothers and sisters with heart disease; Mother and sisters with cancer. [place] 19  10/05/2022: Today - follow up Patient presents today for follow-up to discuss home sleep study results, which revealed mild obstructive sleep apnea.  She continues to have symptoms of daytime fatigue and poor sleep quality.  Overall feeling unchanged compared to when she was here last.  She would like to review treatment options for her sleep apnea.  She denies any morning headaches, drowsy driving, sleep parasomnia/paralysis.  No Known Allergies  Immunization History  Administered Date(s) Administered   Influenza Split 06/27/2005, 06/26/2013   Influenza,inj,Quad PF,6+ Mos 06/28/2016   Influenza-Unspecified 07/03/2017, 05/30/2018, 05/29/2019, 06/15/2020, 07/14/2021, 05/15/2022   Moderna Covid Bivalent Peds Booster(39moThru 596yr 05/12/2021   Moderna Sars-Covid-2 Vaccination 10/24/2019, 11/26/2019, 07/25/2020   Tdap 02/22/2008, 01/11/2018   Zoster Recombinat (Shingrix) 05/12/2021, 11/09/2021   Zoster, Unspecified 06/15/2021    Past Medical History:  Diagnosis Date   Allergy    Anxiety    Arthritis    Depression    Family history of breast cancer    Family history of colon cancer    Family history of pancreatic cancer    GERD (gastroesophageal reflux disease)    Hypertension    Obstructive sleep apnea 10/06/2022    Tobacco History: Social History  Tobacco Use  Smoking Status Never  Smokeless Tobacco Never   Counseling given: Not Answered   Outpatient Medications Prior to Visit  Medication Sig Dispense Refill   albuterol (VENTOLIN HFA) 108 (90 Base) MCG/ACT inhaler Inhale 2 puffs into the lungs every 6 (six) hours as needed for wheezing or shortness of breath. 8 g 0   ALPRAZolam (XANAX) 0.5 MG tablet Take 0.5 mg by mouth 4 (four) times daily as needed.      amLODipine (NORVASC) 10 MG tablet Take 1 tablet  (10 mg total) by mouth daily. 90 tablet 3   Cholecalciferol (VITAMIN D3) 1000 units CAPS Take 1 capsule by mouth daily.     FLUoxetine (PROZAC) 40 MG capsule fluoxetine 40 mg capsule  TAKE 1 CAPSULE BY MOUTH EVERY DAY     hydrochlorothiazide (HYDRODIURIL) 25 MG tablet Take 1 tablet (25 mg total) by mouth daily. 90 tablet 3   ibuprofen (ADVIL) 200 MG tablet Take 400 mg by mouth as needed.     montelukast (SINGULAIR) 10 MG tablet TAKE 1 TABLET(10 MG) BY MOUTH AT BEDTIME 90 tablet 1   Multiple Vitamin (MULTIVITAMIN) tablet Take 1 tablet by mouth daily.     omeprazole (PRILOSEC) 20 MG capsule Take 20 mg by mouth daily.     triamcinolone (KENALOG) 0.1 % SMARTSIG:Sparingly Topical Twice Daily     No facility-administered medications prior to visit.     Review of Systems:   Constitutional: No weight loss or gain, night sweats, fevers, chills, or lassitude. +excessive daytime fatigue  HEENT: No headaches, difficulty swallowing, tooth/dental problems, or sore throat. No sneezing, itching, ear ache, nasal congestion, or post nasal drip. +dry mouth CV:  No chest pain, orthopnea, PND, swelling in lower extremities, anasarca, dizziness, palpitations, syncope Resp: +snoring, witnessed apneas. No shortness of breath with exertion or at rest. No excess mucus or change in color of mucus. No productive or non-productive. No hemoptysis. No wheezing.  No chest wall deformity GI:  No heartburn, indigestion, abdominal pain, loss of appetite GU: +nocturia. No dysuria, change in color of urine, urgency or frequency Skin: No rash, lesions, ulcerations MSK:  No joint pain or swelling.   Neuro: No dizziness or lightheadedness.  Psych: No depression or anxiety. Mood stable. +sleep disturbance    Physical Exam:  BP 124/74   Pulse 72   Ht 5' 5"$  (1.651 m)   Wt 256 lb 8 oz (116.3 kg)   SpO2 97%   BMI 42.68 kg/m   GEN: Pleasant, interactive, well-appearing; obese; in no acute distress. HEENT:  Normocephalic  and atraumatic. PERRLA. Sclera white. Nasal turbinates pink, moist and patent bilaterally. No rhinorrhea present. Oropharynx pink and moist, without exudate or edema. No lesions, ulcerations, or postnasal drip. Mallampati II/III NECK:  Supple w/ fair ROM. No JVD present. Normal carotid impulses w/o bruits. Thyroid symmetrical with no goiter or nodules palpated. No lymphadenopathy.   CV: RRR, no m/r/g, no peripheral edema. Pulses intact, +2 bilaterally. No cyanosis, pallor or clubbing. PULMONARY:  Unlabored, regular breathing. Clear bilaterally A&P w/o wheezes/rales/rhonchi. No accessory muscle use.  GI: BS present and normoactive. Soft, non-tender to palpation. No organomegaly or masses detected.  MSK: No erythema, warmth or tenderness. Cap refil <2 sec all extrem. No deformities or joint swelling noted.  Neuro: A/Ox3. No focal deficits noted.   Skin: Warm, no lesions or rashe Psych: Normal affect and behavior. Judgement and thought content appropriate.     Lab Results:  CBC    Component Value Date/Time  WBC 5.5 11/26/2021 1133   RBC 4.65 11/26/2021 1133   HGB 11.7 (L) 11/26/2021 1133   HCT 36.9 11/26/2021 1133   PLT 264.0 11/26/2021 1133   MCV 79.5 11/26/2021 1133   MCHC 31.6 11/26/2021 1133   RDW 15.1 11/26/2021 1133   LYMPHSABS 2.0 11/26/2021 1133   MONOABS 0.5 11/26/2021 1133   EOSABS 0.1 11/26/2021 1133   BASOSABS 0.0 11/26/2021 1133    BMET    Component Value Date/Time   NA 141 12/14/2021 1221   K 3.6 12/14/2021 1221   CL 100 12/14/2021 1221   CO2 23 12/14/2021 1221   GLUCOSE 89 12/14/2021 1221   GLUCOSE 76 11/26/2021 1133   BUN 18 12/14/2021 1221   CREATININE 0.81 12/14/2021 1221   CALCIUM 9.1 12/14/2021 1221   GFRNONAA >60 01/18/2007 1400   GFRAA  01/18/2007 1400    >60        The eGFR has been calculated using the MDRD equation. This calculation has not been validated in all clinical    BNP No results found for: "BNP"   Imaging:  No results  found.        No data to display          No results found for: "NITRICOXIDE"      Assessment & Plan:   Obstructive sleep apnea Mild obstructive sleep apnea with AHI 12.8/h.  We reviewed potential treatment options today.  She would like to move forward with CPAP therapy given her daytime symptoms and significant nighttime snoring.  We will start her on auto setting of 5 to 50 cm of water and nasal mask of choice.  Educated on proper use and care of device.  Risk/benefits reviewed.  Cautioned on safe driving practices.  Patient Instructions  Start CPAP auto 5-15 cmH2O every night, minimum of 4-6 hours a night.  Change equipment every 30 days or as directed by DME. Wash your tubing with warm soap and water daily, hang to dry. Wash humidifier portion weekly.   Be aware of reduced alertness and do not drive or operate heavy machinery if experiencing this or drowsiness.  Exercise encouraged, as tolerated. Healthy weight management discussed.  Notify if persistent daytime sleepiness occurs even with consistent use of CPAP.  We discussed how untreated sleep apnea puts an individual at risk for cardiac arrhthymias, pulm HTN, DM, stroke and increases their risk for daytime accidents. We also briefly reviewed treatment options including weight loss, side sleeping position, oral appliance, CPAP therapy or referral to ENT for possible surgical options  Follow up in 12 weeks with Dr. Ander Slade (new pt 30 min slot; 1st) or Katie  NP, or sooner if needed    Obesity Healthy weight loss encouraged. She would like to work on this to see if she is able to stop CPAP in the future. If she is able to lose 20% of her current weight, can repeat HST at that point.     I spent 35 minutes of dedicated to the care of this patient on the date of this encounter to include pre-visit review of records, face-to-face time with the patient discussing conditions above, post visit ordering of testing, clinical  documentation with the electronic health record, making appropriate referrals as documented, and communicating necessary findings to members of the patients care team.  Clayton Bibles, NP 10/06/2022  Pt aware and understands NP's role.

## 2022-10-06 ENCOUNTER — Encounter: Payer: Self-pay | Admitting: Nurse Practitioner

## 2022-10-06 DIAGNOSIS — G4733 Obstructive sleep apnea (adult) (pediatric): Secondary | ICD-10-CM

## 2022-10-06 HISTORY — DX: Obstructive sleep apnea (adult) (pediatric): G47.33

## 2022-10-06 NOTE — Assessment & Plan Note (Signed)
Healthy weight loss encouraged. She would like to work on this to see if she is able to stop CPAP in the future. If she is able to lose 20% of her current weight, can repeat HST at that point.

## 2022-10-06 NOTE — Assessment & Plan Note (Signed)
Mild obstructive sleep apnea with AHI 12.8/h.  We reviewed potential treatment options today.  She would like to move forward with CPAP therapy given her daytime symptoms and significant nighttime snoring.  We will start her on auto setting of 5 to 50 cm of water and nasal mask of choice.  Educated on proper use and care of device.  Risk/benefits reviewed.  Cautioned on safe driving practices.  Patient Instructions  Start CPAP auto 5-15 cmH2O every night, minimum of 4-6 hours a night.  Change equipment every 30 days or as directed by DME. Wash your tubing with warm soap and water daily, hang to dry. Wash humidifier portion weekly.   Be aware of reduced alertness and do not drive or operate heavy machinery if experiencing this or drowsiness.  Exercise encouraged, as tolerated. Healthy weight management discussed.  Notify if persistent daytime sleepiness occurs even with consistent use of CPAP.  We discussed how untreated sleep apnea puts an individual at risk for cardiac arrhthymias, pulm HTN, DM, stroke and increases their risk for daytime accidents. We also briefly reviewed treatment options including weight loss, side sleeping position, oral appliance, CPAP therapy or referral to ENT for possible surgical options  Follow up in 12 weeks with Dr. Ander Slade (new pt 30 min slot; 1st) or Roxan Diesel NP, or sooner if needed

## 2022-10-10 ENCOUNTER — Encounter (INDEPENDENT_AMBULATORY_CARE_PROVIDER_SITE_OTHER): Payer: 59 | Admitting: Family Medicine

## 2022-10-10 DIAGNOSIS — Z0289 Encounter for other administrative examinations: Secondary | ICD-10-CM

## 2022-11-01 ENCOUNTER — Encounter (INDEPENDENT_AMBULATORY_CARE_PROVIDER_SITE_OTHER): Payer: Self-pay | Admitting: Family Medicine

## 2022-11-01 ENCOUNTER — Ambulatory Visit (INDEPENDENT_AMBULATORY_CARE_PROVIDER_SITE_OTHER): Payer: 59 | Admitting: Family Medicine

## 2022-11-01 VITALS — BP 145/99 | HR 76 | Temp 98.0°F | Ht 65.0 in | Wt 251.8 lb

## 2022-11-01 DIAGNOSIS — E559 Vitamin D deficiency, unspecified: Secondary | ICD-10-CM | POA: Diagnosis not present

## 2022-11-01 DIAGNOSIS — I10 Essential (primary) hypertension: Secondary | ICD-10-CM

## 2022-11-01 DIAGNOSIS — R0602 Shortness of breath: Secondary | ICD-10-CM | POA: Diagnosis not present

## 2022-11-01 DIAGNOSIS — R5383 Other fatigue: Secondary | ICD-10-CM | POA: Diagnosis not present

## 2022-11-01 DIAGNOSIS — F39 Unspecified mood [affective] disorder: Secondary | ICD-10-CM | POA: Diagnosis not present

## 2022-11-01 DIAGNOSIS — Z6841 Body Mass Index (BMI) 40.0 and over, adult: Secondary | ICD-10-CM

## 2022-11-01 DIAGNOSIS — G4733 Obstructive sleep apnea (adult) (pediatric): Secondary | ICD-10-CM | POA: Diagnosis not present

## 2022-11-01 NOTE — Progress Notes (Signed)
[name], D.O.  ABFM, ABOM Specializing in Clinical Bariatric Medicine  Office located at: [address], [place]  [address]     New Patient Note:   Dear Aimee Coke, PA,   Thank you for referring Aimee [name] to our clinic today for evaluation.  We performed a consultation to discuss her options for treatment and educate the patient on her disease state.  The following note includes my evaluation and treatment recommendations.   Please do not hesitate to reach out to me directly if you have any further concerns.   Assessment and Plan:   Orders Placed This Encounter  Procedures   CBC with Differential/Platelet   Comprehensive metabolic panel   Hemoglobin A1c   Insulin, random   Lipid Panel With LDL/HDL Ratio   T4, free   TSH   Vitamin B12   VITAMIN D 25 Hydroxy (Vit-D Deficiency, Fractures)   EKG 12-Lead    There are no discontinued medications.   No orders of the defined types were placed in this encounter.  BMI 40.0-44.9, adult (HCC)-current bmi 41.9 Morbid obesity (HCC)-start bmi 41.9/date 10/31/21 Assessment: Condition is Not at goal.. Biometric data collected today, was reviewed with patient.  Fat mass is 122.8lbs. Muscle mass is122.4lbs  Total body water is 88.4lbs.    Plan: Patient agreed to Category 2 meal plan with breakfast and lunch options.  Education regarding management of these chronic disease states was given. Management strategies discussed on successive visits include dietary and exercise recommendations, goals of achieving and maintaining BMI < 30, and lifestyle modifications aiming for adequate sleep, exercise and minimizing stressors.    Hypertension, unspecified type  Assessment: Condition is Not optimized.. Labs were reviewed.  Last 3 blood pressure readings in our office are as follows: BP Readings from Last 3 Encounters:  11/01/22 (!) 145/99  10/05/22 124/74  08/18/22 122/78  She has a fhx of blood pressure  and has had high bp for several years. Her bp is  regulary  in the 130/80s, but when her knee pain is elevated her bp increases. She wants to get surgery on her knee after losing weight. Patient is complaint with Hydrodiuril  25 mg daily  and Norvasc 10 mg daily, denies any side effects.   Plan: She will continue with Hydrodiuril  25 mg daily  and Norvasc 10 mg daily as recommended by specialist.  [name] on pathophysiology of disease and discussed treatment plan, which always includes dietary and lifestyle modification as first line.  Lifestyle changes such as following our low salt, heart healthy meal plan and engaging in a regular exercise program discussed    Mood Disorder: Emotional Eating Assessment: Condition is Controlled.. Denies any SI/HI. Mood is stable. Patient is compliant with Prozac 40 mg daily and Xanaax 0.5mg  (as needed).   Plan: Patient will consider meeting Dr.Barker, our Bariatric Psychologist, but does not wish to currently.    OSA on CPAP Assessment: Condition is At goal..  She did a sleep study in February 2024 with Dr.Sood. She was diagnosed with mild obstructive sleep apnea and had an AHI of 12.8 per hour. She is on nasal CPAP Therapy  Plan: Continue with OSA treatment as recommended by Dr.Sood. I discussed the importance of getting 7-9 hours per sleep for weight loss.    SOB (shortness of breath) on exertion Assessment: Condition is Not optimized.[name] does feel that she gets out of breath more easily that she used to  when she exercises.   Stacia's shortness of breath appears to be obesity related and exercise induced. Also, it appears to be related to a state of poor cardiovascular conditioning as they do not appear to have any red flag symptoms today  Plan: Patient agreed to work on weight loss and gradually increase exercise as tolerated to treat her current condition   If [name] follows our recommendations and loses 5-10% of their weight  without improvement of her shortness of breath or if at any time, pt is losing weight but symptoms become more concerning, they agree to urgently follow up with their PCP/specilist for further consideration kataleia whitler agreement with this plan.    Other fatigue Assessment: Condition is Not optimized.[name] does feel that her weight is causing her energy to be lower than it should be. Fatigue may be related to obesity, depression or many other causes.   Plan: Labs will be ordered, and in the meanwhile, [name] will focus on self care including making healthy food choices, increasing physical activity and focusing on stress reduction.    Vitamin D Deficiency  Assessment: Condition is Not at goal.. Labs were reviewed.  Lab Results  Component Value Date   VD25OH 34.21 11/26/2021   VD25OH 28.99 (L) 11/09/2020   VD25OH 28.67 (L) 11/06/2019  She has been complaint with Vitamin D 1000 IU daily. Denies any side effects.   Plan: - I discussed the importance of vitamin D to the patient's health and well-being.  - weight loss will likely improve availability of vitamin D, thus encouraged [name] to continue with meal plan and their weight loss efforts to further improve this condition.  Thus, we will need to monitor levels regularly (every 3-4 mo on average) to keep levels within normal limits and prevent over supplementation.   TREATMENT PLAN FOR OBESITY: Recommended Dietary Goals [name] is currently in the action stage of change. As such, her goal is to continue weight management plan. She has agreed to the Category 2 Plan with breakfast and lunch options  Behavioral Intervention Additional resources provided today: Category 2 Plan with breakfast and lunch options, Category 2 grocery list, air fryer recipe.  Evidence-based interventions for health behavior change were utilized today including the discussion of self monitoring techniques, problem-solving barriers and SMART goal setting  techniques.   Regarding patient's less desirable eating habits and patterns, we employed the technique of small changes.  Pt will specifically work on: following the Category 2 plan breakfast and lunch options for next visit.    Recommended Physical Activity Goals Eline has been advised to work up to 150 minutes of moderate intensity aerobic activity a week and strengthening exercises 2-3 times per week for cardiovascular health, weight loss maintenance and preservation of muscle mass.  She has agreed to Continue current level of physical activity   FOLLOW UP: Follow up in 2 weeks. She was informed of the importance of frequent follow up visits to maximize her success with intensive lifestyle modifications for her multiple health conditions. She is aware that we will review all of her lab results at next visit, and she is aware that anything is life threatening we will be contact her prior to visit to discuss results.  Chief Complaint:   Grayson (MR# [id number]) is a 62 y.o. female who presents for evaluation and treatment of obesity and related comorbidities. Current BMI is Body mass index is 41.9 kg/m. Yer has been struggling with her weight for many years and  has been unsuccessful in either losing weight, maintaining weight loss, or reaching her healthy weight goal.  KAYTE DEMASTUS works in Engineer, mining for [employer]. Patient is married to [place]  and does not have children.Her current weight is 256lbs and her goal is to be 200lbs. She has lost 50lbs in the past by using weight watchers.  She eats out 7 times per week (fast or take out 6x per week).   She typically eats out for lunch and dinner, however in the last two weeks she has been eating at home. She snacks on chips and her worst food habit is fried foods. . She states she eats when stressed and that finicnacial problems are effecting her ability to eat healthy. She tends to drink 108oz of water per day. She  exercises 3-5 days per week.   Depression Screen Her Food and Mood (modified PHQ-9) score was 10.  Sleep habits Her ESS score is 13.   Subjective:   This is the patient's first visit at [facility].  The patient's NEW PATIENT PACKET that they filled out prior to today's office visit was reviewed at length and information from that paperwork was included within the following office visit note.    Included in the packet: current and past health history, medications, allergies, ROS, gynecologic history (women only), surgical history, family history, social history, weight history, weight loss surgery history (for those that have had weight loss surgery), nutritional evaluation, mood and food questionnaire along with a depression screening (PHQ9) on all patients, an Epworth questionnaire, sleep habits questionnaire, patient life and health improvement goals questionnaire. These will all be scanned into the patient's chart under media.   Review of Systems: Please refer to new patient packet scanned into media. Pertinent positives were addressed with patient today.   Objective:   PHYSICAL EXAM: Blood pressure (!) 145/99, pulse 76, temperature 98 F (36.7 C), height 5\' 5"  (1.651 m), weight 251 lb 12.8 oz (114.2 kg), SpO2 (!) 76 %. Body mass index is 41.9 kg/m. General: Well Developed, well nourished, and in no acute distress.  HEENT: Normocephalic, atraumatic Skin: Warm and dry, cap RF less 2 sec, good turgor Chest:  Normal excursion, shape, no gross abn Respiratory: speaking in full sentences, no conversational dyspnea NeuroM-Sk: Ambulates w/o assistance, moves * 4 Psych: A and O *3, insight good, mood-full  EKG: Normal sinus rhythm, rate 77 bpm. No acute changes   Indirect Calorimeter completed today shows a VO2 of 268 and REE 1843. Her calculated basal metabolic rate is [id number] thus her measured basal metabolic rate  is slightly better  than expected.  Anthropometric  Measurements Height: 5\' 5"  (1.651 m) Weight: 251 lb 12.8 oz (114.2 kg) BMI (Calculated): 41.9 Starting Weight: 251.8lb Peak Weight: 270lb Waist Measurement : 45 inches   Body Composition  Body Fat %: 48.8 % Fat Mass (lbs): 122.8 lbs Muscle Mass (lbs): 122.4 lbs Total Body Water (lbs): 88.4 lbs Visceral Fat Rating : 16   Other Clinical Data A1c: 5.3 mg/dL (01/04/2021) RMR: [id number] Fasting: yes Labs: yes Today's Visit #: 1 Starting Date: 11/01/22    DIAGNOSTIC DATA REVIEWED:  BMET    Component Value Date/Time   NA 141 12/14/2021 1221   K 3.6 12/14/2021 1221   CL 100 12/14/2021 1221   CO2 23 12/14/2021 1221   GLUCOSE 89 12/14/2021 1221   GLUCOSE 76 11/26/2021 1133   BUN 18 12/14/2021 1221   CREATININE 0.81 12/14/2021 1221  CALCIUM 9.1 12/14/2021 1221   GFRNONAA >60 01/18/2007 1400   GFRAA  01/18/2007 1400    >60        The eGFR has been calculated using the MDRD equation. This calculation has not been validated in all clinical   Lab Results  Component Value Date   HGBA1C 5.3 11/09/2020   HGBA1C 5.4 10/10/2018   No results found for: "INSULIN" Lab Results  Component Value Date   TSH 1.11 07/16/2018   CBC    Component Value Date/Time   WBC 5.5 11/26/2021 1133   RBC 4.65 11/26/2021 1133   HGB 11.7 (L) 11/26/2021 1133   HCT 36.9 11/26/2021 1133   PLT 264.0 11/26/2021 1133   MCV 79.5 11/26/2021 1133   MCHC 31.6 11/26/2021 1133   RDW 15.1 11/26/2021 1133   Iron Studies No results found for: "IRON", "TIBC", "FERRITIN", "IRONPCTSAT" Lipid Panel     Component Value Date/Time   CHOL 158 11/26/2021 1133   TRIG 52.0 11/26/2021 1133   HDL 51.20 11/26/2021 1133   CHOLHDL 3 11/26/2021 1133   VLDL 10.4 11/26/2021 1133   LDLCALC 96 11/26/2021 1133   Hepatic Function Panel     Component Value Date/Time   PROT 7.1 11/26/2021 1133   ALBUMIN 4.0 11/26/2021 1133   AST 15 11/26/2021 1133   ALT 14 11/26/2021 1133   ALKPHOS 65 11/26/2021 1133   BILITOT 0.5  11/26/2021 1133      Component Value Date/Time   TSH 1.11 07/16/2018 0910   Nutritional Lab Results  Component Value Date   VD25OH 34.21 11/26/2021   VD25OH 28.99 (L) 11/09/2020   VD25OH 28.67 (L) 11/06/2019    Attestation Statements:   Reviewed by clinician on day of visit: allergies, medications, problem list, medical history, surgical history, family history, social history, and previous encounter notes.  During the visit, I independently reviewed the patient's EKG, bioimpedance scale results, and indirect calorimeter results. I used this information to tailor a meal plan for the patient that will help [name] to lose weight and will improve her obesity-related conditions going forward.  I performed a medically necessary appropriate examination and/or evaluation. I discussed the assessment and treatment plan with the patient. The patient was provided an opportunity to ask questions and all were answered. The patient agreed with the plan and demonstrated an understanding of the instructions. Labs were ordered today (unless patient declined them) and will be reviewed with the patient at our next visit unless more critical results need to be addressed immediately. Clinical information was updated and documented in the EMR.  Time spent on visit including pre-visit chart review and post-visit care was estimated to be 60 *** minutes.  A separate 15 minutes was spent on risk counseling (see above/below).    I,Special Puri,acting as a Education administrator for [employer], DO.,have documented all relevant documentation on the behalf of Mellody Dance, DO,as directed by  Mellody Dance, DO while in the presence of Mellody Dance, DO.   I, Mellody Dance, DO, have reviewed all documentation for this visit. The documentation on 11/01/22 for the exam, diagnosis, procedures, and orders are all accurate and complete.

## 2022-11-02 LAB — T4, FREE: Free T4: 1.35 ng/dL (ref 0.82–1.77)

## 2022-11-02 LAB — CBC WITH DIFFERENTIAL/PLATELET
Basophils Absolute: 0 10*3/uL (ref 0.0–0.2)
Basos: 0 %
EOS (ABSOLUTE): 0.2 10*3/uL (ref 0.0–0.4)
Eos: 3 %
Hematocrit: 38.2 % (ref 34.0–46.6)
Hemoglobin: 11.5 g/dL (ref 11.1–15.9)
Immature Grans (Abs): 0 10*3/uL (ref 0.0–0.1)
Immature Granulocytes: 1 %
Lymphocytes Absolute: 1.9 10*3/uL (ref 0.7–3.1)
Lymphs: 31 %
MCH: 24.9 pg — ABNORMAL LOW (ref 26.6–33.0)
MCHC: 30.1 g/dL — ABNORMAL LOW (ref 31.5–35.7)
MCV: 83 fL (ref 79–97)
Monocytes Absolute: 0.5 10*3/uL (ref 0.1–0.9)
Monocytes: 8 %
Neutrophils Absolute: 3.6 10*3/uL (ref 1.4–7.0)
Neutrophils: 57 %
Platelets: 298 10*3/uL (ref 150–450)
RBC: 4.61 x10E6/uL (ref 3.77–5.28)
RDW: 13.8 % (ref 11.7–15.4)
WBC: 6.2 10*3/uL (ref 3.4–10.8)

## 2022-11-02 LAB — COMPREHENSIVE METABOLIC PANEL
ALT: 15 IU/L (ref 0–32)
AST: 14 IU/L (ref 0–40)
Albumin/Globulin Ratio: 1.4 (ref 1.2–2.2)
Albumin: 4.2 g/dL (ref 3.8–4.9)
Alkaline Phosphatase: 74 IU/L (ref 44–121)
BUN/Creatinine Ratio: 24 (ref 12–28)
BUN: 19 mg/dL (ref 8–27)
Bilirubin Total: 0.3 mg/dL (ref 0.0–1.2)
CO2: 27 mmol/L (ref 20–29)
Calcium: 9.2 mg/dL (ref 8.7–10.3)
Chloride: 102 mmol/L (ref 96–106)
Creatinine, Ser: 0.8 mg/dL (ref 0.57–1.00)
Globulin, Total: 3 g/dL (ref 1.5–4.5)
Glucose: 88 mg/dL (ref 70–99)
Potassium: 4.1 mmol/L (ref 3.5–5.2)
Sodium: 144 mmol/L (ref 134–144)
Total Protein: 7.2 g/dL (ref 6.0–8.5)
eGFR: 84 mL/min/{1.73_m2} (ref >59)

## 2022-11-02 LAB — LIPID PANEL WITH LDL/HDL RATIO
Cholesterol, Total: 152 mg/dL (ref 100–199)
HDL: 49 mg/dL (ref >39)
LDL Chol Calc (NIH): 91 mg/dL (ref 0–99)
LDL/HDL Ratio: 1.9 ratio (ref 0.0–3.2)
Triglycerides: 59 mg/dL (ref 0–149)
VLDL Cholesterol Cal: 12 mg/dL (ref 5–40)

## 2022-11-02 LAB — HEMOGLOBIN A1C
Est. average glucose Bld gHb Est-mCnc: 111 mg/dL
Hgb A1c MFr Bld: 5.5 % (ref 4.8–5.6)

## 2022-11-02 LAB — INSULIN, RANDOM: INSULIN: 18.4 u[IU]/mL (ref 2.6–24.9)

## 2022-11-02 LAB — TSH: TSH: 1.54 u[IU]/mL (ref 0.450–4.500)

## 2022-11-02 LAB — VITAMIN B12: Vitamin B-12: 558 pg/mL (ref 232–1245)

## 2022-11-02 LAB — VITAMIN D 25 HYDROXY (VIT D DEFICIENCY, FRACTURES): Vit D, 25-Hydroxy: 48.4 ng/mL (ref 30.0–100.0)

## 2022-11-03 ENCOUNTER — Encounter (INDEPENDENT_AMBULATORY_CARE_PROVIDER_SITE_OTHER): Payer: Self-pay | Admitting: Family Medicine

## 2022-11-09 ENCOUNTER — Other Ambulatory Visit: Payer: Self-pay | Admitting: Physician Assistant

## 2022-11-09 MED ORDER — ALBUTEROL SULFATE HFA 108 (90 BASE) MCG/ACT IN AERS: 2.0000 | INHALATION_SPRAY | Freq: Four times a day (QID) | RESPIRATORY_TRACT | 0 refills | Status: DC | PRN

## 2022-11-09 NOTE — Telephone Encounter (Signed)
Spoke to pt told her pharmacy does have her Albuterol inhaler ready for her. Pt verbalized understanding.

## 2022-11-09 NOTE — Telephone Encounter (Signed)
Patient requesting we contact pharmacy , states they have not received this prescription .

## 2022-11-15 ENCOUNTER — Encounter (INDEPENDENT_AMBULATORY_CARE_PROVIDER_SITE_OTHER): Payer: Self-pay | Admitting: Family Medicine

## 2022-11-15 ENCOUNTER — Telehealth: Payer: Self-pay | Admitting: *Deleted

## 2022-11-15 ENCOUNTER — Ambulatory Visit (INDEPENDENT_AMBULATORY_CARE_PROVIDER_SITE_OTHER): Payer: 59 | Admitting: Family Medicine

## 2022-11-15 ENCOUNTER — Encounter: Payer: Self-pay | Admitting: Physician Assistant

## 2022-11-15 VITALS — BP 136/86 | HR 82 | Temp 97.8°F | Ht 65.0 in | Wt 244.6 lb

## 2022-11-15 DIAGNOSIS — E88819 Insulin resistance, unspecified: Secondary | ICD-10-CM | POA: Insufficient documentation

## 2022-11-15 DIAGNOSIS — I10 Essential (primary) hypertension: Secondary | ICD-10-CM | POA: Diagnosis not present

## 2022-11-15 DIAGNOSIS — E559 Vitamin D deficiency, unspecified: Secondary | ICD-10-CM

## 2022-11-15 DIAGNOSIS — Z6841 Body Mass Index (BMI) 40.0 and over, adult: Secondary | ICD-10-CM

## 2022-11-15 MED ORDER — VITAMIN D3 25 MCG (1000 UT) PO CAPS
1000.0000 [IU] | ORAL_CAPSULE | Freq: Every day | ORAL | Status: AC
Start: 2022-11-15 — End: ?

## 2022-11-15 NOTE — Progress Notes (Signed)
[name], D.O.  ABFM, ABOM Specializing in Clinical Bariatric Medicine  Office located at: [address] [place], [place]  [address]     Assessment and Plan:    Medications Discontinued During This Encounter  Medication Reason   Cholecalciferol (VITAMIN D3) 1000 units CAPS Reorder     Meds ordered this encounter  Medications   Cholecalciferol (VITAMIN D3) 25 MCG (1000 UT) capsule    Sig: Take 1 capsule (1,000 Units total) by mouth daily.     Hypertension, unspecified type Assessment: Condition is Improving, but not optimized. Labs were reviewed from 11/01/22- see below.  Last 3 blood pressure readings in our office are as follows: BP Readings from Last 3 Encounters:  11/15/22 136/86  11/01/22 (!) 145/99  10/05/22 124/74   Lab Results  Component Value Date   CREATININE 0.80 11/01/2022   BUN 19 11/01/2022   NA 144 11/01/2022   K 4.1 11/01/2022   CL 102 11/01/2022   CO2 27 11/01/2022      Component Value Date/Time   PROT 7.2 11/01/2022 0848   ALBUMIN 4.2 11/01/2022 0848   AST 14 11/01/2022 0848   ALT 15 11/01/2022 0848   ALKPHOS 74 11/01/2022 0848   BILITOT 0.3 11/01/2022 0848   Lab Results  Component Value Date   CHOL 152 11/01/2022   HDL 49 11/01/2022   LDLCALC 91 11/01/2022   TRIG 59 11/01/2022   CHOLHDL 3 11/26/2021   Lab Results  Component Value Date   WBC 6.2 11/01/2022   HGB 11.5 11/01/2022   HCT 38.2 11/01/2022   MCV 83 11/01/2022   PLT 298 11/01/2022   Lab Results  Component Value Date   TSH 1.540 11/01/2022   FREET4 1.35 11/01/2022  She has been compliant with Hydrodiuril 25 mg daily and Norvasc 10 mg daily. Denies any side effects. No concerns. Asymptomatic.    Plan: Continue with meds as recommended by specialist. -Continue to monitor bp at home because her bp is may go down as she loses weight. -Continue with Prudent nutritional plan and low sodium diet, advance exercise as tolerated.   -I stressed the importance that  patient continue with our prudent nutritional plan that is low in saturated and trans fats, and low in fatty carbs to improve these numbers.  - I emphasized the importance of drinking half her body weight in ounces of water every day especially with her exercise. Increasing her water intake may also help decreasing her sodium levels.   Insulin Resistance - New Diagnosis Assessment: Condition is Not optimized. Lab Results  Component Value Date   HGBA1C 5.5 11/01/2022   HGBA1C 5.3 11/09/2020   HGBA1C 5.5 11/06/2019   INSULIN 18.4 11/01/2022  She is currently not on any medication for Insulin-Resistance. This is diet controlled.  Plan:Continue her prudent nutritional plan and continue to advance exercise and cardiovascular fitness as tolerated.  - Continue to decrease simple carbs/ sugars; increase fiber and proteins -> follow her meal plan.   - Handouts provided at pt's request after education provided.  All concerns/questions addressed.     Vitamin D deficiency Assessment: Condition is Improving, but not optimized. Labs were reviewed.  Lab Results  Component Value Date   VD25OH 48.4 11/01/2022   VD25OH 34.21 11/26/2021   VD25OH 28.99 (L) 11/09/2020  Patient is compliant with OTC Vitamin D3 1000 units daily. Denies any side effects.  Plan:Will refill Vitamin D3 today.  Continue with OTC Vitamin D3 1000 units daily -  weight loss will likely improve availability of vitamin D, thus encouraged Retta to continue with meal plan and their weight loss efforts to further improve this condition.  Thus, we will need to monitor levels regularly (every 3-4 mo on average) to keep levels within normal limits and prevent over supplementation.   TREATMENT PLAN FOR OBESITY: BMI 40.0-44.9, adult (HCC)-current bmi 40.7 Morbid obesity (HCC)-start bmi 41.9/date 10/31/21 Assessment: Condition is Improving, but not optimized. Biometric data collected today, was reviewed with patient.  Fat mass has  decreased by 5.2lb. Muscle mass has decreased by 1.8lb. Total body water has increased by 2.2lb. She endorses feeling hungry since the last office visit  Plan: Continue with the Category 2 plan with lunch options and Increase protein to 8-10 oz at dinner and 4-6 oz at lunch.  Counseling done on:  Increasing green leafy veggies (kale, spinach, lettuce) because it is low calorie and high in fiber.   Weighing protein for meals   Behavioral Intervention Additional resources provided today: category 2 meal plan information, lunch options, insulin resistance handout Evidence-based interventions for health behavior change were utilized today including the discussion of self monitoring techniques, problem-solving barriers and SMART goal setting techniques.   Regarding patient's less desirable eating habits and patterns, we employed the technique of small changes.  Pt will specifically work on: continuing to follow Category 2 plan with lunch options and increasing protein to 8-10 oz at dinner and 4-6 oz at lunch for next visit.    Recommended Physical Activity Goals Jatoria has been advised to work up to 150 minutes of moderate intensity aerobic activity a week and strengthening exercises 2-3 times per week for cardiovascular health, weight loss maintenance and preservation of muscle mass.  She has agreed to Continue current level of physical activity   FOLLOW UP: Return in about 2 weeks (around 11/29/2022). She was informed of the importance of frequent follow up visits to maximize her success with intensive lifestyle modifications for her multiple health conditions.  Subjective:   Chief complaint: Obesity Naylani is here to discuss her progress with her obesity treatment plan. She is on the the Category 2 Plan and states she is following her eating plan approximately 100 % of the time. She states she is exercising 60 minutes 4-5 days per week.  Interval History:  EMMALOU KETCHAM is here today for her  first follow-up office visit since starting the program with [place]. Since the last office she felt hungry at times. However, whenever she increased her protein intake, the hunger and cravings decreased. She endorses that she goes over her 200 snack calories. She drinks around 64 oz of water daily.   All blood work/ lab tests that were recently ordered by myself or an outside provider were reviewed with patient today per their request. Extended time was spent counseling her on all new disease processes that were discovered or preexisting ones that are affected by BMI.  she understands that many of these abnormalities will need to monitored regularly along with the current treatment plan of prudent dietary changes, in which we are making each and every office visit, to improve these health parameters.   Review of Systems:  Pertinent positives were addressed with patient today.  Weight Summary and Biometrics   Weight Lost Since Last Visit: 7lb  No data recorded  Vitals Temp: 97.8 F (36.6 C) BP: 136/86 Pulse Rate: 82 SpO2: 98 %   Anthropometric Measurements Height: 5\' 5"  (1.651 m) Weight: 244 lb 9.6 oz (  110.9 kg) BMI (Calculated): 40.7 Weight at Last Visit: 251lb Weight Lost Since Last Visit: 7lb Starting Weight: 251.8lb Total Weight Loss (lbs): 7 lb (3.175 kg) Peak Weight: 270lb   Body Composition  Body Fat %: 48.1 % Fat Mass (lbs): 117.6 lbs Muscle Mass (lbs): 120.6 lbs Total Body Water (lbs): 90.6 lbs Visceral Fat Rating : 16   Other Clinical Data Fasting: no Labs: no Today's Visit #: 2 Starting Date: 11/01/22    Objective:   PHYSICAL EXAM:  Blood pressure 136/86, pulse 82, temperature 97.8 F (36.6 C), height 5\' 5"  (1.651 m), weight 244 lb 9.6 oz (110.9 kg), SpO2 98 %. Body mass index is 40.7 kg/m.  General: Well Developed, well nourished, and in no acute distress.  HEENT: Normocephalic, atraumatic Skin: Warm and dry, cap RF less 2 sec, good turgor Chest:   Normal excursion, shape, no gross abn Respiratory: speaking in full sentences, no conversational dyspnea NeuroM-Sk: Ambulates w/o assistance, moves * 4 Psych: A and O *3, insight good, mood-full  DIAGNOSTIC DATA REVIEWED:  BMET    Component Value Date/Time   NA 144 11/01/2022 0848   K 4.1 11/01/2022 0848   CL 102 11/01/2022 0848   CO2 27 11/01/2022 0848   GLUCOSE 88 11/01/2022 0848   GLUCOSE 76 11/26/2021 1133   BUN 19 11/01/2022 0848   CREATININE 0.80 11/01/2022 0848   CALCIUM 9.2 11/01/2022 0848   GFRNONAA >60 01/18/2007 1400   GFRAA  01/18/2007 1400    >60        The eGFR has been calculated using the MDRD equation. This calculation has not been validated in all clinical   Lab Results  Component Value Date   HGBA1C 5.5 11/01/2022   HGBA1C 5.4 10/10/2018   Lab Results  Component Value Date   INSULIN 18.4 11/01/2022   Lab Results  Component Value Date   TSH 1.540 11/01/2022   CBC    Component Value Date/Time   WBC 6.2 11/01/2022 0848   WBC 5.5 11/26/2021 1133   RBC 4.61 11/01/2022 0848   RBC 4.65 11/26/2021 1133   HGB 11.5 11/01/2022 0848   HCT 38.2 11/01/2022 0848   PLT 298 11/01/2022 0848   MCV 83 11/01/2022 0848   MCH 24.9 (L) 11/01/2022 0848   MCHC 30.1 (L) 11/01/2022 0848   MCHC 31.6 11/26/2021 1133   RDW 13.8 11/01/2022 0848   Iron Studies No results found for: "IRON", "TIBC", "FERRITIN", "IRONPCTSAT" Lipid Panel     Component Value Date/Time   CHOL 152 11/01/2022 0848   TRIG 59 11/01/2022 0848   HDL 49 11/01/2022 0848   CHOLHDL 3 11/26/2021 1133   VLDL 10.4 11/26/2021 1133   LDLCALC 91 11/01/2022 0848   Hepatic Function Panel     Component Value Date/Time   PROT 7.2 11/01/2022 0848   ALBUMIN 4.2 11/01/2022 0848   AST 14 11/01/2022 0848   ALT 15 11/01/2022 0848   ALKPHOS 74 11/01/2022 0848   BILITOT 0.3 11/01/2022 0848      Component Value Date/Time   TSH 1.540 11/01/2022 0848   Nutritional Lab Results  Component Value Date    VD25OH 48.4 11/01/2022   VD25OH 34.21 11/26/2021   VD25OH 28.99 (L) 11/09/2020    Attestations:   Reviewed by clinician on day of visit: allergies, medications, problem list, medical history, surgical history, family history, social history, and previous encounter notes.   Patient was in the office today and time spent on visit including pre-visit chart review  and post-visit care/coordination of care and electronic medical record documentation was 50 minutes. 50% of the time was in face to face counseling of this patient's medical condition(s) and providing education on treatment options to include the first-line treatment of diet and lifestyle modification.   I,Special Puri,acting as a Neurosurgeon for [employer], DO.,have documented all relevant documentation on the behalf of Thomasene Lot, DO,as directed by  Thomasene Lot, DO while in the presence of Thomasene Lot, DO.   I, Thomasene Lot, DO, have reviewed all documentation for this visit. The documentation on 11/15/22 for the exam, diagnosis, procedures, and orders are all accurate and complete.

## 2022-11-15 NOTE — Telephone Encounter (Signed)
Please call patient to schedule an appt to evaluate her cough per [facility].

## 2022-11-15 NOTE — Telephone Encounter (Signed)
-----   Message from [place], Generic sent at 11/15/2022 11:19 AM EDT ----- Regarding: Your message may not be read Contact: [phone]    ----- Delivery failure of internet email alert  Tickler type: Message Message Id(WMG): [id number] SMTP Response: [id number] Patient: Aimee [name], Aimee [name]) Internet alert email: [email or web address]     ----- Original WMG message to the patient ----- Sent: 11/15/2022 11:19 AM From: Marian Sorrow, LPN To: [name] Message Type: Patient Medical Advice Request Subject: Dry cough Ms. [name],      Hello, please call the office to schedule an appointment to be evaluated for your cough per [facility].  Anselmo Pickler, LPN   ----- Message -----      From:Seferina R [name]      Sent:11/15/2022  9:16 AM EDT        UN:[id number] Morene Rankins, PA   Subject:Dry cough  Hey Dr. Morene Rankins good morning I am still having that dry tickle cough in my throat. I take my inhaler take my allergy pills and this cough is really getting into me. Is there anything else we can do to stop the cough? Like an antibiotics or something to stop the cough. It keeps returning it. It will go away, and then it will come back. [name] Thanks

## 2022-11-16 ENCOUNTER — Ambulatory Visit: Payer: 59 | Admitting: Physician Assistant

## 2022-11-16 ENCOUNTER — Encounter: Payer: Self-pay | Admitting: Physician Assistant

## 2022-11-16 VITALS — BP 132/80 | HR 72 | Temp 97.7°F | Ht 65.0 in | Wt 247.0 lb

## 2022-11-16 DIAGNOSIS — R053 Chronic cough: Secondary | ICD-10-CM | POA: Diagnosis not present

## 2022-11-16 MED ORDER — METHYLPREDNISOLONE ACETATE 40 MG/ML IJ SUSP
40.0000 mg | Freq: Once | INTRAMUSCULAR | Status: AC
Start: 2022-11-16 — End: 2022-11-16
  Administered 2022-11-16: 40 mg via INTRAMUSCULAR

## 2022-11-16 MED ORDER — QVAR REDIHALER 40 MCG/ACT IN AERB: 1.0000 | INHALATION_SPRAY | Freq: Two times a day (BID) | RESPIRATORY_TRACT | 1 refills | Status: AC

## 2022-11-16 NOTE — Addendum Note (Signed)
Addended by: Marian Sorrow on: 11/16/2022 10:34 AM   Modules accepted: Orders

## 2022-11-16 NOTE — Progress Notes (Signed)
Aimee [name] is a 62 y.o. female here for a follow-up regarding persistent cough.  History of Present Illness:   Chief Complaint  Patient presents with   Cough    Pt c/o of cough off and on since Dec., nasal congestion, clear to white nasal drainage.    HPI  Cough She has been experiencing a dry cough since 11/23. Cough started before starting CPAP. Compliant with omeprazole, Singulair. Was taking an antihistamine a few weeks ago but not currently. Took it once. Not using nasal spray -- has one at home and not sure what it is. She has albuterol inhaler that she is using around the clock q 4-6 hours and this is the only thing providing her with relief. Can feel phlegm in throat but not able to expel it. Denies long-term secondhand smoke exposure, unintentional weight changes, hemoptysis.  Past Medical History:  Diagnosis Date   Allergy    Anxiety    Arthritis    Depression    Family history of breast cancer    Family history of colon cancer    Family history of pancreatic cancer    GERD (gastroesophageal reflux disease)    Hypertension    Joint pain    Obstructive sleep apnea 10/06/2022   Rheumatoid arthritis      Social History   Tobacco Use   Smoking status: Never   Smokeless tobacco: Never  Vaping Use   Vaping Use: Never used  Substance Use Topics   Alcohol use: No   Drug use: No    Past Surgical History:  Procedure Laterality Date   ABDOMINAL HYSTERECTOMY  2004   CESAREAN SECTION  2003   KNEE ARTHROSCOPY Right 2013    Family History  Problem Relation Age of Onset   Colon cancer Mother 68   Cancer Mother    Diabetes Mother    High blood pressure Mother    Obesity Mother    Heart disease Father        CHF   Hypertension Father    Heart attack Father 54   Stroke Father    Alcoholism Father    Diabetes Sister    Breast cancer Sister 79   Congestive Heart Failure Sister    Heart attack Sister    Pancreatic cancer Sister 32   Heart failure  Brother    Heart disease Brother        stent   Heart Problems Paternal Uncle    Colon cancer Maternal Grandfather        dx >50    No Known Allergies  Current Medications:   Current Outpatient Medications:    albuterol (VENTOLIN HFA) 108 (90 Base) MCG/ACT inhaler, Inhale 2 puffs into the lungs every 6 (six) hours as needed for wheezing or shortness of breath., Disp: 8 g, Rfl: 0   ALPRAZolam (XANAX) 0.5 MG tablet, Take 0.5 mg by mouth 4 (four) times daily as needed. , Disp: , Rfl:    amLODipine (NORVASC) 10 MG tablet, Take 1 tablet (10 mg total) by mouth daily., Disp: 90 tablet, Rfl: 3   Cholecalciferol (VITAMIN D3) 25 MCG (1000 UT) capsule, Take 1 capsule (1,000 Units total) by mouth daily., Disp: , Rfl:    FLUoxetine (PROZAC) 40 MG capsule, fluoxetine 40 mg capsule  TAKE 1 CAPSULE BY MOUTH EVERY DAY, Disp: , Rfl:    hydrochlorothiazide (HYDRODIURIL) 25 MG tablet, Take 1 tablet (25 mg total) by mouth daily., Disp: 90 tablet, Rfl: 3   ibuprofen (ADVIL)  200 MG tablet, Take 400 mg by mouth as needed., Disp: , Rfl:    montelukast (SINGULAIR) 10 MG tablet, TAKE 1 TABLET(10 MG) BY MOUTH AT BEDTIME, Disp: 90 tablet, Rfl: 1   Multiple Vitamin (MULTIVITAMIN) tablet, Take 1 tablet by mouth daily., Disp: , Rfl:    omeprazole (PRILOSEC) 20 MG capsule, Take 20 mg by mouth daily., Disp: , Rfl:    triamcinolone (KENALOG) 0.1 %, SMARTSIG:Sparingly Topical Twice Daily, Disp: , Rfl:    Review of Systems:   Review of Systems  Constitutional:  Negative for fever and malaise/fatigue.  HENT:  Positive for congestion.   Eyes:  Negative for blurred vision.  Respiratory:  Positive for cough. Negative for sputum production and shortness of breath.   Cardiovascular:  Negative for chest pain, palpitations and leg swelling.  Gastrointestinal:  Negative for vomiting.  Musculoskeletal:  Negative for back pain.  Skin:  Negative for rash.  Neurological:  Negative for loss of consciousness and headaches.     Vitals:   Vitals:   11/16/22 0946  BP: 132/80  Pulse: 72  Temp: 97.7 F (36.5 C)  TempSrc: Temporal  SpO2: 97%  Weight: 247 lb (112 kg)  Height: 5\' 5"  (1.651 m)     Body mass index is 41.1 kg/m.  Physical Exam:   Physical Exam Vitals and nursing note reviewed.  Constitutional:      General: She is not in acute distress.    Appearance: She is well-developed. She is not ill-appearing or toxic-appearing.  Cardiovascular:     Rate and Rhythm: Normal rate and regular rhythm.     Pulses: Normal pulses.     Heart sounds: Normal heart sounds, S1 normal and S2 normal.  Pulmonary:     Effort: Pulmonary effort is normal.     Breath sounds: Normal breath sounds.  Skin:    General: Skin is warm and dry.  Neurological:     Mental Status: She is alert.     GCS: GCS eye subscore is 4. GCS verbal subscore is 5. GCS motor subscore is 6.  Psychiatric:        Speech: Speech normal.        Behavior: Behavior normal. Behavior is cooperative.     Assessment and Plan:   Chronic cough Ongoing Given chronicity, recommend CXR -- this has been ordered Advised as follows: "Start claritin Start QVAR inhaler in AM and PM -- rinse mouth after use If too expensive -- tell pharmacy to contact us for alternative! Use albuterol less if possible"  Recommend follow-up with me in two weeks (CPE already scheduled) and we can review cough sx at that time  I,Alexander Ruley,acting as a scribe for [employer], PA.,have documented all relevant documentation on the behalf of [name], PA,as directed by  [name], PA while in the presence of [name], [place].   I, [name], [place], have reviewed all documentation for this visit. The documentation on 11/16/22 for the exam, diagnosis, procedures, and orders are all accurate and complete.   [name], PA-C

## 2022-11-16 NOTE — Patient Instructions (Addendum)
It was great to see you!  Start claritin  Start QVAR inhaler in AM and PM -- rinse mouth after use If too expensive -- tell pharmacy to contact us for alternative!  Use albuterol less if possible   An order for an xray has been put in for you. To have this done, you can walk in at the [facility] location without a scheduled appointment.  The address is [address] It is across the street from [facility]. Lab and x-xray are located in the basement.   Hours of operation are M-F 8:30am to 5:00pm.  Please note that they are closed for lunch between 12:30 and 1:00pm.    Take care,  [name] PA-C

## 2022-11-17 NOTE — Telephone Encounter (Signed)
I need a download to review to see what adjustments we can make. If you can pull one, that'd be great!

## 2022-11-25 ENCOUNTER — Ambulatory Visit (INDEPENDENT_AMBULATORY_CARE_PROVIDER_SITE_OTHER)
Admission: RE | Admit: 2022-11-25 | Discharge: 2022-11-25 | Disposition: A | Discharge status: Home / Self Care | Payer: 59 | Source: Ambulatory Visit | Attending: Physician Assistant | Admitting: Physician Assistant

## 2022-11-25 DIAGNOSIS — R053 Chronic cough: Secondary | ICD-10-CM

## 2022-11-28 ENCOUNTER — Telehealth: Payer: Self-pay

## 2022-11-28 ENCOUNTER — Encounter: Payer: 59 | Admitting: Physician Assistant

## 2022-11-28 NOTE — Telephone Encounter (Signed)
   Pre-operative Risk Assessment    Patient Name: Aimee [name]  DOB: [birth year] MRN: [id number]      Request for Surgical Clearance    Procedure:   LEFT TOTAL KNEE  Date of Surgery:  Clearance 03/20/23                                 Surgeon:  DR. Latanya Maudlin Surgeon's Group or Practice Name:  [facility] Phone number:  [phone] ATTN: Aida Raider Fax number:  [phone]   Type of Clearance Requested:   - Medical    Type of Anesthesia:   CHOICE    Additional requests/questions:    SignedMichaelle Copas   11/28/2022, 1:48 PM

## 2022-11-28 NOTE — Telephone Encounter (Signed)
Pt has been scheduled to see Robin Searing, NP, 01/18/23, clearance can be addressed at that time.  Will route to the requesting surgeon's office to make them aware.

## 2022-11-28 NOTE — Telephone Encounter (Signed)
   Name: TAKIRAH MURAOKA  DOB: [birth year]  MRN: [id number]  Primary Cardiologist: None  Chart reviewed as part of pre-operative protocol coverage. Because of Ruthellen Bemiss Hobdy's past medical history and time since last visit, she will require a follow-up in-office visit in order to better assess preoperative cardiovascular risk.  Pre-op covering staff: - Please schedule appointment and call patient to inform them. If patient already had an upcoming appointment within acceptable timeframe, please add "pre-op clearance" to the appointment notes so provider is aware. - Please contact requesting surgeon's office via preferred method (i.e, phone, fax) to inform them of need for appointment prior to surgery.  No medications need held.  Sharlene Dory, PA-C  11/28/2022, 3:48 PM

## 2022-11-29 ENCOUNTER — Encounter: Payer: Self-pay | Admitting: Physician Assistant

## 2022-11-29 ENCOUNTER — Ambulatory Visit (INDEPENDENT_AMBULATORY_CARE_PROVIDER_SITE_OTHER): Payer: 59 | Admitting: Physician Assistant

## 2022-11-29 VITALS — BP 130/80 | HR 65 | Temp 97.5°F | Ht 65.0 in | Wt 239.4 lb

## 2022-11-29 DIAGNOSIS — G4733 Obstructive sleep apnea (adult) (pediatric): Secondary | ICD-10-CM

## 2022-11-29 DIAGNOSIS — Z0000 Encounter for general adult medical examination without abnormal findings: Secondary | ICD-10-CM | POA: Diagnosis not present

## 2022-11-29 DIAGNOSIS — F321 Major depressive disorder, single episode, moderate: Secondary | ICD-10-CM

## 2022-11-29 DIAGNOSIS — I10 Essential (primary) hypertension: Secondary | ICD-10-CM | POA: Diagnosis not present

## 2022-11-29 DIAGNOSIS — F419 Anxiety disorder, unspecified: Secondary | ICD-10-CM | POA: Diagnosis not present

## 2022-11-29 DIAGNOSIS — E669 Obesity, unspecified: Secondary | ICD-10-CM

## 2022-11-29 DIAGNOSIS — R053 Chronic cough: Secondary | ICD-10-CM | POA: Diagnosis not present

## 2022-11-29 NOTE — Progress Notes (Signed)
Subjective:    Aimee [name] is a 62 y.o. female and is here for a comprehensive physical exam.  HPI  There are no preventive care reminders to display for this patient.  Acute Concerns: Left knee replacement: She is scheduled for a left knee arthroplasty with Dr. Lequita Halt on 03/20/2023. She plans to follow up with cardiology for surgical clearance.  Denies any chest pain or SOB with activity.  Chronic Issues: Chronic cough: She reports her chronic cough has resolved.  She is compliant with Claritin and 10 mg Singulair. Not needed to use albuterol since starting Qvar.  She stopped using her CPAP due to the intense pressure.  HTN:  Her blood pressure is well controlled.  Currently taking: amlodipine 10 mg daily, HCTZ 25 mg daily She has been following up with cardiology.  BP Readings from Last 3 Encounters:  11/29/22 130/80  11/16/22 132/80  11/15/22 136/86   Obesity: She has lost about 8 lbs since her last visit on 4/3. [facility] 2 weeks and monitors her weight there.  She does not monitor her weight at home.  She notes her eating patterns have improved significantly.  Wt Readings from Last 3 Encounters:  11/29/22 239 lb 6.1 oz (108.6 kg)  11/16/22 247 lb (112 kg)  11/15/22 244 lb 9.6 oz (110.9 kg)   Anxiety/Depression Currently well controlled Sees Dr Evelene Croon for management of this Currently taking prozac 40 mg daily and prn xanax 0.5 mg Denies SI/HI  Health Maintenance: Immunizations -- UTD Colonoscopy -- Last on 03/26/2019. Internal Hemorrhoids. Results were otherwise normal.  Mammogram -- Last on 09/12/2022. Results were normal.  PAP -- Last on 04/01/2015.  Bone Density -- N/A Diet -- Diet has improved Exercise -- Staying active  Sleep habits -- No concerns Mood -- Stable  UTD with dentist? - yes UTD with eye doctor? - yes  Weight history: Wt Readings from Last 10 Encounters:  11/29/22 239 lb 6.1 oz (108.6 kg)   11/16/22 247 lb (112 kg)  11/15/22 244 lb 9.6 oz (110.9 kg)  11/01/22 251 lb 12.8 oz (114.2 kg)  10/05/22 256 lb 8 oz (116.3 kg)  08/18/22 258 lb 3.2 oz (117.1 kg)  07/27/22 256 lb 8 oz (116.3 kg)  07/20/22 253 lb (114.8 kg)  06/07/22 256 lb 3.2 oz (116.2 kg)  05/11/22 256 lb (116.1 kg)   Body mass index is 39.83 kg/m. No LMP recorded. Patient has had a hysterectomy.  Alcohol use:  reports no history of alcohol use.  Tobacco use:  Tobacco Use: Low Risk  (11/29/2022)   Patient History    Smoking Tobacco Use: Never    Smokeless Tobacco Use: Never    Passive Exposure: Not on file   Eligible for lung cancer screening? no     11/29/2022    8:51 AM  Depression screen PHQ 2/9  Decreased Interest 0  Down, Depressed, Hopeless 1  PHQ - 2 Score 1  Altered sleeping 1  Tired, decreased energy 0  Change in appetite 0  Feeling bad or failure about yourself  0  Trouble concentrating 0  Moving slowly or fidgety/restless 0  Suicidal thoughts 0  PHQ-9 Score 2  Difficult doing work/chores Not difficult at all     Other providers/specialists: Patient Care Team: [name], [place] as PCP - General (Physician Assistant)    PMHx, SurgHx, SocialHx, Medications, and Allergies were reviewed in the Visit Navigator and updated as appropriate.   Past  Medical History:  Diagnosis Date   Allergy    Anxiety    Arthritis    Depression    Family history of breast cancer    Family history of colon cancer    Family history of pancreatic cancer    GERD (gastroesophageal reflux disease)    Hypertension    Joint pain    Obstructive sleep apnea 10/06/2022   Rheumatoid arthritis      Past Surgical History:  Procedure Laterality Date   ABDOMINAL HYSTERECTOMY  2004   CESAREAN SECTION  2003   KNEE ARTHROSCOPY Right 2013     Family History  Problem Relation Age of Onset   Colon cancer Mother 62   Cancer Mother    Diabetes Mother    High blood pressure Mother    Obesity Mother     Heart disease Father        CHF   Hypertension Father    Heart attack Father 6   Stroke Father    Alcoholism Father    Diabetes Sister    Breast cancer Sister 81   Congestive Heart Failure Sister    Heart attack Sister    Pancreatic cancer Sister 57   Heart failure Brother    Heart disease Brother        stent   Heart Problems Paternal Uncle    Colon cancer Maternal Grandfather        dx >50    Social History   Tobacco Use   Smoking status: Never   Smokeless tobacco: Never  Vaping Use   Vaping Use: Never used  Substance Use Topics   Alcohol use: No   Drug use: No    Review of Systems:   Review of Systems  Constitutional:  Negative for chills, fever, malaise/fatigue and weight loss.  HENT:  Negative for hearing loss, sinus pain and sore throat.   Respiratory:  Negative for cough and hemoptysis.   Cardiovascular:  Negative for chest pain, palpitations, leg swelling and PND.  Gastrointestinal:  Negative for abdominal pain, constipation, diarrhea, heartburn, nausea and vomiting.  Genitourinary:  Negative for dysuria, frequency and urgency.  Musculoskeletal:  Negative for back pain, myalgias and neck pain.  Skin:  Negative for itching and rash.  Neurological:  Negative for dizziness, tingling, seizures and headaches.  Endo/Heme/Allergies:  Negative for polydipsia.  Psychiatric/Behavioral:  Negative for depression. The patient is not nervous/anxious.     Objective:   BP 130/80 (BP Location: Left Arm, Patient Position: Sitting, Cuff Size: Large)   Pulse 65   Temp (!) 97.5 F (36.4 C) (Temporal)   Ht 5\' 5"  (1.651 m)   Wt 239 lb 6.1 oz (108.6 kg)   SpO2 98%   BMI 39.83 kg/m  Body mass index is 39.83 kg/m.   General Appearance:    Alert, cooperative, no distress, appears stated age  Head:    Normocephalic, without obvious abnormality, atraumatic  Eyes:    PERRL, conjunctiva/corneas clear, EOM's intact, fundi    benign, both eyes  Ears:    Normal TM's and  external ear canals, both ears  Nose:   Nares normal, septum midline, mucosa normal, no drainage    or sinus tenderness  Throat:   Lips, mucosa, and tongue normal; teeth and gums normal  Neck:   Supple, symmetrical, trachea midline, no adenopathy;    thyroid:  no enlargement/tenderness/nodules; no carotid   bruit or JVD  Back:     Symmetric, no curvature, ROM normal, no CVA  tenderness  Lungs:     Clear to auscultation bilaterally, respirations unlabored  Chest Wall:    No tenderness or deformity   Heart:    Regular rate and rhythm, S1 and S2 normal, no murmur, rub or gallop  Breast Exam:    Deferred   Abdomen:     Soft, non-tender, bowel sounds active all four quadrants,    no masses, no organomegaly  Genitalia:    Deferred   Extremities:   Extremities normal, atraumatic, no cyanosis or edema  Pulses:   2+ and symmetric all extremities  Skin:   Skin color, texture, turgor normal, no rashes or lesions  Lymph nodes:   Cervical, supraclavicular, and axillary nodes normal  Neurologic:   CNII-XII intact, normal strength, sensation and reflexes    throughout    Assessment/Plan:   Routine physical examination Today patient counseled on age appropriate routine health concerns for screening and prevention, each reviewed and up to date or declined. Immunizations reviewed and up to date or declined. Labs ordered and reviewed. Risk factors for depression reviewed and negative. Hearing function and visual acuity are intact. ADLs screened and addressed as needed. Functional ability and level of safety reviewed and appropriate. Education, counseling and referrals performed based on assessed risks today. Patient provided with a copy of personalized plan for preventive services.  Hypertension, unspecified type Normotensive Continue amlodipine 10 mg daily, HCTZ 25 mg daily Follow-up with cardiology as scheduled  Chronic cough Improved May consider stopping QVAR or claritin in step-wise fashion to see  if one is more effective than another Follow-up if new/worsening sx  Anxiety; Depression, major, single episode, moderate Well controlled Continue mgmt per psych  OSA on CPAP Non-compliant with CPAP Continue weight loss efforts Follow-up with pulm to discuss concerns with CPAP  Obesity, unspecified classification, unspecified obesity type, unspecified whether serious comorbidity present No red flags Continue management with [facility]   I,Rachel Rivera,acting as a scribe for [employer], PA.,have documented all relevant documentation on the behalf of [name], PA,as directed by  [name], PA while in the presence of [name], [place].  I, [name], [place], have reviewed all documentation for this visit. The documentation on 11/29/22 for the exam, diagnosis, procedures, and orders are all accurate and complete.]   [name], PA-C [facility]

## 2022-11-29 NOTE — Patient Instructions (Addendum)
It was great to see you!  Keep up the good work!  Take care,     

## 2022-12-01 ENCOUNTER — Ambulatory Visit (INDEPENDENT_AMBULATORY_CARE_PROVIDER_SITE_OTHER): Payer: 59 | Admitting: Family Medicine

## 2022-12-01 ENCOUNTER — Other Ambulatory Visit: Payer: Self-pay | Admitting: Physician Assistant

## 2022-12-01 ENCOUNTER — Encounter (INDEPENDENT_AMBULATORY_CARE_PROVIDER_SITE_OTHER): Payer: Self-pay | Admitting: Family Medicine

## 2022-12-01 VITALS — BP 137/84 | HR 71 | Temp 98.3°F | Ht 65.0 in | Wt 240.0 lb

## 2022-12-01 DIAGNOSIS — E88819 Insulin resistance, unspecified: Secondary | ICD-10-CM | POA: Diagnosis not present

## 2022-12-01 DIAGNOSIS — I10 Essential (primary) hypertension: Secondary | ICD-10-CM

## 2022-12-01 DIAGNOSIS — E559 Vitamin D deficiency, unspecified: Secondary | ICD-10-CM | POA: Diagnosis not present

## 2022-12-01 DIAGNOSIS — Z6841 Body Mass Index (BMI) 40.0 and over, adult: Secondary | ICD-10-CM

## 2022-12-01 NOTE — Progress Notes (Signed)
[name], D.O.  ABFM, ABOM Specializing in Clinical Bariatric Medicine  Office located at: [address] [place], [place]  [address]     Assessment and Plan:   No orders of the defined types were placed in this encounter.   There are no discontinued medications.   No orders of the defined types were placed in this encounter.   Hypertension, unspecified type Assessment: Condition is Improving, but not optimized.. Labs were reviewed.She has been compliant with Hydrodiuril 25 mg daily and Norvasc 10 mg daily. Denies any side effects. No concerns. Asymptomatic.  She states she's unsure to why her BP is high today as it was normal at her last doctor visit few days ago.  Last 3 blood pressure readings in our office are as follows: BP Readings from Last 3 Encounters:  12/01/22 137/84  11/29/22 130/80  11/16/22 132/80   The 10-year ASCVD risk score (Arnett DK, et al., 2019) is: 6.9%  Lab Results  Component Value Date   CREATININE 0.80 11/01/2022     Plan:Continue medications per her PCP recommendations.  BP is at goal today.  Counseled [name] on pathophysiology of disease and discussed treatment plan, which always includes dietary and lifestyle modification as first line.  Lifestyle changes such as following our low salt, heart healthy meal plan and engaging in a regular exercise program discussed  - Avoid buying foods that are: processed, frozen, or prepackaged to avoid excess salt. - Ambulatory blood pressure monitoring encouraged.  Reminded patient that if they ever feel poorly in any way, to check their blood pressure and pulse as well. - We will continue to monitor closely alongside PCP/ specialists.  Pt reminded to also f/up with those individuals as instructed by them.  - We will continue to monitor symptoms as they relate to the her weight loss journey.   Insulin resistance Assessment: Condition is Worsening.. Labs were reviewed. She is currently not  on any medications.  Lab Results  Component Value Date   HGBA1C 5.5 11/01/2022   HGBA1C 5.3 11/09/2020   HGBA1C 5.5 11/06/2019   INSULIN 18.4 11/01/2022     Plan:Continue dietary maintenance.   - I counseled patient on pathophysiology of the disease process of I.R. and Pre-DM. - Stressed importance of dietary and lifestyle modifications to result in weight loss as first line txmnt - In addition, we discussed the risks and benefits of various medication options which can help [place] in the management of this disease process as well as with weight loss.  Will consider starting one of these meds in future as we will focus on prudent nutritional plan at this time.  - Continue to decrease simple carbs/ sugars; increase fiber and proteins -> follow her meal plan.   - Explained role of simple carbs and insulin levels on hunger and cravings - Handouts provided at pt's request after education provided.  All concerns/questions addressed.   - Anticipatory guidance given.   Adri Schloss will continue to work on weight loss, exercise, via their meal plan we devised to help decrease the risk of progressing to diabetes.  - We will recheck A1c and fasting insulin level in approximately 3 months from last check, or as deemed appropriate.   Vitamin D deficiency Assessment: Condition is Improving, but not optimized.. Labs were reviewed. Patient is compliant with OTC Vitamin D3 1000 units daily. Denies any side effects.  Lab Results  Component Value Date   VD25OH 48.4 11/01/2022   VD25OH  34.21 11/26/2021   VD25OH 28.99 (L) 11/09/2020   Plan:Continue Vitamin D3 1000 units daily  - I discussed the importance of vitamin D to the patient's health and well-being as well as to their ability to lose weight.  - I reviewed possible symptoms of low Vitamin D:  low energy, depressed mood, muscle aches, joint aches, osteoporosis etc. with patient - It has been show that administration of vitamin D supplementation leads to  improved satiety and a decrease in inflammatory markers.  Hence, low Vitamin D levels may be linked to an increased risk of cardiovascular events and even increased risk of cancers- such as colon and breast. - ideal vitamin D levels reviewed with patient   - Informed patient this may be a lifelong thing, and she was encouraged to continue to take the medicine until told otherwise.    - weight loss will likely improve availability of vitamin D, thus encouraged Rhen to continue with meal plan and their weight loss efforts to further improve this condition.  Thus, we will need to monitor levels regularly (every 3-4 mo on average) to keep levels within normal limits and prevent over supplementation. - pt's questions and concerns regarding this condition addressed.    TREATMENT PLAN FOR OBESITY: BMI 40.0-44.9, adult (HCC)-current bmi 40.7 Morbid obesity (HCC)-start bmi 41.9/date 10/31/21 Assessment: Condition is docourse: improving. Biometric data collected today, was reviewed with patient.  She states that she goes over the recommended snack calories. She states that she wasn't following her meal plan accurately as she only has one or the other food options. Fat mass has decreased by 1.4lb. Muscle mass has decreased by 1.2lb. Total body water has increased by 0.8lb.   Plan:  Psalm is currently in the action stage of change. As such, her goal is to continue weight management plan. Siarra will work on healthier eating habits and try their best to follow the Continue Category 2 Plan with 8-10 oz at dinner and 4-6 oz for lunch and options   best they can.   Behavioral Intervention Additional resources provided today: category 2 meal plan information and breakfast options Evidence-based interventions for health behavior change were utilized today including the discussion of self monitoring techniques, problem-solving barriers and SMART goal setting techniques.   Regarding patient's less desirable eating  habits and patterns, we employed the technique of small changes.  Pt will specifically work on: food journaling for next visit.    Recommended Physical Activity Goals Violett has been advised to work up to 150 minutes of moderate intensity aerobic activity a week and strengthening exercises 2-3 times per week for cardiovascular health, weight loss maintenance and preservation of muscle mass.  She has agreed to Continue current level of physical activity   FOLLOW UP: Return in about 2 weeks (around 12/15/2022). She was informed of the importance of frequent follow up visits to maximize her success with intensive lifestyle modifications for her multiple health conditions.   Subjective:   Chief complaint: Obesity Rorey is here to discuss her progress with her obesity treatment plan. She is on the the Category 2 Plan with 8-10 oz at dinner and lunch options and states she is following her eating plan approximately 85% of the time. She states she is exercising 60 minutes of water aerobics 5 days per week.  Interval History:  CINTYA DAUGHETY is here for a follow up office visit. Since last office visit she states that she  She states that she has 2 eggs, cheese, and  a slice of toast or greek yogurt without the toast for breakfast. She states that she wasn't following her meal plan accurately as she only has one or the other food options and thus she's been skipping lunches.   We reviewed her meal plan and all questions were answered. Patient's food recall appears to be accurate and consistent with what is on plan when she is following it. When eating on plan, her hunger and cravings are well controlled.      Pharmacotherapy for weight loss: She is not currently taking medications  for medical weight loss.  Denies side effects.    Review of Systems:  Pertinent positives were addressed with patient today.   Weight Summary and Biometrics   Weight Lost Since Last Visit: 4lb  Weight Gained Since  Last Visit: 0    Vitals Temp: 98.3 F (36.8 C) BP: 137/84 Pulse Rate: 71 SpO2: 98 %   Anthropometric Measurements Height: 5\' 5"  (1.651 m) Weight: 240 lb (108.9 kg) BMI (Calculated): 39.94 Weight at Last Visit: 244lb Weight Lost Since Last Visit: 4lb Weight Gained Since Last Visit: 0 Starting Weight: 251lb Total Weight Loss (lbs): 11 lb (4.99 kg) Peak Weight: 270lb   Body Composition  Body Fat %: 48 % Fat Mass (lbs): 115.2 lbs Muscle Mass (lbs): 118.4 lbs Total Body Water (lbs): 91.2 lbs Visceral Fat Rating : 15   Other Clinical Data Fasting: no Labs: no Today's Visit #: 3 Starting Date: 11/01/22     Objective:   PHYSICAL EXAM: Blood pressure 137/84, pulse 71, temperature 98.3 F (36.8 C), height 5\' 5"  (1.651 m), weight 240 lb (108.9 kg), SpO2 98 %. Body mass index is 39.94 kg/m.  General: Well Developed, well nourished, and in no acute distress.  HEENT: Normocephalic, atraumatic Skin: Warm and dry, cap RF less 2 sec, good turgor Chest:  Normal excursion, shape, no gross abn Respiratory: speaking in full sentences, no conversational dyspnea NeuroM-Sk: Ambulates w/o assistance, moves * 4 Psych: A and O *3, insight good, mood-full  DIAGNOSTIC DATA REVIEWED:  BMET    Component Value Date/Time   NA 144 11/01/2022 0848   K 4.1 11/01/2022 0848   CL 102 11/01/2022 0848   CO2 27 11/01/2022 0848   GLUCOSE 88 11/01/2022 0848   GLUCOSE 76 11/26/2021 1133   BUN 19 11/01/2022 0848   CREATININE 0.80 11/01/2022 0848   CALCIUM 9.2 11/01/2022 0848   GFRNONAA >60 01/18/2007 1400   GFRAA  01/18/2007 1400    >60        The eGFR has been calculated using the MDRD equation. This calculation has not been validated in all clinical   Lab Results  Component Value Date   HGBA1C 5.5 11/01/2022   HGBA1C 5.4 10/10/2018   Lab Results  Component Value Date   INSULIN 18.4 11/01/2022   Lab Results  Component Value Date   TSH 1.540 11/01/2022   CBC     Component Value Date/Time   WBC 6.2 11/01/2022 0848   WBC 5.5 11/26/2021 1133   RBC 4.61 11/01/2022 0848   RBC 4.65 11/26/2021 1133   HGB 11.5 11/01/2022 0848   HCT 38.2 11/01/2022 0848   PLT 298 11/01/2022 0848   MCV 83 11/01/2022 0848   MCH 24.9 (L) 11/01/2022 0848   MCHC 30.1 (L) 11/01/2022 0848   MCHC 31.6 11/26/2021 1133   RDW 13.8 11/01/2022 0848   Iron Studies No results found for: "IRON", "TIBC", "FERRITIN", "IRONPCTSAT" Lipid Panel  Component Value Date/Time   CHOL 152 11/01/2022 0848   TRIG 59 11/01/2022 0848   HDL 49 11/01/2022 0848   CHOLHDL 3 11/26/2021 1133   VLDL 10.4 11/26/2021 1133   LDLCALC 91 11/01/2022 0848   Hepatic Function Panel     Component Value Date/Time   PROT 7.2 11/01/2022 0848   ALBUMIN 4.2 11/01/2022 0848   AST 14 11/01/2022 0848   ALT 15 11/01/2022 0848   ALKPHOS 74 11/01/2022 0848   BILITOT 0.3 11/01/2022 0848      Component Value Date/Time   TSH 1.540 11/01/2022 0848   Nutritional Lab Results  Component Value Date   VD25OH 48.4 11/01/2022   VD25OH 34.21 11/26/2021   VD25OH 28.99 (L) 11/09/2020    Attestations:   Reviewed by clinician on day of visit: allergies, medications, problem list, medical history, surgical history, family history, social history, and previous encounter notes.   Patient was in the office today and time spent on visit including pre-visit chart review and post-visit care/coordination of care and electronic medical record documentation was 30 minutes. 50% of the time was in face to face counseling of this patient's medical condition(s) and providing education on treatment options to include the first-line treatment of diet and lifestyle modification.   I,Safa M Kadhim,acting as a scribe for [employer], DO.,have documented all relevant documentation on the behalf of Thomasene Lot, DO,as directed by  Thomasene Lot, DO while in the presence of Thomasene Lot, DO.   I, Thomasene Lot, DO, have  reviewed all documentation for this visit. The documentation on 12/01/22 for the exam, diagnosis, procedures, and orders are all accurate and complete.

## 2022-12-14 ENCOUNTER — Ambulatory Visit (INDEPENDENT_AMBULATORY_CARE_PROVIDER_SITE_OTHER): Payer: 59 | Admitting: Family Medicine

## 2022-12-14 ENCOUNTER — Encounter (INDEPENDENT_AMBULATORY_CARE_PROVIDER_SITE_OTHER): Payer: Self-pay | Admitting: Family Medicine

## 2022-12-14 VITALS — BP 133/72 | HR 67 | Temp 98.5°F | Ht 65.0 in | Wt 237.8 lb

## 2022-12-14 DIAGNOSIS — E559 Vitamin D deficiency, unspecified: Secondary | ICD-10-CM | POA: Diagnosis not present

## 2022-12-14 DIAGNOSIS — I10 Essential (primary) hypertension: Secondary | ICD-10-CM

## 2022-12-14 DIAGNOSIS — E88819 Insulin resistance, unspecified: Secondary | ICD-10-CM

## 2022-12-14 DIAGNOSIS — Z6839 Body mass index (BMI) 39.0-39.9, adult: Secondary | ICD-10-CM | POA: Insufficient documentation

## 2022-12-14 NOTE — Progress Notes (Signed)
[name], D.O.  ABFM, ABOM Specializing in Clinical Bariatric Medicine  Office located at: [address] [place], [place]  [address]     Assessment and Plan:   Insulin resistance Assessment: Condition is Not optimized.. Lab Results  Component Value Date   HGBA1C 5.5 11/01/2022   HGBA1C 5.3 11/09/2020   HGBA1C 5.5 11/06/2019   INSULIN 18.4 11/01/2022  - Patient is not currently on any medication for this condition. This is diet controlled. - She endorses that her hunger is controlled. - She reports having cravings for sweets just in the last few days.  Plan: - We discussed healthy sweet foods that she can eat when having cravings.  Nekayla Schreifels will continue to work on weight loss, exercise, via their meal plan we devised to help decrease the risk of progressing to diabetes.    Vitamin D deficiency Assessment: Condition is Improving, but not optimized. Lab Results  Component Value Date   VD25OH 48.4 11/01/2022   VD25OH 34.21 11/26/2021   VD25OH 28.99 (L) 11/09/2020  - No issues with OTC Vitamin D3 1,000 units daily. Denies any GI or other side effects.  Plan: - Continue with OTC med as prescribed.  - weight loss will likely improve availability of vitamin D, thus encouraged Shayvon to continue with meal plan and their weight loss efforts to further improve this condition.     Hypertension, unspecified type  Assessment: Condition is improved from prior Vitals:   12/14/22 1500  BP: 133/72   She has a family hx of HTN and has had high bp for several years. Patient is complaint with Hydrodiuril  25 mg daily and Norvasc 10 mg daily, denies any side effects.  No concerns today   Plan: She will continue with Hydrodiuril  25 mg daily  and Norvasc 10 mg daily as recommended by PCP Counseled [name] on pathophysiology of disease and discussed treatment plan of following our low salt, heart healthy meal plan and engaging in a regular exercise program to achieve  wt loss     TREATMENT PLAN FOR OBESITY: BMI 40.0-44.9, adult (HCC)-current bmi 39.57 Morbid obesity (HCC)-start bmi 41.9/date 10/31/21 Assessment: Condition is improving, but not optimized. Biometric data collected today, was reviewed with patient.  Fat mass has decreased by 10 lb. Muscle mass has increased by 7.6 lb. Total body water has increased by 6.4 lb.   Plan:  Ronette is currently in the action stage of change. As such, her goal is to continue weight management plan. Adryanna will work on healthier eating habits and continue the Category 2 meal plan with 4-6 ounces at lunch and 8-10 ounces at dinner and journaling her intake.  Behavioral Intervention Additional resources provided today: patient declined Evidence-based interventions for health behavior change were utilized today including the discussion of self monitoring techniques, problem-solving barriers and SMART goal setting techniques.   Regarding patient's less desirable eating habits and patterns, we employed the technique of small changes.  Pt will specifically work on: continue with meal plan and current exercise regiment for next visit.    Recommended Physical Activity Goals Raziel has been advised to work up to 150 minutes of moderate intensity aerobic activity a week and strengthening exercises 2-3 times per week for cardiovascular health, weight loss maintenance and preservation of muscle mass.  She has agreed to Continue current level of physical activity    FOLLOW UP: Return in about 4 weeks (around 01/11/2023). She was informed of the importance of  frequent follow up visits to maximize her success with intensive lifestyle modifications for her multiple health conditions.   Subjective:   Chief complaint: Obesity Sophronia is here to discuss her progress with her obesity treatment plan. She is on the Category 2 Plan with 4-6 ounces at lunch and 8-10 ounces at dinner and journaling her intake  and states she is following  her eating plan approximately 85% of the time. She states she is exercising 60 minutes 5 days per week.  Interval History:  GENEL KUCERA is here for a follow up office visit.  Since last office visit: - She has been eating 4 ounces of protein at lunch and 8 ounces at dinner - For snacks, she typically eats low calorie popcorn. - She endorses that her hunger is controlled, but she has been having cravings for sweets in the last couple of days.   Review of Systems:  Pertinent positives were addressed with patient today.   Weight Summary and Biometrics   Weight Lost Since Last Visit: 3 lb  Weight Gained Since Last Visit: 0    Vitals Temp: 98.5 F (36.9 C) BP: 133/72 Pulse Rate: 67 SpO2: 96 %   Anthropometric Measurements Height: 5\' 5"  (1.651 m) Weight: 237 lb 12.8 oz (107.9 kg) BMI (Calculated): 39.57 Weight at Last Visit: 240 lb Weight Lost Since Last Visit: 3 lb Weight Gained Since Last Visit: 0 Starting Weight: 251.8 lb Total Weight Loss (lbs): 14 lb (6.35 kg) Peak Weight: 270 lb   Body Composition  Body Fat %: 44.2 % Fat Mass (lbs): 105.2 lbs Muscle Mass (lbs): 126 lbs Total Body Water (lbs): 97.6 lbs Visceral Fat Rating : 14   Other Clinical Data Fasting: No Labs: No Today's Visit #: 4 Starting Date: 11/01/22    Objective:   PHYSICAL EXAM: Blood pressure 133/72, pulse 67, temperature 98.5 F (36.9 C), height 5\' 5"  (1.651 m), weight 237 lb 12.8 oz (107.9 kg), SpO2 96 %. Body mass index is 39.57 kg/m.  General: Well Developed, well nourished, and in no acute distress.  HEENT: Normocephalic, atraumatic Skin: Warm and dry, cap RF less 2 sec, good turgor Chest:  Normal excursion, shape, no gross abn Respiratory: speaking in full sentences, no conversational dyspnea NeuroM-Sk: Ambulates w/o assistance, moves * 4 Psych: A and O *3, insight good, mood-full  DIAGNOSTIC DATA REVIEWED:  BMET    Component Value Date/Time   NA 144 11/01/2022 0848    K 4.1 11/01/2022 0848   CL 102 11/01/2022 0848   CO2 27 11/01/2022 0848   GLUCOSE 88 11/01/2022 0848   GLUCOSE 76 11/26/2021 1133   BUN 19 11/01/2022 0848   CREATININE 0.80 11/01/2022 0848   CALCIUM 9.2 11/01/2022 0848   GFRNONAA >60 01/18/2007 1400   GFRAA  01/18/2007 1400    >60        The eGFR has been calculated using the MDRD equation. This calculation has not been validated in all clinical   Lab Results  Component Value Date   HGBA1C 5.5 11/01/2022   HGBA1C 5.4 10/10/2018   Lab Results  Component Value Date   INSULIN 18.4 11/01/2022   Lab Results  Component Value Date   TSH 1.540 11/01/2022   CBC    Component Value Date/Time   WBC 6.2 11/01/2022 0848   WBC 5.5 11/26/2021 1133   RBC 4.61 11/01/2022 0848   RBC 4.65 11/26/2021 1133   HGB 11.5 11/01/2022 0848   HCT 38.2 11/01/2022 0848   PLT  298 11/01/2022 0848   MCV 83 11/01/2022 0848   MCH 24.9 (L) 11/01/2022 0848   MCHC 30.1 (L) 11/01/2022 0848   MCHC 31.6 11/26/2021 1133   RDW 13.8 11/01/2022 0848   Iron Studies No results found for: "IRON", "TIBC", "FERRITIN", "IRONPCTSAT" Lipid Panel     Component Value Date/Time   CHOL 152 11/01/2022 0848   TRIG 59 11/01/2022 0848   HDL 49 11/01/2022 0848   CHOLHDL 3 11/26/2021 1133   VLDL 10.4 11/26/2021 1133   LDLCALC 91 11/01/2022 0848   Hepatic Function Panel     Component Value Date/Time   PROT 7.2 11/01/2022 0848   ALBUMIN 4.2 11/01/2022 0848   AST 14 11/01/2022 0848   ALT 15 11/01/2022 0848   ALKPHOS 74 11/01/2022 0848   BILITOT 0.3 11/01/2022 0848      Component Value Date/Time   TSH 1.540 11/01/2022 0848   Nutritional Lab Results  Component Value Date   VD25OH 48.4 11/01/2022   VD25OH 34.21 11/26/2021   VD25OH 28.99 (L) 11/09/2020    Attestations:   Reviewed by clinician on day of visit: allergies, medications, problem list, medical history, surgical history, family history, social history, and previous encounter notes.  Patient was  in the office today and time spent on visit including pre-visit chart review and post-visit care/coordination of care and electronic medical record documentation was 30 minutes. 50% of that time was in face to face counseling of this patient's medical condition(s) and providing education on treatment options to always include the first-line treatment of diet and lifestyle modification.    I,Special Puri,acting as a Neurosurgeon for [employer], DO.,have documented all relevant documentation on the behalf of Thomasene Lot, DO,as directed by  Thomasene Lot, DO while in the presence of Thomasene Lot, DO.   I, Thomasene Lot, DO, have reviewed all documentation for this visit. The documentation on 12/14/22 for the exam, diagnosis, procedures, and orders are all accurate and complete.

## 2022-12-19 LAB — HM DEXA SCAN: HM Dexa Scan: NORMAL

## 2022-12-26 ENCOUNTER — Other Ambulatory Visit: Payer: Self-pay | Admitting: *Deleted

## 2022-12-26 DIAGNOSIS — Z79899 Other long term (current) drug therapy: Secondary | ICD-10-CM

## 2022-12-26 DIAGNOSIS — I10 Essential (primary) hypertension: Secondary | ICD-10-CM

## 2022-12-26 MED ORDER — HYDROCHLOROTHIAZIDE 25 MG PO TABS
25.0000 mg | ORAL_TABLET | Freq: Every day | ORAL | 0 refills | Status: DC
Start: 2022-12-26 — End: 2023-01-30

## 2022-12-28 ENCOUNTER — Ambulatory Visit (INDEPENDENT_AMBULATORY_CARE_PROVIDER_SITE_OTHER): Payer: 59 | Admitting: Family Medicine

## 2022-12-28 ENCOUNTER — Encounter (INDEPENDENT_AMBULATORY_CARE_PROVIDER_SITE_OTHER): Payer: Self-pay | Admitting: Family Medicine

## 2022-12-28 VITALS — BP 163/90 | HR 73 | Temp 98.5°F | Ht 65.0 in | Wt 237.0 lb

## 2022-12-28 DIAGNOSIS — Z6839 Body mass index (BMI) 39.0-39.9, adult: Secondary | ICD-10-CM

## 2022-12-28 DIAGNOSIS — F439 Reaction to severe stress, unspecified: Secondary | ICD-10-CM | POA: Diagnosis not present

## 2022-12-28 DIAGNOSIS — E559 Vitamin D deficiency, unspecified: Secondary | ICD-10-CM | POA: Diagnosis not present

## 2022-12-28 DIAGNOSIS — I10 Essential (primary) hypertension: Secondary | ICD-10-CM | POA: Diagnosis not present

## 2022-12-28 DIAGNOSIS — E88819 Insulin resistance, unspecified: Secondary | ICD-10-CM | POA: Diagnosis not present

## 2022-12-28 DIAGNOSIS — Z6841 Body Mass Index (BMI) 40.0 and over, adult: Secondary | ICD-10-CM

## 2022-12-28 NOTE — Progress Notes (Signed)
[name], D.O.  ABFM, ABOM Specializing in Clinical Bariatric Medicine  Office located at: [address] [place], [place]  [address]     Assessment and Plan:   No orders of the defined types were placed in this encounter.   There are no discontinued medications.   No orders of the defined types were placed in this encounter.    Vitamin D deficiency Assessment: Condition is almost at goal.  Lab Results  Component Value Date   VD25OH 48.4 11/01/2022   VD25OH 34.21 11/26/2021   VD25OH 28.99 (L) 11/09/2020  - Reports good compliance and tolerance with OTC Vitamin D3 1,000 units daily. Denies any side effects.   Plan: - Continue with OTC med.  - Will continue to monitor levels regularly (every 3-4 mo on average) to keep levels within normal limits and prevent over supplementation. - pt's questions and concerns regarding this condition addressed.     Insulin resistance Assessment: Condition is not optimized.  Lab Results  Component Value Date   HGBA1C 5.5 11/01/2022   HGBA1C 5.3 11/09/2020   HGBA1C 5.5 11/06/2019   INSULIN 18.4 11/01/2022  - Patient is not any med for this condition. This is diet controlled.  - Her hunger and cravings are controlled when eating on plan.   Plan: - We discussed the risks and benefits of Metformin which can help [place] in the management of this disease process as well as with weight loss.  Will consider starting one of these meds in future as we will focus on prudent nutritional plan at this time.  - Continue her prudent nutritional plan that is low in simple carbohydrates, saturated fats and trans fats to goal of 5-10% weight loss to achieve significant health benefits.  Pt encouraged to continually advance exercise and cardiovascular fitness as tolerated throughout weight loss journey.    Hypertension, unspecified type Assessment: Condition is elevated in office today.  Last 3 blood pressure readings in our office are as  follows: BP Readings from Last 3 Encounters:  12/28/22 (!) 163/90  12/14/22 133/72  12/01/22 137/84  - Pt endorses that her high blood pressure reading is likely due stress from work. Today she is asymptomatic, no concerns.  - She reports that the blood pressure machine at her home is not working properly.  - No issues with Norvasc 10 mg daily and Hydrodiuril 25 mg daily. Denies any side effects.   Plan: - Continue with all anti-hypertensive medications as recommended by PCP/specialist.  - Ambulatory blood pressure monitoring encouraged.  Reminded patient that if they ever feel poorly in any way, to check their blood pressure and pulse as well. - We will continue to monitor closely alongside PCP/ specialists.  Pt reminded to also f/up with those individuals as instructed by them.  - We will continue to monitor symptoms as they relate to the her weight loss journey.    Stress Assessment: Condition is not optimized. - Patient endorses that her stress from work has increased recently.  - Reports doing more emotional eating because of her stress. - She has been compliant with Prozac 40 mg daily. Denies any side effects.  Plan: - Continue with med as recommended by specialist  - I highly encouraged pt to contact Dr.Kaur about counseling and possibly changing the dose of her Prozac because of her increased stress.  - Behavior modification techniques were discussed today to help deal stress management, such as meditation/prayer and exercise.  - We will continue to  monitor closely alongside PCP / other specialists.    TREATMENT PLAN FOR OBESITY: BMI 40.0-44.9, adult (HCC)-current bmi 39.44 Morbid obesity (HCC)-start bmi 41.9/date 10/31/21 Assessment:  Aimee [name] is here to discuss her progress with her obesity treatment plan along with follow-up of her obesity related diagnoses. See Medical Weight Management Flowsheet for complete bioelectrical impedance results.  Condition is not  optimized.  Biometric data collected today, was reviewed with patient.   Since last office visit on 12/14/22 patient's Fat mass has increased by 12.6 lb. Muscle mass has decreased by 12 lb. Total body water has decreased by 4.8 lb.  Counseling done on how various foods will affect these numbers and how to maximize success  Total lbs lost to date: 14.8  Total weight loss percentage to date: 5.58    Plan:  Shahida is currently in the action stage of change. As such, her goal is to continue her weight management plan. [name] will work on healthier eating habits and continue the Category 2 meal plan with 4-6 ounces at lunch and 8-10 ounces at dinner and journaling her intake.   Behavioral Intervention Additional resources provided today:  Metformin  Evidence-based interventions for health behavior change were utilized today including the discussion of self monitoring techniques, problem-solving barriers and SMART goal setting techniques.   Regarding patient's less desirable eating habits and patterns, we employed the technique of small changes.  Pt will specifically work on: continue adherence to prudent nutritional plan,  working on self-care, bringing blood pressures  for next visit.    Recommended Physical Activity Goals  Maranatha has been advised to slowly work up to 150 minutes of moderate intensity aerobic activity a week and strengthening exercises 2-3 times per week for cardiovascular health, weight loss maintenance and preservation of muscle mass.   She has agreed to Continue current level of physical activity   FOLLOW UP: Return in about 2 weeks (around 01/11/2023). She was informed of the importance of frequent follow up visits to maximize her success with intensive lifestyle modifications for her multiple health conditions.  Subjective:   Chief complaint: Obesity Aimee [name] is here to discuss her progress with her obesity treatment plan. She is on the the Category 2 Plan and states she is  following her eating plan approximately 75-80% of the time. She states she is exercising 60 minutes 5-6 days per week.  Interval History:  Aimee [name] is here for a follow up office visit.  Since last office visit:   - Most of the time she is sleeping 7-9 hrs daily.  - Endorses she has been under a lot of stress from work. Reports she has been doing more emotional eating.  - States that she has not been eating all her protein.   We reviewed her meal plan and all questions were answered. Patient's food recall appears to be accurate and consistent with what is on plan when she is following it. When eating on plan, her hunger and cravings are well controlled.     Review of Systems:  Pertinent positives were addressed with patient today.  Weight Summary and Biometrics   Weight Lost Since Last Visit: 0  Weight Gained Since Last Visit: 0    Vitals Temp: 98.5 F (36.9 C) BP: (!) 163/90 Pulse Rate: 73 SpO2: 98 %   Anthropometric Measurements Height: 5\' 5"  (1.651 m) Weight: 237 lb (107.5 kg) BMI (Calculated): 39.44 Weight at Last Visit: 237 lb Weight Lost Since Last Visit: 0 Weight Gained Since  Last Visit: 0 Starting Weight: 251.8 lb Total Weight Loss (lbs): 14 lb (6.35 kg) Peak Weight: 270 lb   Body Composition  Body Fat %: 49.5 % Fat Mass (lbs): 117.8 lbs Muscle Mass (lbs): 114 lbs Total Body Water (lbs): 92.8 lbs Visceral Fat Rating : 16   Other Clinical Data Fasting: No Labs: No Today's Visit #: 5 Starting Date: 11/01/22   Objective:   PHYSICAL EXAM: Blood pressure (!) 163/90, pulse 73, temperature 98.5 F (36.9 C), height 5\' 5"  (1.651 m), weight 237 lb (107.5 kg), SpO2 98 %. Body mass index is 39.44 kg/m.  General: Well Developed, well nourished, and in no acute distress.  HEENT: Normocephalic, atraumatic Skin: Warm and dry, cap RF less 2 sec, good turgor Chest:  Normal excursion, shape, no gross abn Respiratory: speaking in full sentences, no  conversational dyspnea NeuroM-Sk: Ambulates w/o assistance, moves * 4 Psych: A and O *3, insight good, mood-full  DIAGNOSTIC DATA REVIEWED:  BMET    Component Value Date/Time   NA 144 11/01/2022 0848   K 4.1 11/01/2022 0848   CL 102 11/01/2022 0848   CO2 27 11/01/2022 0848   GLUCOSE 88 11/01/2022 0848   GLUCOSE 76 11/26/2021 1133   BUN 19 11/01/2022 0848   CREATININE 0.80 11/01/2022 0848   CALCIUM 9.2 11/01/2022 0848   GFRNONAA >60 01/18/2007 1400   GFRAA  01/18/2007 1400    >60        The eGFR has been calculated using the MDRD equation. This calculation has not been validated in all clinical   Lab Results  Component Value Date   HGBA1C 5.5 11/01/2022   HGBA1C 5.4 10/10/2018   Lab Results  Component Value Date   INSULIN 18.4 11/01/2022   Lab Results  Component Value Date   TSH 1.540 11/01/2022   CBC    Component Value Date/Time   WBC 6.2 11/01/2022 0848   WBC 5.5 11/26/2021 1133   RBC 4.61 11/01/2022 0848   RBC 4.65 11/26/2021 1133   HGB 11.5 11/01/2022 0848   HCT 38.2 11/01/2022 0848   PLT 298 11/01/2022 0848   MCV 83 11/01/2022 0848   MCH 24.9 (L) 11/01/2022 0848   MCHC 30.1 (L) 11/01/2022 0848   MCHC 31.6 11/26/2021 1133   RDW 13.8 11/01/2022 0848   Iron Studies No results found for: "IRON", "TIBC", "FERRITIN", "IRONPCTSAT" Lipid Panel     Component Value Date/Time   CHOL 152 11/01/2022 0848   TRIG 59 11/01/2022 0848   HDL 49 11/01/2022 0848   CHOLHDL 3 11/26/2021 1133   VLDL 10.4 11/26/2021 1133   LDLCALC 91 11/01/2022 0848   Hepatic Function Panel     Component Value Date/Time   PROT 7.2 11/01/2022 0848   ALBUMIN 4.2 11/01/2022 0848   AST 14 11/01/2022 0848   ALT 15 11/01/2022 0848   ALKPHOS 74 11/01/2022 0848   BILITOT 0.3 11/01/2022 0848      Component Value Date/Time   TSH 1.540 11/01/2022 0848   Nutritional Lab Results  Component Value Date   VD25OH 48.4 11/01/2022   VD25OH 34.21 11/26/2021   VD25OH 28.99 (L) 11/09/2020     Attestations:   Reviewed by clinician on day of visit: allergies, medications, problem list, medical history, surgical history, family history, social history, and previous encounter notes.  I,Special Puri,acting as a scribe for [employer], DO.,have documented all relevant documentation on the behalf of Thomasene Lot, DO,as directed by  Thomasene Lot, DO while in the presence  of Thomasene Lot, DO.   I, Thomasene Lot, DO, have reviewed all documentation for this visit. The documentation on 12/28/22 for the exam, diagnosis, procedures, and orders are all accurate and complete.

## 2023-01-03 ENCOUNTER — Ambulatory Visit (HOSPITAL_COMMUNITY)
Admission: RE | Admit: 2023-01-03 | Discharge: 2023-01-03 | Disposition: A | Discharge status: Home / Self Care | Payer: 59 | Source: Ambulatory Visit | Attending: Physician Assistant | Admitting: Physician Assistant

## 2023-01-03 ENCOUNTER — Encounter: Payer: Self-pay | Admitting: Physician Assistant

## 2023-01-03 ENCOUNTER — Telehealth: Payer: Self-pay | Admitting: Physician Assistant

## 2023-01-03 ENCOUNTER — Ambulatory Visit: Payer: 59 | Admitting: Physician Assistant

## 2023-01-03 VITALS — BP 136/90 | HR 73 | Temp 97.7°F | Ht 65.0 in | Wt 238.0 lb

## 2023-01-03 DIAGNOSIS — M7989 Other specified soft tissue disorders: Secondary | ICD-10-CM | POA: Diagnosis present

## 2023-01-03 NOTE — Progress Notes (Signed)
Aimee [name] is a 62 y.o. female here for a new problem.  History of Present Illness:   Chief Complaint  Patient presents with   Edema    Pt c/o swelling feet and ankles since Saturday, has been elevating and icing no help.    HPI  Right ankle swelling Patient reports that when she woke up on Saturday she had swelling and tightness to her right foot.  Denies any unusual activity the day before Area is burning and tender on the outer aspect of right foot She has tried elevation, ice and Tylenol without much relief of symptoms Denies chest pain, shortness of breath, coughing, falls, known trauma to area  Denies prior history of blood clot but she is concerned regarding blood clot due to unilateral symptoms Denies recent prolonged travel or immobilization   Past Medical History:  Diagnosis Date   Allergy    Anxiety    Arthritis    Depression    Family history of breast cancer    Family history of colon cancer    Family history of pancreatic cancer    GERD (gastroesophageal reflux disease)    Hypertension    Joint pain    Obstructive sleep apnea 10/06/2022   Rheumatoid arthritis (HCC)      Social History   Tobacco Use   Smoking status: Never   Smokeless tobacco: Never  Vaping Use   Vaping Use: Never used  Substance Use Topics   Alcohol use: No   Drug use: No    Past Surgical History:  Procedure Laterality Date   ABDOMINAL HYSTERECTOMY  2004   CESAREAN SECTION  2003   KNEE ARTHROSCOPY Right 2013    Family History  Problem Relation Age of Onset   Colon cancer Mother 63   Cancer Mother    Diabetes Mother    High blood pressure Mother    Obesity Mother    Heart disease Father        CHF   Hypertension Father    Heart attack Father 26   Stroke Father    Alcoholism Father    Diabetes Sister    Breast cancer Sister 67   Congestive Heart Failure Sister    Heart attack Sister    Pancreatic cancer Sister 55   Heart failure Brother    Heart disease  Brother        stent   Heart Problems Paternal Uncle    Colon cancer Maternal Grandfather        dx >50    No Known Allergies  Current Medications:   Current Outpatient Medications:    albuterol (VENTOLIN HFA) 108 (90 Base) MCG/ACT inhaler, INHALE 2 PUFFS INTO THE LUNGS EVERY 6 HOURS AS NEEDED FOR WHEEZING OR SHORTNESS OF BREATH, Disp: 6.7 g, Rfl: 2   ALPRAZolam (XANAX) 0.5 MG tablet, Take 0.5 mg by mouth 4 (four) times daily as needed. , Disp: , Rfl:    amLODipine (NORVASC) 10 MG tablet, Take 1 tablet (10 mg total) by mouth daily., Disp: 90 tablet, Rfl: 3   beclomethasone (QVAR REDIHALER) 40 MCG/ACT inhaler, Inhale 1 puff into the lungs 2 (two) times daily., Disp: 1 each, Rfl: 1   Cholecalciferol (VITAMIN D3) 25 MCG (1000 UT) capsule, Take 1 capsule (1,000 Units total) by mouth daily., Disp: , Rfl:    FLUoxetine (PROZAC) 40 MG capsule, fluoxetine 40 mg capsule  TAKE 1 CAPSULE BY MOUTH EVERY DAY, Disp: , Rfl:    hydrochlorothiazide (HYDRODIURIL) 25 MG tablet,  Take 1 tablet (25 mg total) by mouth daily., Disp: 30 tablet, Rfl: 0   ibuprofen (ADVIL) 200 MG tablet, Take 400 mg by mouth as needed., Disp: , Rfl:    montelukast (SINGULAIR) 10 MG tablet, TAKE 1 TABLET(10 MG) BY MOUTH AT BEDTIME, Disp: 90 tablet, Rfl: 1   Multiple Vitamin (MULTIVITAMIN) tablet, Take 1 tablet by mouth daily., Disp: , Rfl:    omeprazole (PRILOSEC) 20 MG capsule, Take 20 mg by mouth daily., Disp: , Rfl:    triamcinolone (KENALOG) 0.1 %, SMARTSIG:Sparingly Topical Twice Daily, Disp: , Rfl:    Review of Systems:   ROS Negative unless otherwise specified per HPI.  Vitals:   Vitals:   01/03/23 1128  BP: (!) 136/90  Pulse: 73  Temp: 97.7 F (36.5 C)  TempSrc: Temporal  SpO2: 97%  Weight: 238 lb (108 kg)  Height: 5\' 5"  (1.651 m)     Body mass index is 39.61 kg/m.  Physical Exam:   Physical Exam Vitals and nursing note reviewed.  Constitutional:      General: She is not in acute distress.     Appearance: She is well-developed. She is not ill-appearing or toxic-appearing.  Cardiovascular:     Rate and Rhythm: Normal rate and regular rhythm.     Pulses: Normal pulses.          Dorsalis pedis pulses are 2+ on the right side.       Posterior tibial pulses are 2+ on the right side.     Heart sounds: Normal heart sounds, S1 normal and S2 normal.  Pulmonary:     Effort: Pulmonary effort is normal.     Breath sounds: Normal breath sounds.  Musculoskeletal:     Right lower leg: 1+ Edema present.     Comments: Right foot with generalized swelling to lateral and medial malleolus Tenderness to palpation to lateral aspect of foot and distal aspect of right lower leg No erythema or induration appreciated   Skin:    General: Skin is warm and dry.  Neurological:     Mental Status: She is alert.     GCS: GCS eye subscore is 4. GCS verbal subscore is 5. GCS motor subscore is 6.  Psychiatric:        Speech: Speech normal.        Behavior: Behavior normal. Behavior is cooperative.     Assessment and Plan:   Right leg swelling Unclear etiology Differential includes musculoskeletal injury, blood clot, dependent edema, among others Will obtain stat ultrasound to further evaluate for DVT I have also given her an Ace wrap to trial for compression of this area to see if this helps with swelling  If ultrasound is negative and symptoms persist, will likely obtain either x-ray or referral to sports medicine for further evaluation I discussed with her that if she develops any chest pain, shortness of breath or other concerning symptoms to go to the emergency room    [name], PA-C

## 2023-01-03 NOTE — Patient Instructions (Signed)
It was great to see you!  Trial ibuprofen for your back pain  Keep leg elevated   Ice and ibuprofen, consider wrapping leg with ACE bandage  Someone will reach out to you to schedule your ultrasound  If any chest pain or shortness of breath, go to the ER   Take care,  [name] PA-C

## 2023-01-12 ENCOUNTER — Ambulatory Visit (INDEPENDENT_AMBULATORY_CARE_PROVIDER_SITE_OTHER): Payer: 59 | Admitting: Family Medicine

## 2023-01-12 ENCOUNTER — Encounter (INDEPENDENT_AMBULATORY_CARE_PROVIDER_SITE_OTHER): Payer: Self-pay | Admitting: Family Medicine

## 2023-01-12 VITALS — BP 138/83 | HR 84 | Temp 99.0°F | Ht 65.0 in | Wt 233.0 lb

## 2023-01-12 DIAGNOSIS — F39 Unspecified mood [affective] disorder: Secondary | ICD-10-CM

## 2023-01-12 DIAGNOSIS — Z6838 Body mass index (BMI) 38.0-38.9, adult: Secondary | ICD-10-CM

## 2023-01-12 DIAGNOSIS — I10 Essential (primary) hypertension: Secondary | ICD-10-CM | POA: Diagnosis not present

## 2023-01-12 DIAGNOSIS — E88819 Insulin resistance, unspecified: Secondary | ICD-10-CM | POA: Diagnosis not present

## 2023-01-12 DIAGNOSIS — Z6841 Body Mass Index (BMI) 40.0 and over, adult: Secondary | ICD-10-CM

## 2023-01-12 NOTE — Progress Notes (Signed)
Aimee [name], D.O.  ABFM, ABOM Specializing in Clinical Bariatric Medicine  Office located at: [address] [place], [place]  [address]     Assessment and Plan:   Hypertension, unspecified type Assessment: Condition is stable and essentially at goal. Asymptomatic, no concerns.  Last 3 blood pressure readings in our office are as follows: BP Readings from Last 3 Encounters:  01/12/23 138/83  01/03/23 (!) 136/90  12/28/22 (!) 163/90  - Patient is compliant and tolerant with Norvasc 10 mg daily and Hydrodiuril 25 mg daily. Denies any side effects.  - Endorses that her blood pressure is in the 130s/80s at home.   Plan: - Continue with all anti-hypertensive medications as recommended by PCP/specialist.  - Continue with Prudent nutritional plan and low sodium diet, advance exercise as tolerated.   - We will continue to monitor closely alongside PCP/ specialists.  Pt reminded to also f/up with those individuals as instructed by them.  - We will continue to monitor symptoms as they relate to the her weight loss journey.    Insulin resistance Assessment: Condition is not optimized. Lab Results  Component Value Date   HGBA1C 5.5 11/01/2022   HGBA1C 5.3 11/09/2020   HGBA1C 5.5 11/06/2019   INSULIN 18.4 11/01/2022  - This condition is diet controlled. Patient is not taking any medication for Insulin Resistance.  - Pt is not interested in starting Metformin at the moment and wants to soley focus on her prudent nutritional plan.  Plan: - Juanette will continue to work on weight loss, exercise, via their meal plan we devised to help decrease the risk of progressing to diabetes.  - We will recheck A1c and fasting insulin level in approximately 3 months from last check, or as deemed appropriate.    Mood disorder (HCC)- emotional eating Assessment: Condition is stable. - Denies any SI/HI. Mood is stable. - Cravings and hunger are well controlled.  - Pt endorses that despite  having  work related stress, she has not been doing emotional eating.  - No issues with Prozac 40 mg daily. Denies any side effects.  Plan:  - Continue with med as prescribed by specialist.  - Continue her prudent nutritional plan that is low in simple carbohydrates, saturated fats and trans fats to goal of 5-10% weight loss to achieve significant health benefits.  Pt encouraged to continually advance exercise and cardiovascular fitness as tolerated throughout weight loss journey.  - Reminded patient of the importance of following their prudent nutrition plan and how food can affect mood as well to support emotional wellbeing.  - We will continue to monitor closely.   TREATMENT PLAN FOR OBESITY: BMI 40.0-44.9, adult (HCC)-current bmi 38.77 Morbid obesity (HCC)-start bmi 41.9/date 10/31/21 Assessment:  [name] is here to discuss her progress with her obesity treatment plan along with follow-up of her obesity related diagnoses. See Medical Weight Management Flowsheet for complete bioelectrical impedance results.  Condition is improving.  Biometric data collected today, was reviewed with patient.   Since last office visit on 12/28/22 patient's  Muscle mass has increased by 3.8 lb. Fat mass has decreased by 8.4 lb. Total body water has decreased by 5.8 lb.  Counseling done on how various foods will affect these numbers and how to maximize success  Total lbs lost to date: 18  Total weight loss percentage to date: 7.97   Plan:  - Continue the Category 2 meal plan with 4-6 ounces at lunch and 8-10 ounces at dinner  and journaling her intake.   Behavioral Intervention Additional resources provided today: patient declined Evidence-based interventions for health behavior change were utilized today including the discussion of self monitoring techniques, problem-solving barriers and SMART goal setting techniques.   Regarding patient's less desirable eating habits and patterns, we employed the  technique of small changes.  Pt will specifically work on: continue adherence to prudent nutritional plan for next visit.    Recommended Physical Activity Goals  Toye has been advised to slowly work up to 150 minutes of moderate intensity aerobic activity a week and strengthening exercises 2-3 times per week for cardiovascular health, weight loss maintenance and preservation of muscle mass.   She has agreed to Continue current level of physical activity    FOLLOW UP: Return in about 2 weeks (around 01/26/2023). She was informed of the importance of frequent follow up visits to maximize her success with intensive lifestyle modifications for her multiple health conditions.   Subjective:   Chief complaint: Obesity [name] is here to discuss her progress with her obesity treatment plan. She is on the Category 2 meal plan with 4-6 ounces at lunch and 8-10 ounces at dinner and journaling her intake. and states she is following her eating plan approximately 60% of the time. She states she is walking and doing aerobic classes 60 minutes 5-6 days per week.  Interval History:  [name] is here for a follow up office visit.     Since last office visit:   - Endorses that her clothes fit looser. - Chose to ate some off plan foods on a few occasions.  - The majority of the time, Adaliz worked on increasing her lean protein intake and decreasing her carb intake.  - She has also cut out snacking and despite being stressed, she has not been doing emotional eating.   Review of Systems:  Pertinent positives were addressed with patient today.  Weight Summary and Biometrics   Weight Lost Since Last Visit: 4 lb  Weight Gained Since Last Visit: 0    Vitals Temp: 99 F (37.2 C) BP: 138/83 Pulse Rate: 84 SpO2: 97 %   Anthropometric Measurements Height: 5\' 5"  (1.651 m) Weight: 233 lb (105.7 kg) BMI (Calculated): 38.77 Weight at Last Visit: 237 lb Weight Lost Since Last Visit: 4  lb Weight Gained Since Last Visit: 0 Starting Weight: 251 lb Total Weight Loss (lbs): 18 lb (8.165 kg) Peak Weight: 270 lb   Body Composition  Body Fat %: 46.9 % Fat Mass (lbs): 109.4 lbs Muscle Mass (lbs): 117.8 lbs Total Body Water (lbs): 87 lbs Visceral Fat Rating : 15   Other Clinical Data Fasting: No Labs: No Today's Visit #: 6 Starting Date: 11/01/22    Objective:   PHYSICAL EXAM: Blood pressure 138/83, pulse 84, temperature 99 F (37.2 C), height 5\' 5"  (1.651 m), weight 233 lb (105.7 kg), SpO2 97 %. Body mass index is 38.77 kg/m.  General: Well Developed, well nourished, and in no acute distress.  HEENT: Normocephalic, atraumatic Skin: Warm and dry, cap RF less 2 sec, good turgor Chest:  Normal excursion, shape, no gross abn Respiratory: speaking in full sentences, no conversational dyspnea NeuroM-Sk: Ambulates w/o assistance, moves * 4 Psych: A and O *3, insight good, mood-full  DIAGNOSTIC DATA REVIEWED:  BMET    Component Value Date/Time   NA 144 11/01/2022 0848   K 4.1 11/01/2022 0848   CL 102 11/01/2022 0848   CO2 27 11/01/2022 0848   GLUCOSE 88  11/01/2022 0848   GLUCOSE 76 11/26/2021 1133   BUN 19 11/01/2022 0848   CREATININE 0.80 11/01/2022 0848   CALCIUM 9.2 11/01/2022 0848   GFRNONAA >60 01/18/2007 1400   GFRAA  01/18/2007 1400    >60        The eGFR has been calculated using the MDRD equation. This calculation has not been validated in all clinical   Lab Results  Component Value Date   HGBA1C 5.5 11/01/2022   HGBA1C 5.4 10/10/2018   Lab Results  Component Value Date   INSULIN 18.4 11/01/2022   Lab Results  Component Value Date   TSH 1.540 11/01/2022   CBC    Component Value Date/Time   WBC 6.2 11/01/2022 0848   WBC 5.5 11/26/2021 1133   RBC 4.61 11/01/2022 0848   RBC 4.65 11/26/2021 1133   HGB 11.5 11/01/2022 0848   HCT 38.2 11/01/2022 0848   PLT 298 11/01/2022 0848   MCV 83 11/01/2022 0848   MCH 24.9 (L) 11/01/2022  0848   MCHC 30.1 (L) 11/01/2022 0848   MCHC 31.6 11/26/2021 1133   RDW 13.8 11/01/2022 0848   Iron Studies No results found for: "IRON", "TIBC", "FERRITIN", "IRONPCTSAT" Lipid Panel     Component Value Date/Time   CHOL 152 11/01/2022 0848   TRIG 59 11/01/2022 0848   HDL 49 11/01/2022 0848   CHOLHDL 3 11/26/2021 1133   VLDL 10.4 11/26/2021 1133   LDLCALC 91 11/01/2022 0848   Hepatic Function Panel     Component Value Date/Time   PROT 7.2 11/01/2022 0848   ALBUMIN 4.2 11/01/2022 0848   AST 14 11/01/2022 0848   ALT 15 11/01/2022 0848   ALKPHOS 74 11/01/2022 0848   BILITOT 0.3 11/01/2022 0848      Component Value Date/Time   TSH 1.540 11/01/2022 0848   Nutritional Lab Results  Component Value Date   VD25OH 48.4 11/01/2022   VD25OH 34.21 11/26/2021   VD25OH 28.99 (L) 11/09/2020    Attestations:   Reviewed by clinician on day of visit: allergies, medications, problem list, medical history, surgical history, family history, social history, and previous encounter notes.    I,Special Puri,acting as a Neurosurgeon for [employer], DO.,have documented all relevant documentation on the behalf of Thomasene Lot, DO,as directed by  Thomasene Lot, DO while in the presence of Thomasene Lot, DO.   I, Thomasene Lot, DO, have reviewed all documentation for this visit. The documentation on 01/12/23 for the exam, diagnosis, procedures, and orders are all accurate and complete.

## 2023-01-17 NOTE — Progress Notes (Unsigned)
Office Visit    Patient Name: Aimee [name] Date of Encounter: 01/17/2023  Primary Care Provider:  [name], PA Primary Cardiologist:  None Primary Electrophysiologist: None   Past Medical History    Past Medical History:  Diagnosis Date   Allergy    Anxiety    Arthritis    Depression    Family history of breast cancer    Family history of colon cancer    Family history of pancreatic cancer    GERD (gastroesophageal reflux disease)    Hypertension    Joint pain    Obstructive sleep apnea 10/06/2022   Rheumatoid arthritis [facility])    Past Surgical History:  Procedure Laterality Date   ABDOMINAL HYSTERECTOMY  2004   CESAREAN SECTION  2003   KNEE ARTHROSCOPY Right 2013    Allergies  No Known Allergies   History of Present Illness    Aimee [name]  is a 62 year old female with a PMH of nonobstructive CAD, GERD, HTN, OSA (on CPAP), obesity, anxiety, depression who presents today for preoperative surgical clearance.  [name] was seen initially in 2023 by Dr. Shari Prows for evaluation and management of hypertension.  She was started on HCTZ and continued on amlodipine.  She had completed previous calcium scoring due to history of CAD that was 0.  She had TTE completed with EF of 60 to 65% with grade 1 DD and no RWMA and moderately dilated LA with trivial MVR.  She was last seen by Dr. Shari Prows on 06/03/2022 and blood pressure was still slightly elevated.  She was having difficulty with knee pain and blood pressure elevation was thought to be secondary to pain.  She had no medication changes made at that time.  This problem presents today for preoperative clearance visit alone.  Since last being seen in the office patient reports that she has been doing well with today is well-controlled at 120 over 83 bpm.  She is compliant with her current medication regimen and denies any adverse reactions.  During today's visit we discussed the pathophysiology of congestive heart  failure and left ventricular hypertrophy.  We also discussed the importance of primary prevention and decreasing progression of cardiovascular disease especially in patients with family history of CAD.  Had all questions answered to her stated just her lifestyle when she has recovered from the surgeries..  Patient denies chest pain, palpitations, dyspnea, PND, orthopnea, nausea, vomiting, dizziness, syncope, edema, weight gain, or early satiety.  Home Medications    Current Outpatient Medications  Medication Sig Dispense Refill   albuterol (VENTOLIN HFA) 108 (90 Base) MCG/ACT inhaler INHALE 2 PUFFS INTO THE LUNGS EVERY 6 HOURS AS NEEDED FOR WHEEZING OR SHORTNESS OF BREATH 6.7 g 2   ALPRAZolam (XANAX) 0.5 MG tablet Take 0.5 mg by mouth 4 (four) times daily as needed.      amLODipine (NORVASC) 10 MG tablet Take 1 tablet (10 mg total) by mouth daily. 90 tablet 3   beclomethasone (QVAR REDIHALER) 40 MCG/ACT inhaler Inhale 1 puff into the lungs 2 (two) times daily. 1 each 1   Cholecalciferol (VITAMIN D3) 25 MCG (1000 UT) capsule Take 1 capsule (1,000 Units total) by mouth daily.     FLUoxetine (PROZAC) 40 MG capsule fluoxetine 40 mg capsule  TAKE 1 CAPSULE BY MOUTH EVERY DAY     hydrochlorothiazide (HYDRODIURIL) 25 MG tablet Take 1 tablet (25 mg total) by mouth daily. 30 tablet 0   ibuprofen (ADVIL) 200 MG tablet  Take 400 mg by mouth as needed.     montelukast (SINGULAIR) 10 MG tablet TAKE 1 TABLET(10 MG) BY MOUTH AT BEDTIME 90 tablet 1   Multiple Vitamin (MULTIVITAMIN) tablet Take 1 tablet by mouth daily.     omeprazole (PRILOSEC) 20 MG capsule Take 20 mg by mouth daily.     triamcinolone (KENALOG) 0.1 % SMARTSIG:Sparingly Topical Twice Daily     No current facility-administered medications for this visit.     Review of Systems  Please see the history of present illness.    (+) Bilateral knee pain All other systems reviewed and are otherwise negative except as noted above.  Physical Exam     Wt Readings from Last 3 Encounters:  01/12/23 233 lb (105.7 kg)  01/03/23 238 lb (108 kg)  12/28/22 237 lb (107.5 kg)   ZO:XWRUE were no vitals filed for this visit.,There is no height or weight on file to calculate BMI.  Constitutional:      Appearance: Healthy appearance. Not in distress.  Neck:     Vascular: JVD normal.  Pulmonary:     Effort: Pulmonary effort is normal.     Breath sounds: No wheezing. No rales. Diminished in the bases Cardiovascular:     Normal rate. Regular rhythm. Normal S1. Normal S2.      Murmurs: There is no murmur.  Edema:    Peripheral edema absent.  Abdominal:     Palpations: Abdomen is soft non tender. There is no hepatomegaly.  Skin:    General: Skin is warm and dry.  Neurological:     General: No focal deficit present.     Mental Status: Alert and oriented to person, place and time.     Cranial Nerves: Cranial nerves are intact.  EKG/LABS/ Recent Cardiac Studies    ECG personally reviewed by me today -sinus rhythm with LVH and TWI in leads V3 V4 consistent with previous EKG.  Cardiac Studies & Procedures       ECHOCARDIOGRAM  ECHOCARDIOGRAM COMPLETE 10/26/2021  Narrative ECHOCARDIOGRAM REPORT    Patient Name:   Aimee [name] Date of Exam: 10/26/2021 Medical Rec #:  [id number]      Height:       66.1 in Accession #:    [id number]     Weight:       247.0 lb Date of Birth:  12-01-1960     BSA:          2.191 m Patient Age:    60 years       BP:           126/84 mmHg Patient Gender: F              HR:           74 bpm. Exam Location:  [address]  Procedure: 2D Echo, 3D Echo, Cardiac Doppler and Color Doppler  Indications:    R07.9 Chest Pain  History:        Patient has prior history of Echocardiogram examinations, most recent 08/23/2013. Signs/Symptoms:Chest Pain; Risk Factors:Hypertension and Family History of Coronary Artery Disease. Obesity.  Sonographer:    Farrel Conners RDCS Referring Phys: [name]  IMPRESSIONS   1. Left ventricular ejection fraction, by estimation, is 60 to 65%. The left ventricle has normal function. The left ventricle has no regional wall motion abnormalities. Left ventricular diastolic parameters are consistent with Grade I diastolic dysfunction (impaired relaxation). 2. Right ventricular systolic function is normal. The right ventricular size  is normal. There is normal pulmonary artery systolic pressure. 3. Left atrial size was moderately dilated. 4. The mitral valve is normal in structure. Trivial mitral valve regurgitation. No evidence of mitral stenosis. 5. The aortic valve is tricuspid. Aortic valve regurgitation is not visualized. No aortic stenosis is present. 6. The inferior vena cava is normal in size with greater than 50% respiratory variability, suggesting right atrial pressure of 3 mmHg.  FINDINGS Left Ventricle: Left ventricular ejection fraction, by estimation, is 60 to 65%. The left ventricle has normal function. The left ventricle has no regional wall motion abnormalities. The left ventricular internal cavity size was normal in size. There is no left ventricular hypertrophy. Left ventricular diastolic parameters are consistent with Grade I diastolic dysfunction (impaired relaxation). Normal left ventricular filling pressure.  Right Ventricle: The right ventricular size is normal. No increase in right ventricular wall thickness. Right ventricular systolic function is normal. There is normal pulmonary artery systolic pressure. The tricuspid regurgitant velocity is 1.37 m/s, and with an assumed right atrial pressure of 3 mmHg, the estimated right ventricular systolic pressure is 10.5 mmHg.  Left Atrium: Left atrial size was moderately dilated.  Right Atrium: Right atrial size was normal in size.  Pericardium: There is no evidence of pericardial effusion.  Mitral Valve: The mitral valve is normal in structure. Trivial mitral valve regurgitation.  No evidence of mitral valve stenosis.  Tricuspid Valve: The tricuspid valve is normal in structure. Tricuspid valve regurgitation is trivial. No evidence of tricuspid stenosis.  Aortic Valve: The aortic valve is tricuspid. Aortic valve regurgitation is not visualized. No aortic stenosis is present.  Pulmonic Valve: The pulmonic valve was normal in structure. Pulmonic valve regurgitation is trivial. No evidence of pulmonic stenosis.  Aorta: The aortic root is normal in size and structure.  Venous: The inferior vena cava is normal in size with greater than 50% respiratory variability, suggesting right atrial pressure of 3 mmHg.  IAS/Shunts: No atrial level shunt detected by color flow Doppler.   LEFT VENTRICLE PLAX 2D LVIDd:         5.10 cm   Diastology LVIDs:         2.50 cm   LV e' medial:    11.10 cm/s LV PW:         1.00 cm   LV E/e' medial:  8.6 LV IVS:        1.00 cm   LV e' lateral:   11.90 cm/s LVOT diam:     2.20 cm   LV E/e' lateral: 8.0 LV SV:         99 LV SV Index:   45 LVOT Area:     3.80 cm  3D Volume EF: 3D EF:        73 % LV EDV:       146 ml LV ESV:       40 ml LV SV:        106 ml  RIGHT VENTRICLE RV S prime:     16.40 cm/s TAPSE (M-mode): 2.7 cm  LEFT ATRIUM             Index        RIGHT ATRIUM           Index LA diam:        3.60 cm 1.64 cm/m   RA Area:     15.60 cm LA Vol (A2C):   80.9 ml 36.92 ml/m  RA Volume:   40.10 ml  18.30 ml/m LA Vol (A4C):   69.8 ml 31.85 ml/m LA Biplane Vol: 75.8 ml 34.59 ml/m AORTIC VALVE LVOT Vmax:   139.00 cm/s LVOT Vmean:  85.900 cm/s LVOT VTI:    0.260 m  AORTA Ao Root diam: 3.10 cm Ao Asc diam:  3.40 cm  MITRAL VALVE               TRICUSPID VALVE MV Area (PHT): cm         TR Peak grad:   7.5 mmHg MV Decel Time: 224 msec    TR Vmax:        137.00 cm/s MV E velocity: 95.12 cm/s MV A velocity: 99.28 cm/s  SHUNTS MV E/A ratio:  0.96        Systemic VTI:  0.26 m Systemic Diam: 2.20 cm  Chilton Si  MD Electronically signed by Chilton Si MD Signature Date/Time: 10/26/2021/9:05:17 PM    Final     CT SCANS  CT CARDIAC SCORING (SELF PAY ONLY) 11/09/2021  Addendum 11/09/2021  9:35 AM ADDENDUM REPORT: 11/09/2021 09:32  CLINICAL DATA:  Cardiovascular Disease Risk stratification  EXAM: Coronary Calcium Score  TECHNIQUE: A gated, non-contrast computed tomography scan of the heart was performed using 3mm slice thickness. Axial images were analyzed on a dedicated workstation. Calcium scoring of the coronary arteries was performed using the Agatston method.  FINDINGS: Coronary arteries: Normal origins.  Coronary Calcium Score:  Left main: 0  Left anterior descending artery: 0  Left circumflex artery: 0  Right coronary artery: 0  Total: 0  Percentile: 0  Pericardium: Normal.  Ascending Aorta: Normal caliber.  Non-cardiac: See separate report from [facility].  IMPRESSION: Coronary calcium score of 0. This was 0 percentile for age-, race-, and sex-matched controls.  RECOMMENDATIONS: Coronary artery calcium (CAC) score is a strong predictor of incident coronary heart disease (CHD) and provides predictive information beyond traditional risk factors. CAC scoring is reasonable to use in the decision to withhold, postpone, or initiate statin therapy in intermediate-risk or selected borderline-risk asymptomatic adults (age 60-75 years and LDL-C >=70 to <190 mg/dL) who do not have diabetes or established atherosclerotic cardiovascular disease (ASCVD).* In intermediate-risk (10-year ASCVD risk >=7.5% to <20%) adults or selected borderline-risk (10-year ASCVD risk >=5% to <7.5%) adults in whom a CAC score is measured for the purpose of making a treatment decision the following recommendations have been made:  If CAC=0, it is reasonable to withhold statin therapy and reassess in 5 to 10 years, as long as higher risk conditions are absent (diabetes  mellitus, family history of premature CHD in first degree relatives (males <55 years; females <65 years), cigarette smoking, or LDL >=190 mg/dL).  If CAC is 1 to 99, it is reasonable to initiate statin therapy for patients >=71 years of age.  If CAC is >=100 or >=75th percentile, it is reasonable to initiate statin therapy at any age.  Cardiology referral should be considered for patients with CAC scores >=400 or >=75th percentile.  *2018 AHA/ACC/AACVPR/AAPA/ABC/ACPM/ADA/AGS/APhA/ASPC/NLA/PCNA Guideline on the Management of Blood Cholesterol: A Report of the American College of Cardiology/American Heart Association Task Force on Clinical Practice Guidelines. J Am Coll Cardiol. 2019;73(24):3168-3209.  Armanda Magic, MD   Electronically Signed By: Armanda Magic M.D. On: 11/09/2021 09:32  Narrative EXAM: OVER-READ INTERPRETATION  CT CHEST  The following report is an over-read performed by radiologist Dr. Charlett Nose of [facility] on 11/09/2021. This over-read does not include interpretation of cardiac or coronary anatomy or pathology.  The coronary calcium score interpretation by the cardiologist is attached.  COMPARISON:  None.  FINDINGS: Vascular: Heart is normal size. Aorta normal caliber. Punctate calcification in the aortic arch.  Mediastinum/Nodes: No adenopathy  Lungs/Pleura: No confluent opacities or effusions.  Upper Abdomen: No acute findings  Musculoskeletal: Chest wall soft tissues are unremarkable. No acute bony abnormality.  IMPRESSION: Punctate calcification in the aortic arch.  No acute extra cardiac abnormality.  Electronically Signed: By: Charlett Nose M.D. On: 11/09/2021 09:06          Lab Results  Component Value Date   WBC 6.2 11/01/2022   HGB 11.5 11/01/2022   HCT 38.2 11/01/2022   MCV 83 11/01/2022   PLT 298 11/01/2022   Lab Results  Component Value Date   CREATININE 0.80 11/01/2022   BUN 19 11/01/2022   NA 144  11/01/2022   K 4.1 11/01/2022   CL 102 11/01/2022   CO2 27 11/01/2022   Lab Results  Component Value Date   ALT 15 11/01/2022   AST 14 11/01/2022   ALKPHOS 74 11/01/2022   BILITOT 0.3 11/01/2022   Lab Results  Component Value Date   CHOL 152 11/01/2022   HDL 49 11/01/2022   LDLCALC 91 11/01/2022   TRIG 59 11/01/2022   CHOLHDL 3 11/26/2021    Lab Results  Component Value Date   HGBA1C 5.5 11/01/2022     Assessment & Plan    1.  Preoperative clearance:  -Patient's RCRI score is 0.9%  The patient affirms she has been doing well without any new cardiac symptoms. They are able to achieve 7 METS without cardiac limitations. Therefore, based on ACC/AHA guidelines, the patient would be at acceptable risk for the planned procedure without further cardiovascular testing. The patient was advised that if she develops new symptoms prior to surgery to contact our office to arrange for a follow-up visit, and she verbalized understanding.   2.  Essential hypertension: -Patient's blood pressure today was well-controlled at 120/80 -Continue milligrams daily and HCTZ 25 mg daily  3.  Family history of premature CAD: -Patient had previous calcium score was 0 -Continue primary prevention with blood pressure control lifestyle modification  4.  Mixed hyperlipidemia: -Patient's LDL cholesterol was 91 -Continue lifestyle modification and patient is aware that statin therapy may be indicated in the future  5.  Morbid obesity: -Patient's BMI is 0.37 kg/m -Patient is motivated to increase physical activity following knee replacement procedures.  Disposition: Follow-up with None or APP in 4 months    Medication Adjustments/Labs and Tests Ordered: Current medicines are reviewed at length with the patient today.  Concerns regarding medicines are outlined above.   Signed, Napoleon Form, Leodis Rains, NP 01/17/2023, 6:09 PM [facility]

## 2023-01-18 ENCOUNTER — Encounter: Payer: Self-pay | Admitting: Nurse Practitioner

## 2023-01-18 ENCOUNTER — Ambulatory Visit: Payer: 59 | Attending: Nurse Practitioner | Admitting: Nurse Practitioner

## 2023-01-18 VITALS — BP 120/84 | HR 83 | Ht 65.0 in | Wt 236.6 lb

## 2023-01-18 DIAGNOSIS — Z01810 Encounter for preprocedural cardiovascular examination: Secondary | ICD-10-CM

## 2023-01-18 DIAGNOSIS — I10 Essential (primary) hypertension: Secondary | ICD-10-CM | POA: Diagnosis not present

## 2023-01-18 DIAGNOSIS — E785 Hyperlipidemia, unspecified: Secondary | ICD-10-CM

## 2023-01-18 DIAGNOSIS — G4733 Obstructive sleep apnea (adult) (pediatric): Secondary | ICD-10-CM | POA: Diagnosis not present

## 2023-01-18 DIAGNOSIS — Z6841 Body Mass Index (BMI) 40.0 and over, adult: Secondary | ICD-10-CM

## 2023-01-18 NOTE — Patient Instructions (Addendum)
Medication Instructions:  Your physician recommends that you continue on your current medications as directed. Please refer to the Current Medication list given to you today. *If you need a refill on your cardiac medications before your next appointment, please call your pharmacy*   Lab Work: None Ordered   Testing/Procedures: None Ordered   Follow-Up: At [facility], you and your health needs are our priority.  As part of our continuing mission to provide you with exceptional heart care, we have created designated Provider Care Teams.  These Care Teams include your primary Cardiologist (physician) and Advanced Practice Providers (APPs -  Physician Assistants and Nurse Practitioners) who all work together to provide you with the care you need, when you need it.  We recommend signing up for the patient portal called "MyChart".  Sign up information is provided on this After Visit Summary.  MyChart is used to connect with patients for Virtual Visits (Telemedicine).  Patients are able to view lab/test results, encounter notes, upcoming appointments, etc.  Non-urgent messages can be sent to your provider as well.   To learn more about what you can do with MyChart, go to [email or web address].    Your next appointment:   4 month(s)  Provider:   Dr Jacques Navy   Other Instructions

## 2023-01-26 ENCOUNTER — Ambulatory Visit (INDEPENDENT_AMBULATORY_CARE_PROVIDER_SITE_OTHER): Payer: 59 | Admitting: Family Medicine

## 2023-01-26 ENCOUNTER — Encounter (INDEPENDENT_AMBULATORY_CARE_PROVIDER_SITE_OTHER): Payer: Self-pay | Admitting: Family Medicine

## 2023-01-26 VITALS — BP 127/81 | HR 77 | Temp 98.7°F | Ht 65.0 in | Wt 234.0 lb

## 2023-01-26 DIAGNOSIS — I10 Essential (primary) hypertension: Secondary | ICD-10-CM

## 2023-01-26 DIAGNOSIS — E559 Vitamin D deficiency, unspecified: Secondary | ICD-10-CM

## 2023-01-26 DIAGNOSIS — Z6841 Body Mass Index (BMI) 40.0 and over, adult: Secondary | ICD-10-CM

## 2023-01-26 DIAGNOSIS — E88819 Insulin resistance, unspecified: Secondary | ICD-10-CM

## 2023-01-26 DIAGNOSIS — Z6838 Body mass index (BMI) 38.0-38.9, adult: Secondary | ICD-10-CM

## 2023-01-26 NOTE — Progress Notes (Signed)
Aimee [name], D.O.  ABFM, ABOM Specializing in Clinical Bariatric Medicine  Office located at: [address] [place], [place]  [address]     Assessment and Plan:   Hypertension, unspecified type Assessment: Her blood pressure is stable. No concerns. No issues with Norvasc 10 mg daily and Hydrodiuril 25 mg daily. Denies any adverse effects.   Last 3 blood pressure readings in our office are as follows: BP Readings from Last 3 Encounters:  01/26/23 127/81  01/18/23 120/84  01/12/23 138/83   Plan: Continue with all blood pressure medications as prescribed by specialists.   Ambulatory blood pressure monitoring encouraged.  Reminded patient that if they ever feel poorly in any way, to check their blood pressure and pulse as well. We will continue to monitor closely alongside PCP/ specialists.  Pt reminded to also f/up with those individuals as instructed by them. We will continue to monitor symptoms as they relate to the her weight loss journey.    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 Aimee [name] is not taking any medication for this condition. Her hunger and cravings have been mostly controlled.   Lab Results  Component Value Date   HGBA1C 5.5 11/01/2022   HGBA1C 5.3 11/09/2020   HGBA1C 5.5 11/06/2019   INSULIN 18.4 11/01/2022    Plan: Continue her prudent nutritional plan that is low in simple carbohydrates, saturated fats and trans fats to goal of 5-10% weight loss to achieve significant health benefits.  Pt encouraged to continually advance exercise and cardiovascular fitness as tolerated throughout weight loss journey. Will continue to monitor condition closely alongside PCP.     Vitamin D deficiency Assessment: Condition is not quite at goal. No issues with Cholecalciferol 1,000 units daily. Denies any adverse effects.   Lab Results  Component Value Date   VD25OH 48.4 11/01/2022   VD25OH 34.21 11/26/2021   VD25OH  28.99 (L) 11/09/2020   Plan: Continue with supplement. Weight loss will likely improve availability of vitamin D, thus encouraged Aimee [name] to continue with meal plan and their weight loss efforts to further improve this condition.  Thus, we will need to monitor levels regularly (every 3-4 mo on average) to keep levels within normal limits and prevent over supplementation.   TREATMENT PLAN FOR OBESITY: BMI 40.0-44.9, adult (HCC)-current bmi 38.94 Morbid obesity (HCC)-start bmi 41.9/date 10/31/21 Assessment: Aimee [name] is here to discuss her progress with her obesity treatment plan along with follow-up of her obesity related diagnoses. See Medical Weight Management Flowsheet for complete bioelectrical impedance results.  Condition is not optimized.  Biometric data collected today, was reviewed with patient.   Since last office visit on 01/12/23 patient's  Muscle mass has decreased by 2 lb. Fat mass has increased by 2.8 lb. Total body water has increased by 4 lb.  Counseling done on how various foods will affect these numbers and how to maximize success  Total lbs lost to date: 17  Total weight loss percentage to date: 6.77   Plan: Continue the Category 2 meal plan with 4-6 ounces at lunch and 8-10 ounces at dinner and journaling her intake of 1350-1400 calories and 90+ grams protein daily.   Behavioral Intervention Additional resources provided today:  food journaling log Evidence-based interventions for health behavior change were utilized today including the discussion of self monitoring techniques, problem-solving barriers and SMART goal setting techniques.   Regarding patient's less desirable eating habits and patterns, we employed the technique of small changes.  Pt will specifically work on: journaling her intake and bringing food log for next visit.    Recommended Physical Activity Goals  Aimee [name] has been advised to slowly work up to 150 minutes of moderate intensity aerobic activity a  week and strengthening exercises 2-3 times per week for cardiovascular health, weight loss maintenance and preservation of muscle mass.   She has agreed to Continue current level of physical activity   FOLLOW UP: Return in about 1 month (around 02/27/2023). She was informed of the importance of frequent follow up visits to maximize her success with intensive lifestyle modifications for her multiple health conditions.   Subjective:   Chief complaint: Obesity Aimee [name] is here to discuss her progress with her obesity treatment plan. She is on the the Category 2 Plan with 4-6 ounces at lunch and 8-10 ounces at dinner and journaling her intake and states she is following her eating plan approximately 90% of the time. She states she is doing water aerobics and walking 60 minutes 6 days per week.  Interval History:  Aimee [name] is here for a follow up office visit.     Since last office visit:    - Aimee [name] has been doing well. She ate out just once and is cooking at home the majority of the time. She is making her own cottage cheese bread.    - She endorses that she may have been eating more salty foods.  - She expresses interest in journaling to find where her excess calories are coming from.   Review of Systems:  Pertinent positives were addressed with patient today.  Reviewed by clinician on day of visit: allergies, medications, problem list, medical history, surgical history, family history, social history, and previous encounter notes.  Weight Summary and Biometrics   Weight Lost Since Last Visit: 0  Weight Gained Since Last Visit: 1lb    Vitals Temp: 98.7 F (37.1 C) BP: 127/81 Pulse Rate: 77 SpO2: 98 %   Anthropometric Measurements Height: 5\' 5"  (1.651 m) Weight: 234 lb (106.1 kg) BMI (Calculated): 38.94 Weight at Last Visit: 233lb Weight Lost Since Last Visit: 0 Weight Gained Since Last Visit: 1lb Starting Weight: 251lb Total Weight Loss (lbs): 17 lb (7.711  kg) Peak Weight: 270lb   Body Composition  Body Fat %: 47.9 % Fat Mass (lbs): 112.2 lbs Muscle Mass (lbs): 115.8 lbs Total Body Water (lbs): 91 lbs Visceral Fat Rating : 15   Other Clinical Data Fasting: no Labs: no Today's Visit #: 7 Starting Date: 11/01/22   Objective:   PHYSICAL EXAM: Blood pressure 127/81, pulse 77, temperature 98.7 F (37.1 C), height 5\' 5"  (1.651 m), weight 234 lb (106.1 kg), SpO2 98 %. Body mass index is 38.94 kg/m.  General: Well Developed, well nourished, and in no acute distress.  HEENT: Normocephalic, atraumatic Skin: Warm and dry, cap RF less 2 sec, good turgor Chest:  Normal excursion, shape, no gross abn Respiratory: speaking in full sentences, no conversational dyspnea NeuroM-Sk: Ambulates w/o assistance, moves * 4 Psych: A and O *3, insight good, mood-full  DIAGNOSTIC DATA REVIEWED:  BMET    Component Value Date/Time   NA 144 11/01/2022 0848   K 4.1 11/01/2022 0848   CL 102 11/01/2022 0848   CO2 27 11/01/2022 0848   GLUCOSE 88 11/01/2022 0848   GLUCOSE 76 11/26/2021 1133   BUN 19 11/01/2022 0848   CREATININE 0.80 11/01/2022 0848   CALCIUM 9.2 11/01/2022 0848   GFRNONAA >60 01/18/2007 1400  GFRAA  01/18/2007 1400    >60        The eGFR has been calculated using the MDRD equation. This calculation has not been validated in all clinical   Lab Results  Component Value Date   HGBA1C 5.5 11/01/2022   HGBA1C 5.4 10/10/2018   Lab Results  Component Value Date   INSULIN 18.4 11/01/2022   Lab Results  Component Value Date   TSH 1.540 11/01/2022   CBC    Component Value Date/Time   WBC 6.2 11/01/2022 0848   WBC 5.5 11/26/2021 1133   RBC 4.61 11/01/2022 0848   RBC 4.65 11/26/2021 1133   HGB 11.5 11/01/2022 0848   HCT 38.2 11/01/2022 0848   PLT 298 11/01/2022 0848   MCV 83 11/01/2022 0848   MCH 24.9 (L) 11/01/2022 0848   MCHC 30.1 (L) 11/01/2022 0848   MCHC 31.6 11/26/2021 1133   RDW 13.8 11/01/2022 0848   Iron  Studies No results found for: "IRON", "TIBC", "FERRITIN", "IRONPCTSAT" Lipid Panel     Component Value Date/Time   CHOL 152 11/01/2022 0848   TRIG 59 11/01/2022 0848   HDL 49 11/01/2022 0848   CHOLHDL 3 11/26/2021 1133   VLDL 10.4 11/26/2021 1133   LDLCALC 91 11/01/2022 0848   Hepatic Function Panel     Component Value Date/Time   PROT 7.2 11/01/2022 0848   ALBUMIN 4.2 11/01/2022 0848   AST 14 11/01/2022 0848   ALT 15 11/01/2022 0848   ALKPHOS 74 11/01/2022 0848   BILITOT 0.3 11/01/2022 0848      Component Value Date/Time   TSH 1.540 11/01/2022 0848   Nutritional Lab Results  Component Value Date   VD25OH 48.4 11/01/2022   VD25OH 34.21 11/26/2021   VD25OH 28.99 (L) 11/09/2020    Attestations:   I, Special Puri, acting as a Stage manager for Thomasene Lot, DO., have compiled all relevant documentation for today's office visit on behalf of Thomasene Lot, DO, while in the presence of [employer], DO.  I have reviewed the above documentation for accuracy and completeness, and I agree with the above. Aimee [name], D.O.  The 21st Century Cures Act was signed into law in 2016 which includes the topic of electronic health records.  This provides immediate access to information in MyChart.  This includes consultation notes, operative notes, office notes, lab results and pathology reports.  If you have any questions about what you read please let us know at your next visit so we can discuss your concerns and take corrective action if need be.  We are right here with you.

## 2023-01-30 ENCOUNTER — Other Ambulatory Visit: Payer: Self-pay | Admitting: *Deleted

## 2023-01-30 DIAGNOSIS — I10 Essential (primary) hypertension: Secondary | ICD-10-CM

## 2023-01-30 DIAGNOSIS — Z79899 Other long term (current) drug therapy: Secondary | ICD-10-CM

## 2023-01-30 MED ORDER — HYDROCHLOROTHIAZIDE 25 MG PO TABS
25.0000 mg | ORAL_TABLET | Freq: Every day | ORAL | 3 refills | Status: DC
Start: 2023-01-30 — End: 2023-11-30

## 2023-02-09 ENCOUNTER — Ambulatory Visit (INDEPENDENT_AMBULATORY_CARE_PROVIDER_SITE_OTHER): Payer: 59 | Admitting: Family Medicine

## 2023-02-27 ENCOUNTER — Ambulatory Visit (INDEPENDENT_AMBULATORY_CARE_PROVIDER_SITE_OTHER): Payer: 59 | Admitting: Family Medicine

## 2023-03-08 NOTE — Progress Notes (Addendum)
COVID Vaccine Completed: yes  Date of COVID positive in last 90 days: no  PCP - [name], PA Cardiologist - Robin Searing, NP LOV 01/18/23  Cardiac clearance by Robin Searing 01/18/23 in Epic  Medical clearance by [name] 11/30/22 in chart/Epic   Chest x-ray - 11/28/22 Epic EKG - 01/18/23 Epic Stress Test - very long time ago per pt ECHO - 10/26/21 Epic Cardiac Cath - n/a Pacemaker/ICD device last checked: n/a Spinal Cord Stimulator: n/a  Bowel Prep - no  Sleep Study - very mild per pt CPAP - no  Fasting Blood Sugar - n/a Checks Blood Sugar _____ times a day  Last dose of GLP1 agonist-  N/A GLP1 instructions:  N/A   Last dose of SGLT-2 inhibitors-  N/A SGLT-2 instructions: N/A   Blood Thinner Instructions:  n/a Aspirin Instructions: Last Dose:  Activity level: Can go up a flight of stairs and perform activities of daily living without stopping and without symptoms of chest pain or shortness of breath. Slow with stairs due to knees  Anesthesia review: CAD, HTN, OSA, BP 141/102 and 146/99 at PAT. Pt reports pain and anxiety, no other symptoms. She did take her BP meds this AM. Instructed to monitor at home and call her PCP if still elevated,  Patient denies shortness of breath, fever, cough and chest pain at PAT appointment  Patient verbalized understanding of instructions that were given to them at the PAT appointment. Patient was also instructed that they will need to review over the PAT instructions again at home before surgery.

## 2023-03-08 NOTE — Patient Instructions (Signed)
SURGICAL WAITING ROOM VISITATION  Patients having surgery or a procedure may have no more than 2 support people in the waiting area - these visitors may rotate.    Children under the age of 44 must have an adult with them who is not the patient.  Due to an increase in RSV and influenza rates and associated hospitalizations, children ages 36 and under may not visit patients in [facility] hospitals.  If the patient needs to stay at the hospital during part of their recovery, the visitor guidelines for inpatient rooms apply. Pre-op nurse will coordinate an appropriate time for 1 support person to accompany patient in pre-op.  This support person may not rotate.    Please refer to the [facility] website for the visitor guidelines for Inpatients (after your surgery is over and you are in a regular room).    Your procedure is scheduled on: 03/20/23   Report to [facility] Main Entrance    Report to admitting at 5:15 AM   Call this number if you have problems the morning of surgery [phone]   Do not eat food :After Midnight.   After Midnight you may have the following liquids until 4:15 AM DAY OF SURGERY  Water Non-Citrus Juices (without pulp, NO RED-Apple, White grape, White cranberry) Black Coffee (NO MILK/CREAM OR CREAMERS, sugar ok)  Clear Tea (NO MILK/CREAM OR CREAMERS, sugar ok) regular and decaf                             Plain Jell-O (NO RED)                                           Fruit ices (not with fruit pulp, NO RED)                                     Popsicles (NO RED)                                                               Sports drinks like Gatorade (NO RED)    The day of surgery:  Drink ONE (1) Pre-Surgery Clear Ensure at 4:15 AM the morning of surgery. Drink in one sitting. Do not sip.  This drink was given to you during your hospital  pre-op appointment visit. Nothing else to drink after completing the  Pre-Surgery Clear Ensure.          If  you have questions, please contact your surgeon's office.   FOLLOW BOWEL PREP AND ANY ADDITIONAL PRE OP INSTRUCTIONS YOU RECEIVED FROM YOUR SURGEON'S OFFICE!!!     Oral Hygiene is also important to reduce your risk of infection.                                    Remember - BRUSH YOUR TEETH THE MORNING OF SURGERY WITH YOUR REGULAR TOOTHPASTE  DENTURES WILL BE REMOVED PRIOR TO SURGERY PLEASE DO NOT APPLY "Poly grip" OR ADHESIVES!!!  Take these medicines the morning of surgery with A SIP OF WATER: Albuterol, Alprazolam, Fluoxetine, Omeprazole                               You may not have any metal on your body including hair pins, jewelry, and body piercing             Do not wear make-up, lotions, powders, perfumes, or deodorant  Do not wear nail polish including gel and S&S, artificial/acrylic nails, or any other type of covering on natural nails including finger and toenails. If you have artificial nails, gel coating, etc. that needs to be removed by a nail salon please have this removed prior to surgery or surgery may need to be canceled/ delayed if the surgeon/ anesthesia feels like they are unable to be safely monitored.   Do not shave  48 hours prior to surgery.    Do not bring valuables to the hospital. [facility] IS NOT             RESPONSIBLE   FOR VALUABLES.   Contacts, glasses, dentures or bridgework may not be worn into surgery.   Bring small overnight bag day of surgery.   DO NOT BRING YOUR HOME MEDICATIONS TO THE HOSPITAL. PHARMACY WILL DISPENSE MEDICATIONS LISTED ON YOUR MEDICATION LIST TO YOU DURING YOUR ADMISSION IN THE HOSPITAL!   Special Instructions: Bring a copy of your healthcare power of attorney and living will documents the day of surgery if you haven't scanned them before.              Please read over the following fact sheets you were given: IF YOU HAVE QUESTIONS ABOUT YOUR PRE-OP INSTRUCTIONS PLEASE CALL [phone] [place]    If you received a  COVID test during your pre-op visit  it is requested that you wear a mask when out in public, stay away from anyone that may not be feeling well and notify your surgeon if you develop symptoms. If you test positive for Covid or have been in contact with anyone that has tested positive in the last 10 days please notify you surgeon.      Pre-operative 5 CHG Bath Instructions   You can play a key role in reducing the risk of infection after surgery. Your skin needs to be as free of germs as possible. You can reduce the number of germs on your skin by washing with CHG (chlorhexidine gluconate) soap before surgery. CHG is an antiseptic soap that kills germs and continues to kill germs even after washing.   DO NOT use if you have an allergy to chlorhexidine/CHG or antibacterial soaps. If your skin becomes reddened or irritated, stop using the CHG and notify one of our RNs at [phone].   Please shower with the CHG soap starting 4 days before surgery using the following schedule:     Please keep in mind the following:  DO NOT shave, including legs and underarms, starting the day of your first shower.   You may shave your face at any point before/day of surgery.  Place clean sheets on your bed the day you start using CHG soap. Use a clean washcloth (not used since being washed) for each shower. DO NOT sleep with pets once you start using the CHG.   CHG Shower Instructions:  If you choose to wash your hair and private area, wash first with your normal shampoo/soap.  After  you use shampoo/soap, rinse your hair and body thoroughly to remove shampoo/soap residue.  Turn the water OFF and apply about 3 tablespoons (45 ml) of CHG soap to a CLEAN washcloth.  Apply CHG soap ONLY FROM YOUR NECK DOWN TO YOUR TOES (washing for 3-5 minutes)  DO NOT use CHG soap on face, private areas, open wounds, or sores.  Pay special attention to the area where your surgery is being performed.  If you are having back  surgery, having someone wash your back for you may be helpful. Wait 2 minutes after CHG soap is applied, then you may rinse off the CHG soap.  Pat dry with a clean towel  Put on clean clothes/pajamas   If you choose to wear lotion, please use ONLY the CHG-compatible lotions on the back of this paper.     Additional instructions for the day of surgery: DO NOT APPLY any lotions, deodorants, cologne, or perfumes.   Put on clean/comfortable clothes.  Brush your teeth.  Ask your nurse before applying any prescription medications to the skin.      CHG Compatible Lotions   Aveeno Moisturizing lotion  Cetaphil Moisturizing Cream  Cetaphil Moisturizing Lotion  Clairol Herbal Essence Moisturizing Lotion, Dry Skin  Clairol Herbal Essence Moisturizing Lotion, Extra Dry Skin  Clairol Herbal Essence Moisturizing Lotion, Normal Skin  Curel Age Defying Therapeutic Moisturizing Lotion with Alpha Hydroxy  Curel Extreme Care Body Lotion  Curel Soothing Hands Moisturizing Hand Lotion  Curel Therapeutic Moisturizing Cream, Fragrance-Free  Curel Therapeutic Moisturizing Lotion, Fragrance-Free  Curel Therapeutic Moisturizing Lotion, Original Formula  Eucerin Daily Replenishing Lotion  Eucerin Dry Skin Therapy Plus Alpha Hydroxy Crme  Eucerin Dry Skin Therapy Plus Alpha Hydroxy Lotion  Eucerin Original Crme  Eucerin Original Lotion  Eucerin Plus Crme Eucerin Plus Lotion  Eucerin TriLipid Replenishing Lotion  Keri Anti-Bacterial Hand Lotion  Keri Deep Conditioning Original Lotion Dry Skin Formula Softly Scented  Keri Deep Conditioning Original Lotion, Fragrance Free Sensitive Skin Formula  Keri Lotion Fast Absorbing Fragrance Free Sensitive Skin Formula  Keri Lotion Fast Absorbing Softly Scented Dry Skin Formula  Keri Original Lotion  Keri Skin Renewal Lotion Keri Silky Smooth Lotion  Keri Silky Smooth Sensitive Skin Lotion  Nivea Body Creamy Conditioning Oil  Nivea Body Extra Enriched  Lotion  Nivea Body Original Lotion  Nivea Body Sheer Moisturizing Lotion Nivea Crme  Nivea Skin Firming Lotion  NutraDerm 30 Skin Lotion  NutraDerm Skin Lotion  NutraDerm Therapeutic Skin Cream  NutraDerm Therapeutic Skin Lotion  ProShield Protective Hand Cream  Provon moisturizing lotion   Incentive Spirometer  An incentive spirometer is a tool that can help keep your lungs clear and active. This tool measures how well you are filling your lungs with each breath. Taking long deep breaths may help reverse or decrease the chance of developing breathing (pulmonary) problems (especially infection) following: A long period of time when you are unable to move or be active. BEFORE THE PROCEDURE  If the spirometer includes an indicator to show your best effort, your nurse or respiratory therapist will set it to a desired goal. If possible, sit up straight or lean slightly forward. Try not to slouch. Hold the incentive spirometer in an upright position. INSTRUCTIONS FOR USE  Sit on the edge of your bed if possible, or sit up as far as you can in bed or on a chair. Hold the incentive spirometer in an upright position. Breathe out normally. Place the mouthpiece in your mouth and seal  your lips tightly around it. Breathe in slowly and as deeply as possible, raising the piston or the ball toward the top of the column. Hold your breath for 3-5 seconds or for as long as possible. Allow the piston or ball to fall to the bottom of the column. Remove the mouthpiece from your mouth and breathe out normally. Rest for a few seconds and repeat Steps 1 through 7 at least 10 times every 1-2 hours when you are awake. Take your time and take a few normal breaths between deep breaths. The spirometer may include an indicator to show your best effort. Use the indicator as a goal to work toward during each repetition. After each set of 10 deep breaths, practice coughing to be sure your lungs are clear. If you have  an incision (the cut made at the time of surgery), support your incision when coughing by placing a pillow or rolled up towels firmly against it. Once you are able to get out of bed, walk around indoors and cough well. You may stop using the incentive spirometer when instructed by your caregiver.  RISKS AND COMPLICATIONS Take your time so you do not get dizzy or light-headed. If you are in pain, you may need to take or ask for pain medication before doing incentive spirometry. It is harder to take a deep breath if you are having pain. AFTER USE Rest and breathe slowly and easily. It can be helpful to keep track of a log of your progress. Your caregiver can provide you with a simple table to help with this. If you are using the spirometer at home, follow these instructions: SEEK MEDICAL CARE IF:  You are having difficultly using the spirometer. You have trouble using the spirometer as often as instructed. Your pain medication is not giving enough relief while using the spirometer. You develop fever of 100.5 F (38.1 C) or higher. SEEK IMMEDIATE MEDICAL CARE IF:  You cough up bloody sputum that had not been present before. You develop fever of 102 F (38.9 C) or greater. You develop worsening pain at or near the incision site. MAKE SURE YOU:  Understand these instructions. Will watch your condition. Will get help right away if you are not doing well or get worse. Document Released: 12/12/2006 Document Revised: 10/24/2011 Document Reviewed: 02/12/2007 [facility] Patient Information 2014 [place], [place].   ________________________________________________________________________

## 2023-03-09 ENCOUNTER — Encounter (HOSPITAL_COMMUNITY): Payer: Self-pay

## 2023-03-09 ENCOUNTER — Encounter (HOSPITAL_COMMUNITY)
Admission: RE | Admit: 2023-03-09 | Discharge: 2023-03-09 | Disposition: A | Discharge status: Home / Self Care | Payer: 59 | Source: Ambulatory Visit | Attending: Orthopedic Surgery | Admitting: Orthopedic Surgery

## 2023-03-09 ENCOUNTER — Other Ambulatory Visit: Payer: Self-pay

## 2023-03-09 VITALS — BP 146/99 | HR 61 | Temp 98.9°F | Resp 14 | Ht 64.5 in | Wt 237.0 lb

## 2023-03-09 DIAGNOSIS — Z01818 Encounter for other preprocedural examination: Secondary | ICD-10-CM

## 2023-03-09 DIAGNOSIS — G4733 Obstructive sleep apnea (adult) (pediatric): Secondary | ICD-10-CM | POA: Diagnosis not present

## 2023-03-09 DIAGNOSIS — I10 Essential (primary) hypertension: Secondary | ICD-10-CM | POA: Insufficient documentation

## 2023-03-09 DIAGNOSIS — Z01812 Encounter for preprocedural laboratory examination: Secondary | ICD-10-CM | POA: Insufficient documentation

## 2023-03-09 DIAGNOSIS — M069 Rheumatoid arthritis, unspecified: Secondary | ICD-10-CM | POA: Insufficient documentation

## 2023-03-09 DIAGNOSIS — M1712 Unilateral primary osteoarthritis, left knee: Secondary | ICD-10-CM | POA: Diagnosis not present

## 2023-03-09 HISTORY — DX: Cardiac murmur, unspecified: R01.1

## 2023-03-09 LAB — BASIC METABOLIC PANEL
Anion gap: 8 (ref 5–15)
BUN: 23 mg/dL — ABNORMAL HIGH (ref 6–20)
CO2: 29 mmol/L (ref 22–32)
Calcium: 9 mg/dL (ref 8.9–10.3)
Chloride: 102 mmol/L (ref 98–111)
Creatinine, Ser: 0.83 mg/dL (ref 0.44–1.00)
GFR, Estimated: >60 mL/min (ref >60)
Glucose, Bld: 93 mg/dL (ref 70–99)
Potassium: 4 mmol/L (ref 3.5–5.1)
Sodium: 139 mmol/L (ref 135–145)

## 2023-03-09 LAB — CBC
HCT: 37.7 % (ref 36.0–46.0)
Hemoglobin: 11.5 g/dL — ABNORMAL LOW (ref 12.0–15.0)
MCH: 25.1 pg — ABNORMAL LOW (ref 26.0–34.0)
MCHC: 30.5 g/dL (ref 30.0–36.0)
MCV: 82.1 fL (ref 80.0–100.0)
Platelets: 298 10*3/uL (ref 150–400)
RBC: 4.59 MIL/uL (ref 3.87–5.11)
RDW: 14.4 % (ref 11.5–15.5)
WBC: 6.5 10*3/uL (ref 4.0–10.5)
nRBC: 0 % (ref 0.0–0.2)

## 2023-03-09 LAB — SURGICAL PCR SCREEN
MRSA, PCR: NEGATIVE
Staphylococcus aureus: POSITIVE — AB

## 2023-03-09 NOTE — Progress Notes (Signed)
[facility] results routed to Dr. Aluisio 

## 2023-03-10 NOTE — Progress Notes (Signed)
Anesthesia Chart Review   Case: [id number] Date/Time: 03/20/23 0700   Procedure: TOTAL KNEE ARTHROPLASTY (Left: Knee)   Anesthesia type: Choice   Pre-op diagnosis: Left knee osteoarthritis   Location: [facility] ROOM 09 / [facility]   Surgeons: Ollen Gross, MD       DISCUSSION:62 y.o. never smoker with h/o HTN, RA, OSA, left knee OA scheduled for above procedure 03/20/2023 with Dr. Ollen Gross.   Pt seen by cardiology 01/18/2023. Per OV note, "-Patient's RCRI score is 0.9%   The patient affirms she has been doing well without any new cardiac symptoms. They are able to achieve 7 METS without cardiac limitations. Therefore, based on ACC/AHA guidelines, the patient would be at acceptable risk for the planned procedure without further cardiovascular testing. The patient was advised that if she develops new symptoms prior to surgery to contact our office to arrange for a follow-up visit, and she verbalized understanding. "  Clearance from PCP received which states pt is optimized for planned procedure and is low risk.  VS: BP (!) 146/99   Pulse 61   Temp 37.2 C (Oral)   Resp 14   Ht 5' 4.5" (1.638 m)   Wt 107.5 kg   SpO2 100%   BMI 40.05 kg/m   PROVIDERS: [name], PA is PCP    LABS: Labs reviewed: Acceptable for surgery. (all labs ordered are listed, but only abnormal results are displayed)  Labs Reviewed  SURGICAL PCR SCREEN - Abnormal; Notable for the following components:      Result Value   Staphylococcus aureus POSITIVE (*)    All other components within normal limits  BASIC METABOLIC PANEL - Abnormal; Notable for the following components:   BUN 23 (*)    All other components within normal limits  CBC - Abnormal; Notable for the following components:   Hemoglobin 11.5 (*)    MCH 25.1 (*)    All other components within normal limits     IMAGES:   EKG:   CV: Echo 10/26/2021 1. Left ventricular ejection fraction, by estimation, is 60 to 65%. The  left ventricle  has normal function. The left ventricle has no regional  wall motion abnormalities. Left ventricular diastolic parameters are  consistent with Grade I diastolic  dysfunction (impaired relaxation).   2. Right ventricular systolic function is normal. The right ventricular  size is normal. There is normal pulmonary artery systolic pressure.   3. Left atrial size was moderately dilated.   4. The mitral valve is normal in structure. Trivial mitral valve  regurgitation. No evidence of mitral stenosis.   5. The aortic valve is tricuspid. Aortic valve regurgitation is not  visualized. No aortic stenosis is present.   6. The inferior vena cava is normal in size with greater than 50%  respiratory variability, suggesting right atrial pressure of 3 mmHg.  Past Medical History:  Diagnosis Date   Allergy    Anxiety    Arthritis    Depression    Family history of breast cancer    Family history of colon cancer    Family history of pancreatic cancer    GERD (gastroesophageal reflux disease)    Heart murmur    per PCP   Hypertension    Joint pain    Obstructive sleep apnea 10/06/2022   Rheumatoid arthritis [facility])     Past Surgical History:  Procedure Laterality Date   ABDOMINAL HYSTERECTOMY  2004   arm surgery Left    CESAREAN SECTION  2003   KNEE ARTHROSCOPY Right 2013    MEDICATIONS:  albuterol (VENTOLIN HFA) 108 (90 Base) MCG/ACT inhaler   ALPRAZolam (XANAX) 0.5 MG tablet   amLODipine (NORVASC) 10 MG tablet   beclomethasone (QVAR REDIHALER) 40 MCG/ACT inhaler   Cholecalciferol (VITAMIN D3) 25 MCG (1000 UT) capsule   ferrous sulfate 325 (65 FE) MG tablet   FLUoxetine (PROZAC) 40 MG capsule   hydrochlorothiazide (HYDRODIURIL) 25 MG tablet   ibuprofen (ADVIL) 200 MG tablet   montelukast (SINGULAIR) 10 MG tablet   Multiple Vitamin (MULTIVITAMIN) tablet   Naproxen Sod-diphenhydrAMINE (ALEVE PM) 220-25 MG TABS   omeprazole (PRILOSEC) 20 MG capsule   triamcinolone (KENALOG) 0.1 %    No current facility-administered medications for this encounter.     Jodell Cipro Ward, PA-C [facility] [phone]

## 2023-03-10 NOTE — Anesthesia Preprocedure Evaluation (Addendum)
Anesthesia Evaluation  Patient identified by MRN, date of birth, ID band  Reviewed: Allergy & Precautions, NPO status , Patient's Chart, lab work & pertinent test results  Airway Mallampati: II  TM Distance: >3 FB Neck ROM: Full    Dental no notable dental hx. (+) Teeth Intact, Dental Advisory Given   Pulmonary sleep apnea (does not use) and Continuous Positive Airway Pressure Ventilation    Pulmonary exam normal breath sounds clear to auscultation       Cardiovascular hypertension, Normal cardiovascular exam Rhythm:Regular Rate:Normal  10/2021 echo  1. Left ventricular ejection fraction, by estimation, is 60 to 65%. The  left ventricle has normal function. The left ventricle has no regional  wall motion abnormalities. Left ventricular diastolic parameters are  consistent with Grade I diastolic  dysfunction (impaired relaxation).   2. Right ventricular systolic function is normal. The right ventricular  size is normal. There is normal pulmonary artery systolic pressure.   3. Left atrial size was moderately dilated.   4. The mitral valve is normal in structure. Trivial mitral valve  regurgitation. No evidence of mitral stenosis.   5. The aortic valve is tricuspid. Aortic valve regurgitation is not  visualized. No aortic stenosis is present.   6. The inferior vena cava is normal in size with greater than 50%  respiratory variability, suggesting right atrial pressure of 3 mmHg     Neuro/Psych   Anxiety Depression       GI/Hepatic Neg liver ROS,GERD  Medicated and Controlled,,  Endo/Other    Renal/GU Lab Results      Component                Value               Date                      K                        4.0                 03/09/2023                     CREATININE               0.83                03/09/2023                     Musculoskeletal  (+) Arthritis , Osteoarthritis,    Abdominal  (+) + obese  Peds   Hematology Lab Results      Component                Value               Date                          HGB                      11.5 (L)            03/09/2023               PLT                      298  03/09/2023              Anesthesia Other Findings ? Seasonal allergies asthma  Reproductive/Obstetrics                             Anesthesia Physical Anesthesia Plan  ASA: 3  Anesthesia Plan: Spinal and Regional   Post-op Pain Management: Minimal or no pain anticipated   Induction:   PONV Risk Score and Plan: Midazolam, Ondansetron, Treatment may vary due to age or medical condition and Propofol infusion  Airway Management Planned: Nasal Cannula and Natural Airway  Additional Equipment: None  Intra-op Plan:   Post-operative Plan:   Informed Consent: I have reviewed the patients History and Physical, chart, labs and discussed the procedure including the risks, benefits and alternatives for the proposed anesthesia with the patient or authorized representative who has indicated his/her understanding and acceptance.     Dental advisory given  Plan Discussed with: CRNA  Anesthesia Plan Comments: (See PAT note 03/09/2023  Spinal w L Adductor Canal block)       Anesthesia Quick Evaluation

## 2023-03-13 ENCOUNTER — Encounter (INDEPENDENT_AMBULATORY_CARE_PROVIDER_SITE_OTHER): Payer: Self-pay

## 2023-03-13 ENCOUNTER — Ambulatory Visit (INDEPENDENT_AMBULATORY_CARE_PROVIDER_SITE_OTHER): Payer: 59 | Admitting: Family Medicine

## 2023-03-13 NOTE — H&P (Signed)
TOTAL KNEE ADMISSION H&P  Patient is being admitted for left total knee arthroplasty.  Subjective:  Chief Complaint: Left knee pain.  HPI: Aimee [name], 62 y.o. female has a history of pain and functional disability in the left knee due to arthritis and has failed non-surgical conservative treatments for greater than 12 weeks to include corticosteriod injections, viscosupplementation injections, use of assistive devices, and activity modification. Onset of symptoms was gradual, starting 5 years ago with gradually worsening course since that time. The patient noted no past surgery on the left knee.  Patient currently rates pain in the left knee at 8 out of 10 with activity. Patient has worsening of pain with activity and weight bearing and pain that interferes with activities of daily living. Patient has evidence of subchondral sclerosis, periarticular osteophytes, and joint space narrowing by imaging studies. There is no active infection.  Patient Active Problem List   Diagnosis Date Noted   BMI 39.0-39.9,adult 12/14/2022   Insulin resistance 11/15/2022   Other fatigue 11/01/2022   SOB (shortness of breath) on exertion 11/01/2022   BMI 40.0-44.9, adult (HCC)-current bmi 41.9 11/01/2022   Mood disorder (HCC)- emotional eating 11/01/2022   OSA on CPAP 11/01/2022   Vitamin D deficiency 11/01/2022   Obstructive sleep apnea 10/06/2022   Excessive daytime sleepiness 08/18/2022   Morbid obesity (HCC) 08/18/2022   Snoring 07/20/2022   Leg cramping 07/20/2022   Family history of pancreatic cancer    Family history of breast cancer    Family history of colon cancer    Obesity 08/12/2020   Degenerative disc disease, cervical 11/06/2019   Genetic testing 06/17/2019   Degenerative arthritis of knee, bilateral 02/05/2019   Depression, major, single episode, moderate (HCC) 10/10/2018   PVC (premature ventricular contraction) 01/11/2018   Knee osteoarthritis 09/25/2017   AC separation, left,  initial encounter 05/26/2017   Acute left-sided low back pain without sciatica 05/26/2017   Hypertension 04/06/2017   Anxiety 04/06/2017   Arthritis 04/06/2017    Past Medical History:  Diagnosis Date   Allergy    Anxiety    Arthritis    Depression    Family history of breast cancer    Family history of colon cancer    Family history of pancreatic cancer    GERD (gastroesophageal reflux disease)    Heart murmur    per PCP   Hypertension    Joint pain    Obstructive sleep apnea 10/06/2022   Rheumatoid arthritis (HCC)     Past Surgical History:  Procedure Laterality Date   ABDOMINAL HYSTERECTOMY  2004   arm surgery Left    CESAREAN SECTION  2003   KNEE ARTHROSCOPY Right 2013    Prior to Admission medications   Medication Sig Start Date End Date Taking? Authorizing Provider  albuterol (VENTOLIN HFA) 108 (90 Base) MCG/ACT inhaler INHALE 2 PUFFS INTO THE LUNGS EVERY 6 HOURS AS NEEDED FOR WHEEZING OR SHORTNESS OF BREATH 12/02/22  Yes [name], PA  ALPRAZolam Prudy Feeler) 0.5 MG tablet Take 0.5 mg by mouth 4 (four) times daily as needed for anxiety. 03/22/17  Yes [provider]  amLODipine (NORVASC) 10 MG tablet Take 1 tablet (10 mg total) by mouth daily. Patient taking differently: Take 10 mg by mouth every evening. 06/07/22  Yes Meriam Sprague, MD  beclomethasone (QVAR REDIHALER) 40 MCG/ACT inhaler Inhale 1 puff into the lungs 2 (two) times daily. 11/16/22  Yes [name], PA  Cholecalciferol (VITAMIN D3) 25 MCG (1000 UT) capsule Take  1 capsule (1,000 Units total) by mouth daily. 11/15/22  Yes Opalski, Gavin Pound, DO  ferrous sulfate 325 (65 FE) MG tablet Take 325 mg by mouth daily.   Yes [provider]  FLUoxetine (PROZAC) 40 MG capsule Take 40 mg by mouth daily.   Yes [provider]  hydrochlorothiazide (HYDRODIURIL) 25 MG tablet Take 1 tablet (25 mg total) by mouth daily. 01/30/23 01/30/24 Yes Meriam Sprague, MD  ibuprofen (ADVIL) 200  MG tablet Take 400 mg by mouth every 6 (six) hours as needed for moderate pain.   Yes [provider]  montelukast (SINGULAIR) 10 MG tablet TAKE 1 TABLET(10 MG) BY MOUTH AT BEDTIME 07/04/22  Yes Worley, Rollins, PA  Multiple Vitamin (MULTIVITAMIN) tablet Take 1 tablet by mouth daily.   Yes [provider]  Naproxen Sod-diphenhydrAMINE (ALEVE PM) 220-25 MG TABS Take 2 tablets by mouth at bedtime as needed (sleep).   Yes [provider]  omeprazole (PRILOSEC) 20 MG capsule Take 20 mg by mouth daily. 09/30/22  Yes [provider]  triamcinolone (KENALOG) 0.1 % Apply 1 Application topically daily. 07/13/20  Yes [provider]    No Known Allergies  Social History   Socioeconomic History   Marital status: Married    Spouse name: Not on file   Number of children: Not on file   Years of education: Not on file   Highest education level: Not on file  Occupational History   Not on file  Tobacco Use   Smoking status: Never   Smokeless tobacco: Never  Vaping Use   Vaping status: Never Used  Substance and Sexual Activity   Alcohol use: No   Drug use: No   Sexual activity: Yes    Birth control/protection: Surgical  Other Topics Concern   Not on file  Social History Narrative   Works for the [employer] -- working there for 24 years   Married, 2 kids (34 and 15) and 1 grandson   Social Determinants of Corporate investment banker Strain: Not on file  Food Insecurity: Not on file  Transportation Needs: Not on file  Physical Activity: Not on file  Stress: Not on file  Social Connections: Not on file  Intimate Partner Violence: Not on file    Tobacco Use: Low Risk  (03/09/2023)   Patient History    Smoking Tobacco Use: Never    Smokeless Tobacco Use: Never    Passive Exposure: Not on file   Social History   Substance and Sexual Activity  Alcohol Use No    Family History  Problem Relation Age of Onset   Colon cancer Mother 26    Cancer Mother    Diabetes Mother    High blood pressure Mother    Obesity Mother    Heart disease Father        CHF   Hypertension Father    Heart attack Father 57   Stroke Father    Alcoholism Father    Diabetes Sister    Breast cancer Sister 85   Congestive Heart Failure Sister    Heart attack Sister    Pancreatic cancer Sister 82   Heart failure Brother    Heart disease Brother        stent   Heart Problems Paternal Uncle    Colon cancer Maternal Grandfather        dx >50    ROS  Objective:  Physical Exam: - Well-developed female alert and oriented in no  apparent distress -Both hips demonstrate normal range of motion with no discomfort. - Right knee No effusion. Range of motion is 10 to 110. Moderate crepitus on range of motion with tenderness, medial greater than lateral. No instability noted. - Left knee No effusion. Range of motion is 10 to 110. Marked crepitus on range of motion. Tenderness, medial greater than lateral. No instability noted. - Gait pattern is significantly antalgic on the left, greater than right.  IMAGING: Radiographs (AP and lateral) of both knees from the patient's last visit demonstrate end-stage arthritic changes in the medial and patellofemoral compartments of both knees. Both knees have large bone spurs. The left knee shows more bone-on-bone contact than the right knee. The lateral view of the left knee reveals bone on bone and a large spur behind the kneecap. The right knee also shows a large spur and bone-on-bone contact behind the patella.   Assessment/Plan:  End stage arthritis, left knee   The patient history, physical examination, clinical judgment of the provider and imaging studies are consistent with end stage degenerative joint disease of the left knee and total knee arthroplasty is deemed medically necessary. The treatment options including medical management, injection therapy arthroscopy and arthroplasty were discussed at length.  The risks and benefits of total knee arthroplasty were presented and reviewed. The risks due to aseptic loosening, infection, stiffness, patella tracking problems, thromboembolic complications and other imponderables were discussed. The patient acknowledged the explanation, agreed to proceed with the plan and consent was signed. Patient is being admitted for inpatient treatment for surgery, pain control, PT, OT, prophylactic antibiotics, VTE prophylaxis, progressive ambulation and ADLs and discharge planning. The patient is planning to be discharged  home with OPPT scheduled .   Patient's anticipated LOS is less than 2 midnights, meeting these requirements: - Younger than 78 - Lives within 1 hour of care - Has a competent adult at home to recover with post-op recover - NO history of  - Chronic pain requiring opiods  - Diabetes  - Coronary Artery Disease  - Heart failure  - Heart attack  - Stroke  - DVT/VTE  - Cardiac arrhythmia  - Respiratory Failure/COPD  - Renal failure  - Anemia  - Advanced Liver disease   Therapy Plans: [facility] Disposition: Home w/ husband Timothy Planned DVT Prophylaxis: Aspirin Protocol DME Needed: Ordered today PCP: Dr. Bufford Buttner, Medically cleared Low Risk 11/30/2022 Cardiology: Neila Gear NP - Acceptable Risk 01/18/2023 TXA: IV Allergies: NKDA Anesthesia Concerns: None BMI: 38.3 Last HgbA1c: Not diabetic Metal Allergy: None Pharmacy: [facility]  - Patient was instructed on what medications to stop prior to surgery. - Follow-up visit in 2 weeks with Dr. Lequita Halt - Begin physical therapy following surgery - Pre-operative lab work as pre-surgical testing - Prescriptions will be provided in hospital at time of discharge  Weston Brass, PA-C Orthopedic Surgery [facility] [place]

## 2023-03-15 ENCOUNTER — Ambulatory Visit (INDEPENDENT_AMBULATORY_CARE_PROVIDER_SITE_OTHER): Payer: 59 | Admitting: Family Medicine

## 2023-03-20 ENCOUNTER — Observation Stay (HOSPITAL_COMMUNITY)
Admission: RE | Admit: 2023-03-20 | Discharge: 2023-03-21 | Disposition: A | Discharge status: Home / Self Care | Payer: 59 | Attending: Orthopedic Surgery | Admitting: Orthopedic Surgery

## 2023-03-20 ENCOUNTER — Other Ambulatory Visit: Payer: Self-pay

## 2023-03-20 ENCOUNTER — Ambulatory Visit (HOSPITAL_BASED_OUTPATIENT_CLINIC_OR_DEPARTMENT_OTHER): Payer: 59 | Admitting: Anesthesiology

## 2023-03-20 ENCOUNTER — Ambulatory Visit (HOSPITAL_COMMUNITY): Payer: 59 | Admitting: Physician Assistant

## 2023-03-20 ENCOUNTER — Encounter (HOSPITAL_COMMUNITY)
Admission: RE | Disposition: A | Discharge status: Home / Self Care | Payer: Self-pay | Source: Home / Self Care | Attending: Orthopedic Surgery

## 2023-03-20 ENCOUNTER — Encounter (HOSPITAL_COMMUNITY): Payer: Self-pay | Admitting: Orthopedic Surgery

## 2023-03-20 DIAGNOSIS — Z6841 Body Mass Index (BMI) 40.0 and over, adult: Secondary | ICD-10-CM | POA: Diagnosis not present

## 2023-03-20 DIAGNOSIS — M1712 Unilateral primary osteoarthritis, left knee: Principal | ICD-10-CM | POA: Diagnosis present

## 2023-03-20 DIAGNOSIS — Z79899 Other long term (current) drug therapy: Secondary | ICD-10-CM | POA: Insufficient documentation

## 2023-03-20 DIAGNOSIS — M179 Osteoarthritis of knee, unspecified: Principal | ICD-10-CM | POA: Diagnosis present

## 2023-03-20 DIAGNOSIS — I10 Essential (primary) hypertension: Secondary | ICD-10-CM | POA: Diagnosis not present

## 2023-03-20 HISTORY — PX: TOTAL KNEE ARTHROPLASTY: SHX125

## 2023-03-20 SURGERY — ARTHROPLASTY, KNEE, TOTAL
Anesthesia: Regional | Site: Knee | Laterality: Left

## 2023-03-20 MED ORDER — OXYCODONE HCL 5 MG PO TABS
10.0000 mg | ORAL_TABLET | ORAL | Status: DC | PRN
  Administered 2023-03-20 – 2023-03-21 (×5): 15 mg via ORAL
  Filled 2023-03-20 (×5): qty 3

## 2023-03-20 MED ORDER — BUPIVACAINE LIPOSOME 1.3 % IJ SUSP
INTRAMUSCULAR | Status: DC | PRN
  Administered 2023-03-20: 20 mL

## 2023-03-20 MED ORDER — FLUOXETINE HCL 20 MG PO CAPS
40.0000 mg | ORAL_CAPSULE | Freq: Every day | ORAL | Status: DC
  Administered 2023-03-21: 40 mg via ORAL
  Filled 2023-03-20: qty 2

## 2023-03-20 MED ORDER — METHOCARBAMOL 500 MG IVPB - SIMPLE MED
INTRAVENOUS | Status: AC
  Filled 2023-03-20: qty 55

## 2023-03-20 MED ORDER — STERILE WATER FOR IRRIGATION IR SOLN
Status: DC | PRN
  Administered 2023-03-20: 2000 mL

## 2023-03-20 MED ORDER — CEFAZOLIN SODIUM-DEXTROSE 2-4 GM/100ML-% IV SOLN
2.0000 g | Freq: Four times a day (QID) | INTRAVENOUS | Status: AC
  Administered 2023-03-20 (×2): 2 g via INTRAVENOUS
  Filled 2023-03-20 (×2): qty 100

## 2023-03-20 MED ORDER — FLEET ENEMA 7-19 GM/118ML RE ENEM: 1.0000 | ENEMA | Freq: Once | RECTAL | Status: DC | PRN

## 2023-03-20 MED ORDER — 0.9 % SODIUM CHLORIDE (POUR BTL) OPTIME
TOPICAL | Status: DC | PRN
  Administered 2023-03-20: 1000 mL

## 2023-03-20 MED ORDER — HYDROMORPHONE HCL 1 MG/ML IJ SOLN
0.2500 mg | INTRAMUSCULAR | Status: DC | PRN
  Administered 2023-03-20 (×2): 0.5 mg via INTRAVENOUS

## 2023-03-20 MED ORDER — PHENYLEPHRINE 80 MCG/ML (10ML) SYRINGE FOR IV PUSH (FOR BLOOD PRESSURE SUPPORT)
PREFILLED_SYRINGE | INTRAVENOUS | Status: AC
  Filled 2023-03-20: qty 10

## 2023-03-20 MED ORDER — ACETAMINOPHEN 500 MG PO TABS
1000.0000 mg | ORAL_TABLET | Freq: Four times a day (QID) | ORAL | Status: AC
  Administered 2023-03-20 – 2023-03-21 (×3): 1000 mg via ORAL
  Filled 2023-03-20 (×4): qty 2

## 2023-03-20 MED ORDER — PROPOFOL 500 MG/50ML IV EMUL
INTRAVENOUS | Status: AC
  Filled 2023-03-20: qty 50

## 2023-03-20 MED ORDER — OXYCODONE HCL 5 MG PO TABS
5.0000 mg | ORAL_TABLET | ORAL | Status: DC | PRN
  Administered 2023-03-20: 10 mg via ORAL
  Filled 2023-03-20 (×2): qty 2

## 2023-03-20 MED ORDER — DEXAMETHASONE SODIUM PHOSPHATE 10 MG/ML IJ SOLN
10.0000 mg | Freq: Once | INTRAMUSCULAR | Status: AC
  Administered 2023-03-21: 10 mg via INTRAVENOUS
  Filled 2023-03-20: qty 1

## 2023-03-20 MED ORDER — PROPOFOL 500 MG/50ML IV EMUL
INTRAVENOUS | Status: DC | PRN
  Administered 2023-03-20: 75 ug/kg/min via INTRAVENOUS

## 2023-03-20 MED ORDER — PROPOFOL 10 MG/ML IV BOLUS
INTRAVENOUS | Status: DC | PRN
Start: 2023-03-20 — End: 2023-03-20
  Administered 2023-03-20: 20 mg via INTRAVENOUS
  Administered 2023-03-20: 10 mg via INTRAVENOUS
  Administered 2023-03-20 (×2): 20 mg via INTRAVENOUS

## 2023-03-20 MED ORDER — TRANEXAMIC ACID-NACL 1000-0.7 MG/100ML-% IV SOLN
1000.0000 mg | INTRAVENOUS | Status: AC
  Administered 2023-03-20: 1000 mg via INTRAVENOUS
  Filled 2023-03-20: qty 100

## 2023-03-20 MED ORDER — LACTATED RINGERS IV SOLN: INTRAVENOUS | Status: DC

## 2023-03-20 MED ORDER — ONDANSETRON HCL 4 MG/2ML IJ SOLN: 4.0000 mg | Freq: Once | INTRAMUSCULAR | Status: DC | PRN

## 2023-03-20 MED ORDER — MIDAZOLAM HCL 2 MG/2ML IJ SOLN
INTRAMUSCULAR | Status: AC
  Filled 2023-03-20: qty 2

## 2023-03-20 MED ORDER — POLYETHYLENE GLYCOL 3350 17 G PO PACK: 17.0000 g | PACK | Freq: Every day | ORAL | Status: DC | PRN

## 2023-03-20 MED ORDER — ONDANSETRON HCL 4 MG/2ML IJ SOLN: 4.0000 mg | Freq: Four times a day (QID) | INTRAMUSCULAR | Status: DC | PRN

## 2023-03-20 MED ORDER — METOCLOPRAMIDE HCL 5 MG/ML IJ SOLN: 5.0000 mg | Freq: Three times a day (TID) | INTRAMUSCULAR | Status: DC | PRN

## 2023-03-20 MED ORDER — METHOCARBAMOL 500 MG PO TABS
500.0000 mg | ORAL_TABLET | Freq: Four times a day (QID) | ORAL | Status: DC | PRN
  Administered 2023-03-20 – 2023-03-21 (×3): 500 mg via ORAL
  Filled 2023-03-20 (×3): qty 1

## 2023-03-20 MED ORDER — CHLORHEXIDINE GLUCONATE 0.12 % MT SOLN
15.0000 mL | Freq: Once | OROMUCOSAL | Status: AC
  Administered 2023-03-20: 15 mL via OROMUCOSAL

## 2023-03-20 MED ORDER — ONDANSETRON HCL 4 MG/2ML IJ SOLN
INTRAMUSCULAR | Status: DC | PRN
  Administered 2023-03-20: 4 mg via INTRAVENOUS

## 2023-03-20 MED ORDER — METHOCARBAMOL 500 MG IVPB - SIMPLE MED
500.0000 mg | Freq: Four times a day (QID) | INTRAVENOUS | Status: DC | PRN
  Administered 2023-03-20: 500 mg via INTRAVENOUS
  Filled 2023-03-20: qty 55

## 2023-03-20 MED ORDER — GABAPENTIN 300 MG PO CAPS
300.0000 mg | ORAL_CAPSULE | Freq: Three times a day (TID) | ORAL | Status: DC
  Administered 2023-03-20 – 2023-03-21 (×3): 300 mg via ORAL
  Filled 2023-03-20 (×3): qty 1

## 2023-03-20 MED ORDER — POVIDONE-IODINE 10 % EX SWAB: 2.0000 | Freq: Once | CUTANEOUS | Status: DC

## 2023-03-20 MED ORDER — PROPOFOL 1000 MG/100ML IV EMUL
INTRAVENOUS | Status: AC
  Filled 2023-03-20: qty 100

## 2023-03-20 MED ORDER — PANTOPRAZOLE SODIUM 40 MG PO TBEC
40.0000 mg | DELAYED_RELEASE_TABLET | Freq: Every day | ORAL | Status: DC
  Administered 2023-03-21: 40 mg via ORAL
  Filled 2023-03-20: qty 1

## 2023-03-20 MED ORDER — SODIUM CHLORIDE (PF) 0.9 % IJ SOLN
INTRAMUSCULAR | Status: AC
  Filled 2023-03-20: qty 50

## 2023-03-20 MED ORDER — ACETAMINOPHEN 10 MG/ML IV SOLN: 1000.0000 mg | Freq: Once | INTRAVENOUS | Status: DC | PRN

## 2023-03-20 MED ORDER — DOCUSATE SODIUM 100 MG PO CAPS
100.0000 mg | ORAL_CAPSULE | Freq: Two times a day (BID) | ORAL | Status: DC
  Administered 2023-03-20 – 2023-03-21 (×2): 100 mg via ORAL
  Filled 2023-03-20 (×2): qty 1

## 2023-03-20 MED ORDER — BISACODYL 10 MG RE SUPP: 10.0000 mg | Freq: Every day | RECTAL | Status: DC | PRN

## 2023-03-20 MED ORDER — SODIUM CHLORIDE (PF) 0.9 % IJ SOLN
INTRAMUSCULAR | Status: AC
  Filled 2023-03-20: qty 10

## 2023-03-20 MED ORDER — AMLODIPINE BESYLATE 10 MG PO TABS: 10.0000 mg | ORAL_TABLET | Freq: Every evening | ORAL | Status: DC

## 2023-03-20 MED ORDER — DIPHENHYDRAMINE HCL 12.5 MG/5ML PO ELIX: 12.5000 mg | ORAL_SOLUTION | ORAL | Status: DC | PRN

## 2023-03-20 MED ORDER — BUDESONIDE 0.25 MG/2ML IN SUSP
0.2500 mg | Freq: Two times a day (BID) | RESPIRATORY_TRACT | Status: DC
  Administered 2023-03-20 – 2023-03-21 (×3): 0.25 mg via RESPIRATORY_TRACT
  Filled 2023-03-20 (×3): qty 2

## 2023-03-20 MED ORDER — MONTELUKAST SODIUM 10 MG PO TABS
10.0000 mg | ORAL_TABLET | Freq: Every day | ORAL | Status: DC
  Administered 2023-03-20: 10 mg via ORAL
  Filled 2023-03-20: qty 1

## 2023-03-20 MED ORDER — CLONIDINE HCL (ANALGESIA) 100 MCG/ML EP SOLN
EPIDURAL | Status: DC | PRN
  Administered 2023-03-20: 100 ug

## 2023-03-20 MED ORDER — BUPIVACAINE LIPOSOME 1.3 % IJ SUSP
INTRAMUSCULAR | Status: AC
  Filled 2023-03-20: qty 20

## 2023-03-20 MED ORDER — SODIUM CHLORIDE (PF) 0.9 % IJ SOLN
INTRAMUSCULAR | Status: DC | PRN
  Administered 2023-03-20: 60 mL

## 2023-03-20 MED ORDER — DEXAMETHASONE SODIUM PHOSPHATE 10 MG/ML IJ SOLN: 8.0000 mg | Freq: Once | INTRAMUSCULAR | Status: DC

## 2023-03-20 MED ORDER — HYDROMORPHONE HCL 1 MG/ML IJ SOLN
0.5000 mg | INTRAMUSCULAR | Status: DC | PRN
  Administered 2023-03-20 (×2): 1 mg via INTRAVENOUS
  Filled 2023-03-20 (×3): qty 1

## 2023-03-20 MED ORDER — BUPIVACAINE LIPOSOME 1.3 % IJ SUSP: 20.0000 mL | Freq: Once | INTRAMUSCULAR | Status: DC

## 2023-03-20 MED ORDER — HYDROMORPHONE HCL 1 MG/ML IJ SOLN
INTRAMUSCULAR | Status: AC
  Filled 2023-03-20: qty 1

## 2023-03-20 MED ORDER — ORAL CARE MOUTH RINSE: 15.0000 mL | Freq: Once | OROMUCOSAL | Status: AC

## 2023-03-20 MED ORDER — ONDANSETRON HCL 4 MG/2ML IJ SOLN
INTRAMUSCULAR | Status: AC
  Filled 2023-03-20: qty 2

## 2023-03-20 MED ORDER — ONDANSETRON HCL 4 MG PO TABS: 4.0000 mg | ORAL_TABLET | Freq: Four times a day (QID) | ORAL | Status: DC | PRN

## 2023-03-20 MED ORDER — PHENOL 1.4 % MT LIQD: 1.0000 | OROMUCOSAL | Status: DC | PRN

## 2023-03-20 MED ORDER — OXYCODONE HCL 5 MG/5ML PO SOLN: 5.0000 mg | Freq: Once | ORAL | Status: DC | PRN

## 2023-03-20 MED ORDER — METOCLOPRAMIDE HCL 5 MG PO TABS: 5.0000 mg | ORAL_TABLET | Freq: Three times a day (TID) | ORAL | Status: DC | PRN

## 2023-03-20 MED ORDER — HYDROCHLOROTHIAZIDE 25 MG PO TABS
25.0000 mg | ORAL_TABLET | Freq: Every day | ORAL | Status: DC
  Administered 2023-03-21: 25 mg via ORAL
  Filled 2023-03-20: qty 1

## 2023-03-20 MED ORDER — SODIUM CHLORIDE 0.9 % IR SOLN
Status: DC | PRN
  Administered 2023-03-20: 1000 mL

## 2023-03-20 MED ORDER — MENTHOL 3 MG MT LOZG: 1.0000 | LOZENGE | OROMUCOSAL | Status: DC | PRN

## 2023-03-20 MED ORDER — OXYCODONE HCL 5 MG PO TABS: 5.0000 mg | ORAL_TABLET | Freq: Once | ORAL | Status: DC | PRN

## 2023-03-20 MED ORDER — ACETAMINOPHEN 10 MG/ML IV SOLN
1000.0000 mg | Freq: Four times a day (QID) | INTRAVENOUS | Status: DC
  Administered 2023-03-20: 1000 mg via INTRAVENOUS
  Filled 2023-03-20: qty 100

## 2023-03-20 MED ORDER — ALPRAZOLAM 0.5 MG PO TABS
0.5000 mg | ORAL_TABLET | Freq: Four times a day (QID) | ORAL | Status: DC | PRN
  Administered 2023-03-20: 0.5 mg via ORAL
  Filled 2023-03-20: qty 1

## 2023-03-20 MED ORDER — ASPIRIN 81 MG PO CHEW
81.0000 mg | CHEWABLE_TABLET | Freq: Two times a day (BID) | ORAL | Status: DC
  Administered 2023-03-21: 81 mg via ORAL
  Filled 2023-03-20: qty 1

## 2023-03-20 MED ORDER — PHENYLEPHRINE 80 MCG/ML (10ML) SYRINGE FOR IV PUSH (FOR BLOOD PRESSURE SUPPORT)
PREFILLED_SYRINGE | INTRAVENOUS | Status: DC | PRN
  Administered 2023-03-20 (×5): 160 ug via INTRAVENOUS
  Administered 2023-03-20: 80 ug via INTRAVENOUS
  Administered 2023-03-20 (×3): 160 ug via INTRAVENOUS

## 2023-03-20 MED ORDER — FENTANYL CITRATE (PF) 100 MCG/2ML IJ SOLN
INTRAMUSCULAR | Status: AC
  Filled 2023-03-20: qty 2

## 2023-03-20 MED ORDER — FENTANYL CITRATE (PF) 100 MCG/2ML IJ SOLN
INTRAMUSCULAR | Status: DC | PRN
  Administered 2023-03-20 (×2): 50 ug via INTRAVENOUS

## 2023-03-20 MED ORDER — FERROUS SULFATE 325 (65 FE) MG PO TABS
325.0000 mg | ORAL_TABLET | Freq: Every day | ORAL | Status: DC
  Administered 2023-03-21: 325 mg via ORAL
  Filled 2023-03-20: qty 1

## 2023-03-20 MED ORDER — AMISULPRIDE (ANTIEMETIC) 5 MG/2ML IV SOLN: 10.0000 mg | Freq: Once | INTRAVENOUS | Status: DC | PRN

## 2023-03-20 MED ORDER — SODIUM CHLORIDE 0.9 % IV SOLN: INTRAVENOUS | Status: DC

## 2023-03-20 MED ORDER — ALBUTEROL SULFATE (2.5 MG/3ML) 0.083% IN NEBU: 3.0000 mL | INHALATION_SOLUTION | Freq: Four times a day (QID) | RESPIRATORY_TRACT | Status: DC | PRN

## 2023-03-20 MED ORDER — ROPIVACAINE HCL 5 MG/ML IJ SOLN
INTRAMUSCULAR | Status: DC | PRN
  Administered 2023-03-20: 30 mL via PERINEURAL

## 2023-03-20 MED ORDER — MIDAZOLAM HCL 2 MG/2ML IJ SOLN
INTRAMUSCULAR | Status: DC | PRN
  Administered 2023-03-20: 2 mg via INTRAVENOUS

## 2023-03-20 MED ORDER — BUPIVACAINE IN DEXTROSE 0.75-8.25 % IT SOLN
INTRATHECAL | Status: DC | PRN
  Administered 2023-03-20: 1.6 mL via INTRATHECAL

## 2023-03-20 MED ORDER — CEFAZOLIN SODIUM-DEXTROSE 2-4 GM/100ML-% IV SOLN
2.0000 g | INTRAVENOUS | Status: AC
  Administered 2023-03-20: 2 g via INTRAVENOUS
  Filled 2023-03-20: qty 100

## 2023-03-20 SURGICAL SUPPLY — 58 items
ADH SKN CLS APL DERMABOND .7 (GAUZE/BANDAGES/DRESSINGS) ×1
ATTUNE PS FEM LT SZ 5 CEM KNEE (Femur) IMPLANT
ATTUNE PSRP INSR SZ5 8 KNEE (Insert) IMPLANT
BAG COUNTER SPONGE SURGICOUNT (BAG) IMPLANT
BAG SPEC THK2 15X12 ZIP CLS (MISCELLANEOUS) ×1
BAG SPNG CNTER NS LX DISP (BAG) ×1
BAG ZIPLOCK 12X15 (MISCELLANEOUS) ×1 IMPLANT
BASEPLATE TIBIAL ROTATING SZ 4 (Knees) IMPLANT
BLADE SAG 18X100X1.27 (BLADE) ×1 IMPLANT
BLADE SAW SGTL 11.0X1.19X90.0M (BLADE) ×1 IMPLANT
BNDG CMPR 6 X 5 YARDS HK CLSR (GAUZE/BANDAGES/DRESSINGS) ×1
BNDG CMPR MED 10X6 ELC LF (GAUZE/BANDAGES/DRESSINGS)
BNDG ELASTIC 6INX 5YD STR LF (GAUZE/BANDAGES/DRESSINGS) ×1 IMPLANT
BNDG ELASTIC 6X10 VLCR STRL LF (GAUZE/BANDAGES/DRESSINGS) IMPLANT
BOWL SMART MIX CTS (DISPOSABLE) ×1 IMPLANT
BSPLAT TIB 4 CMNT ROT PLAT STR (Knees) ×1 IMPLANT
CEMENT HV SMART SET (Cement) ×2 IMPLANT
COVER SURGICAL LIGHT HANDLE (MISCELLANEOUS) ×1 IMPLANT
CUFF TOURN SGL QUICK 34 (TOURNIQUET CUFF)
CUFF TRNQT CYL 34X4.125X (TOURNIQUET CUFF) ×1 IMPLANT
DERMABOND ADVANCED .7 DNX12 (GAUZE/BANDAGES/DRESSINGS) ×1 IMPLANT
DRAPE INCISE IOBAN 66X45 STRL (DRAPES) ×1 IMPLANT
DRAPE U-SHAPE 47X51 STRL (DRAPES) ×1 IMPLANT
DRSG AQUACEL AG ADV 3.5X10 (GAUZE/BANDAGES/DRESSINGS) ×1 IMPLANT
DURAPREP 26ML APPLICATOR (WOUND CARE) ×1 IMPLANT
ELECT REM PT RETURN 15FT ADLT (MISCELLANEOUS) ×1 IMPLANT
GLOVE BIO SURGEON STRL SZ 6.5 (GLOVE) IMPLANT
GLOVE BIO SURGEON STRL SZ8 (GLOVE) ×1 IMPLANT
GLOVE BIOGEL PI IND STRL 6.5 (GLOVE) IMPLANT
GLOVE BIOGEL PI IND STRL 7.0 (GLOVE) IMPLANT
GLOVE BIOGEL PI IND STRL 8 (GLOVE) ×1 IMPLANT
GOWN STRL REUS W/ TWL LRG LVL3 (GOWN DISPOSABLE) ×1 IMPLANT
GOWN STRL REUS W/TWL LRG LVL3 (GOWN DISPOSABLE) ×1
HANDPIECE INTERPULSE COAX TIP (DISPOSABLE) ×1
HOLDER FOLEY CATH W/STRAP (MISCELLANEOUS) IMPLANT
IMMOBILIZER KNEE 20 (SOFTGOODS) ×1
IMMOBILIZER KNEE 20 THIGH 36 (SOFTGOODS) ×1 IMPLANT
KIT TURNOVER KIT A (KITS) IMPLANT
MANIFOLD NEPTUNE II (INSTRUMENTS) ×1 IMPLANT
NS IRRIG 1000ML POUR BTL (IV SOLUTION) ×1 IMPLANT
PACK TOTAL KNEE CUSTOM (KITS) ×1 IMPLANT
PADDING CAST COTTON 6X4 STRL (CAST SUPPLIES) ×2 IMPLANT
PATELLA MEDIAL ATTUN 35MM KNEE (Knees) IMPLANT
PIN STEINMAN FIXATION KNEE (PIN) IMPLANT
PROTECTOR NERVE ULNAR (MISCELLANEOUS) ×1 IMPLANT
SET HNDPC FAN SPRY TIP SCT (DISPOSABLE) ×1 IMPLANT
SPIKE FLUID TRANSFER (MISCELLANEOUS) ×1 IMPLANT
SUT MNCRL AB 4-0 PS2 18 (SUTURE) ×1 IMPLANT
SUT STRATAFIX 0 PDS 27 VIOLET (SUTURE)
SUT STRATAFIX 1PDS 45CM VIOLET (SUTURE) IMPLANT
SUT VIC AB 2-0 CT1 27 (SUTURE) ×3
SUT VIC AB 2-0 CT1 TAPERPNT 27 (SUTURE) ×3 IMPLANT
SUTURE STRATFX 0 PDS 27 VIOLET (SUTURE) ×1 IMPLANT
TRAY FOLEY MTR SLVR 14FR STAT (SET/KITS/TRAYS/PACK) IMPLANT
TRAY FOLEY MTR SLVR 16FR STAT (SET/KITS/TRAYS/PACK) ×1 IMPLANT
TUBE SUCTION HIGH CAP CLEAR NV (SUCTIONS) ×1 IMPLANT
WATER STERILE IRR 1000ML POUR (IV SOLUTION) ×2 IMPLANT
WRAP KNEE MAXI GEL POST OP (GAUZE/BANDAGES/DRESSINGS) ×1 IMPLANT

## 2023-03-20 NOTE — Anesthesia Procedure Notes (Addendum)
Anesthesia Regional Block: Adductor canal block   Pre-Anesthetic Checklist: , timeout performed,  Correct Patient, Correct Site, Correct Laterality,  Correct Procedure, Correct Position, site marked,  Risks and benefits discussed,  Surgical consent,  Pre-op evaluation,  At surgeon's request and post-op pain management  Laterality: Lower and Left  Prep: chloraprep       Needles:  Injection technique: Single-shot  Needle Type: Echogenic Needle     Needle Length: 9cm  Needle Gauge: 22     Additional Needles:   Procedures:,,,, ultrasound used (permanent image in chart),,    Narrative:  Start time: 03/20/2023 6:45 AM End time: 03/20/2023 6:54 AM Injection made incrementally with aspirations every 5 mL.  Performed by: Personally  Anesthesiologist: Trevor Iha, MD  Additional Notes: Block assessed prior to surgery. Pt tolerated procedure well.

## 2023-03-20 NOTE — Progress Notes (Signed)
Orthopedic Tech Progress Note Patient Details:  Aimee [name] 02-19-1961 [id number] Applied CPM per order. Will remove CPM at 1:30pm.  CPM Left Knee CPM Left Knee: On Left Knee Flexion (Degrees): 40 Left Knee Extension (Degrees): 10  Post Interventions Patient Tolerated: Well Instructions Provided: Adjustment of device, Care of device Ortho Devices Type of Ortho Device: CPM padding Ortho Device/Splint Location: LLE Ortho Device/Splint Interventions: Ordered, Application, Adjustment   Post Interventions Patient Tolerated: Well Instructions Provided: Adjustment of device, Care of device  Blase Mess 03/20/2023, 9:38 AM

## 2023-03-20 NOTE — Evaluation (Signed)
Physical Therapy Evaluation Patient Details Name: Aimee [name] MRN: [id number] DOB: [birth year] Today's Date: 03/20/2023  History of Present Illness  62 y.o. female admitted 03/20/23 for L TKA. PMH: OSO on CPAP, morbid obesity, HTN, anxiety, RA.  Clinical Impression  Pt is s/p TKA resulting in the deficits listed below (see PT Problem List). Pt ambulated 12' with RW, no loss of balance, distance limited by lightheadedness. Initiated TKA HEP. Good progress expected.  Pt will benefit from acute skilled PT to increase their independence and safety with mobility to allow discharge.          If plan is discharge home, recommend the following: A little help with walking and/or transfers;A little help with bathing/dressing/bathroom;Assistance with cooking/housework;Help with stairs or ramp for entrance;Assist for transportation   Can travel by private vehicle        Equipment Recommendations Rolling walker (2 wheels)  Recommendations for Other Services       Functional Status Assessment Patient has had a recent decline in their functional status and demonstrates the ability to make significant improvements in function in a reasonable and predictable amount of time.     Precautions / Restrictions Precautions Precautions: Knee Restrictions Weight Bearing Restrictions: No Other Position/Activity Restrictions: WBAT      Mobility  Bed Mobility Overal bed mobility: Needs Assistance Bed Mobility: Supine to Sit     Supine to sit: Min assist, HOB elevated     General bed mobility comments: HOB up, assist to support LLE    Transfers Overall transfer level: Needs assistance Equipment used: Rolling walker (2 wheels) Transfers: Sit to/from Stand Sit to Stand: Min guard, From elevated surface           General transfer comment: VCs hand placement    Ambulation/Gait Ambulation/Gait assistance: Min guard Gait Distance (Feet): 12 Feet Assistive device: Rolling walker (2  wheels) Gait Pattern/deviations: Decreased step length - right, Decreased step length - left, Step-through pattern Gait velocity: decr     General Gait Details: VCs sequencing, no buckling nor loss of balance, distance limited by fatigue and lightheadedness  Stairs            Wheelchair Mobility     Tilt Bed    Modified Rankin (Stroke Patients Only)       Balance Overall balance assessment: Modified Independent                                           Pertinent Vitals/Pain Pain Assessment Pain Assessment: 0-10 Pain Score: 4  Pain Location: L knee Pain Descriptors / Indicators: Sore Pain Intervention(s): Limited activity within patient's tolerance, Monitored during session, Premedicated before session, Ice applied    Home Living Family/patient expects to be discharged to:: Private residence Living Arrangements: Spouse/significant other Available Help at Discharge: Family;Available 24 hours/day   Home Access: Stairs to enter Entrance Stairs-Rails: Right;Left;Can reach both Entrance Stairs-Number of Steps: 4   Home Layout: One level Home Equipment: Cane - single point;Toilet riser      Prior Function Prior Level of Function : Independent/Modified Independent;Driving             Mobility Comments: walked without AD, no falls in past 6 months ADLs Comments: independent     Hand Dominance        Extremity/Trunk Assessment   Upper Extremity Assessment Upper Extremity Assessment: Overall WFL for tasks assessed  Lower Extremity Assessment Lower Extremity Assessment: LLE deficits/detail LLE Deficits / Details: knee ext -3/5, SLR 2/5 LLE Sensation: WNL LLE Coordination: WNL    Cervical / Trunk Assessment Cervical / Trunk Assessment: Normal  Communication   Communication: No difficulties  Cognition Arousal/Alertness: Awake/alert Behavior During Therapy: WFL for tasks assessed/performed Overall Cognitive Status: Within  Functional Limits for tasks assessed                                          General Comments      Exercises Total Joint Exercises Ankle Circles/Pumps: AROM, Both, 10 reps, Supine Heel Slides: AAROM, Left, 10 reps, Supine   Assessment/Plan    PT Assessment Patient needs continued PT services  PT Problem List Decreased range of motion;Decreased activity tolerance;Decreased strength;Decreased balance;Decreased mobility;Pain       PT Treatment Interventions Gait training;DME instruction;Therapeutic exercise;Functional mobility training;Therapeutic activities;Patient/family education    PT Goals (Current goals can be found in the Care Plan section)  Acute Rehab PT Goals Patient Stated Goal: go for long walks PT Goal Formulation: With patient/family Time For Goal Achievement: 03/27/23 Potential to Achieve Goals: Good    Frequency 7X/week     Co-evaluation               AM-PAC PT "6 Clicks" Mobility  Outcome Measure Help needed turning from your back to your side while in a flat bed without using bedrails?: A Little Help needed moving from lying on your back to sitting on the side of a flat bed without using bedrails?: A Little Help needed moving to and from a bed to a chair (including a wheelchair)?: A Little Help needed standing up from a chair using your arms (e.g., wheelchair or bedside chair)?: A Little Help needed to walk in hospital room?: A Little Help needed climbing 3-5 steps with a railing? : A Lot 6 Click Score: 17    End of Session Equipment Utilized During Treatment: Gait belt Activity Tolerance: Patient tolerated treatment well Patient left: in chair;with chair alarm set;with call bell/phone within reach   PT Visit Diagnosis: Pain;Difficulty in walking, not elsewhere classified (R26.2) Pain - Right/Left: Left Pain - part of body: Knee    Time: 1211-1234 PT Time Calculation (min) (ACUTE ONLY): 23 min   Charges:   PT  Evaluation $PT Eval Moderate Complexity: 1 Mod PT Treatments $Gait Training: 8-22 mins PT General Charges $$ ACUTE PT VISIT: 1 Visit         Tamala Ser PT 03/20/2023  Acute Rehabilitation Services  Office [phone]

## 2023-03-20 NOTE — Op Note (Signed)
OPERATIVE REPORT-TOTAL KNEE ARTHROPLASTY   Pre-operative diagnosis- Osteoarthritis  Left knee(s)  Post-operative diagnosis- Osteoarthritis Left knee(s)  Procedure-  Left  Total Knee Arthroplasty  Surgeon- Gus Rankin. , MD  Assistant- Arther Abbott, PA-C   Anesthesia-   Adductor canal block and spinal  EBL-100 mL   Drains None  Tourniquet time-  Total Tourniquet Time Documented: Thigh (Left) - 40 minutes Total: Thigh (Left) - 40 minutes     Complications- None  Condition-PACU - hemodynamically stable.   Brief Clinical Note  Aimee [name] is a 62 y.o. year old female with end stage OA of her left knee with progressively worsening pain and dysfunction. She has constant pain, with activity and at rest and significant functional deficits with difficulties even with ADLs. She has had extensive non-op management including analgesics, injections of cortisone and viscosupplements, and home exercise program, but remains in significant pain with significant dysfunction. Radiographs show bone on bone arthritis medial and patellofemoral. She presents now for left Total Knee Arthroplasty.     Procedure in detail---   The patient is brought into the operating room and positioned supine on the operating table. After successful administration of  Adductor canal block and spinal,   a tourniquet is placed high on the  Left thigh(s) and the lower extremity is prepped and draped in the usual sterile fashion. Time out is performed by the operating team and then the  Left lower extremity is wrapped in Esmarch, knee flexed and the tourniquet inflated to 300 mmHg.       A midline incision is made with a ten blade through the subcutaneous tissue to the level of the extensor mechanism. A fresh blade is used to make a medial parapatellar arthrotomy. Soft tissue over the proximal medial tibia is subperiosteally elevated to the joint line with a knife and into the semimembranosus bursa with a Cobb  elevator. Soft tissue over the proximal lateral tibia is elevated with attention being paid to avoiding the patellar tendon on the tibial tubercle. The patella is everted, knee flexed 90 degrees and the ACL and PCL are removed. Findings are bone on bone medial and patellofemoral with massive global osteophytes        The drill is used to create a starting hole in the distal femur and the canal is thoroughly irrigated with sterile saline to remove the fatty contents. The 5 degree Left  valgus alignment guide is placed into the femoral canal and the distal femoral cutting block is pinned to remove 9 mm off the distal femur. Resection is made with an oscillating saw.      The tibia is subluxed forward and the menisci are removed. The extramedullary alignment guide is placed referencing proximally at the medial aspect of the tibial tubercle and distally along the second metatarsal axis and tibial crest. The block is pinned to remove 2mm off the more deficient medial  side. Resection is made with an oscillating saw. Size 4is the most appropriate size for the tibia and the proximal tibia is prepared with the modular drill and keel punch for that size.      The femoral sizing guide is placed and size 5 is most appropriate. Rotation is marked off the epicondylar axis and confirmed by creating a rectangular flexion gap at 90 degrees. The size 5 cutting block is pinned in this rotation and the anterior, posterior and chamfer cuts are made with the oscillating saw. The intercondylar block is then placed and that cut is  made.      Trial size 4 tibial component, trial size 5 posterior stabilized femur and a 8  mm posterior stabilized rotating platform insert trial is placed. Full extension is achieved with excellent varus/valgus and anterior/posterior balance throughout full range of motion. The patella is everted and thickness measured to be 22  mm. Free hand resection is taken to 12 mm, a 35 template is placed, lug holes  are drilled, trial patella is placed, and it tracks normally. Osteophytes are removed off the posterior femur with the trial in place. All trials are removed and the cut bone surfaces prepared with pulsatile lavage. Cement is mixed and once ready for implantation, the size 4 tibial implant, size  5 posterior stabilized femoral component, and the size 35 patella are cemented in place and the patella is held with the clamp. The trial insert is placed and the knee held in full extension. The Exparel (20 ml mixed with 60 ml saline) is injected into the extensor mechanism, posterior capsule, medial and lateral gutters and subcutaneous tissues.  All extruded cement is removed and once the cement is hard the permanent 8 mm posterior stabilized rotating platform insert is placed into the tibial tray.      The wound is copiously irrigated with saline solution and the extensor mechanism closed with # 0 Stratofix suture. The tourniquet is released for a total tourniquet time of 40  minutes. Flexion against gravity is 130 degrees and the patella tracks normally. Subcutaneous tissue is closed with 2.0 vicryl and subcuticular with running 4.0 Monocryl. The incision is cleaned and dried and steri-strips and a bulky sterile dressing are applied. The limb is placed into a knee immobilizer and the patient is awakened and transported to recovery in stable condition.      Please note that a surgical assistant was a medical necessity for this procedure in order to perform it in a safe and expeditious manner. Surgical assistant was necessary to retract the ligaments and vital neurovascular structures to prevent injury to them and also necessary for proper positioning of the limb to allow for anatomic placement of the prosthesis.   Gus Rankin , MD    03/20/2023, 8:32 AM

## 2023-03-20 NOTE — Anesthesia Procedure Notes (Signed)
Spinal  Patient location during procedure: OR Start time: 03/20/2023 7:16 AM End time: 03/20/2023 7:21 AM Reason for block: surgical anesthesia Staffing Performed: anesthesiologist  Anesthesiologist: Trevor Iha, MD Performed by: Trevor Iha, MD Authorized by: Trevor Iha, MD   Preanesthetic Checklist Completed: patient identified, IV checked, risks and benefits discussed, surgical consent, monitors and equipment checked, pre-op evaluation and timeout performed Spinal Block Patient position: sitting Prep: DuraPrep and site prepped and draped Patient monitoring: heart rate, cardiac monitor, continuous pulse ox and blood pressure Approach: midline Location: L3-4 Injection technique: single-shot Needle Needle type: Pencan  Needle gauge: 24 G Needle length: 10 cm Needle insertion depth: 8 cm Assessment Sensory level: T4 Events: CSF return Additional Notes  1Attempt (s). Pt tolerated procedure well.

## 2023-03-20 NOTE — Transfer of Care (Signed)
Immediate Anesthesia Transfer of Care Note  Patient: Aimee [name]  Procedure(s) Performed: TOTAL KNEE ARTHROPLASTY (Left: Knee)  Patient Location: PACU  Anesthesia Type:Spinal and MAC combined with regional for post-op pain  Level of Consciousness: awake, alert , and oriented  Airway & Oxygen Therapy: Patient Spontanous Breathing and Patient connected to face mask oxygen  Post-op Assessment: Report given to RN and Post -op Vital signs reviewed and stable  Post vital signs: Reviewed and stable  Last Vitals:  Vitals Value Taken Time  BP 102/56   Temp    Pulse 69 03/20/23 0856  Resp 15 03/20/23 0856  SpO2 94 % 03/20/23 0856  Vitals shown include unfiled device data.  Last Pain:  Vitals:   03/20/23 0556  TempSrc: Oral  PainSc:          Complications: No notable events documented.

## 2023-03-20 NOTE — Anesthesia Postprocedure Evaluation (Signed)
Anesthesia Post Note  Patient: Aimee [name]  Procedure(s) Performed: TOTAL KNEE ARTHROPLASTY (Left: Knee)     Patient location during evaluation: Nursing Unit Anesthesia Type: Regional and Spinal Level of consciousness: oriented and awake and alert Pain management: pain level controlled Vital Signs Assessment: post-procedure vital signs reviewed and stable Respiratory status: spontaneous breathing and respiratory function stable Cardiovascular status: blood pressure returned to baseline and stable Postop Assessment: no headache, no backache, no apparent nausea or vomiting and patient able to bend at knees Anesthetic complications: no   No notable events documented.  Last Vitals:  Vitals:   03/20/23 1030 03/20/23 1045  BP: 134/87 133/85  Pulse: (!) 59 68  Resp: 11 11  Temp:    SpO2: 99% 99%    Last Pain:  Vitals:   03/20/23 1110  TempSrc:   PainSc: 7                  Trevor Iha

## 2023-03-20 NOTE — Interval H&P Note (Signed)
History and Physical Interval Note:  03/20/2023 6:29 AM  Aimee [name]  has presented today for surgery, with the diagnosis of Left knee osteoarthritis.  The various methods of treatment have been discussed with the patient and family. After consideration of risks, benefits and other options for treatment, the patient has consented to  Procedure(s): TOTAL KNEE ARTHROPLASTY (Left) as a surgical intervention.  The patient's history has been reviewed, patient examined, no change in status, stable for surgery.  I have reviewed the patient's chart and labs.  Questions were answered to the patient's satisfaction.     [name] 

## 2023-03-20 NOTE — Discharge Instructions (Signed)
Gaynelle Arabian, MD Total Joint Specialist [facility] [place] [address], [address] [place], [place] [address] [phone]  TOTAL KNEE REPLACEMENT POSTOPERATIVE DIRECTIONS    Knee Rehabilitation, Guidelines Following Surgery  Results after knee surgery are often greatly improved when you follow the exercise, range of motion and muscle strengthening exercises prescribed by your doctor. Safety measures are also important to protect the knee from further injury. If any of these exercises cause you to have increased pain or swelling in your knee joint, decrease the amount until you are comfortable again and slowly increase them. If you have problems or questions, call your caregiver or physical therapist for advice.   BLOOD CLOT PREVENTION Take an 81 mg Aspirin two times a day for three weeks following surgery. Then take an 81 mg Aspirin once a day for three weeks. Then discontinue Aspirin. You may resume your vitamins/supplements upon discharge from the hospital. Do not take any NSAIDs (Advil, Aleve, Ibuprofen, Meloxicam, etc.) until you are 3 weeks out from surgery.  HOME CARE INSTRUCTIONS  Remove items at home which could result in a fall. This includes throw rugs or furniture in walking pathways.  ICE to the affected knee as much as tolerated. Icing helps control swelling. If the swelling is well controlled you will be more comfortable and rehab easier. Continue to use ice on the knee for pain and swelling from surgery. You may notice swelling that will progress down to the foot and ankle. This is normal after surgery. Elevate the leg when you are not up walking on it.    Continue to use the breathing machine which will help keep your temperature down. It is common for your temperature to cycle up and down following surgery, especially at night when you are not up moving around and exerting yourself. The breathing machine keeps your lungs expanded and your temperature down. Do  not place pillow under the operative knee, focus on keeping the knee straight while resting  DIET You may resume your previous home diet once you are discharged from the hospital.  DRESSING / WOUND CARE / SHOWERING Keep your bulky bandage on for 2 days. On the third post-operative day you may remove the Ace bandage and gauze. There is a waterproof adhesive bandage on your skin which will stay in place until your first follow-up appointment. Once you remove this you will not need to place another bandage You may begin showering 3 days following surgery, but do not submerge the incision under water.  ACTIVITY For the first 5 days, the key is rest and control of pain and swelling Do your home exercises twice a day starting on post-operative day 3. On the days you go to physical therapy, just do the home exercises once that day. You should rest, ice and elevate the leg for 50 minutes out of every hour. Get up and walk/stretch for 10 minutes per hour. After 5 days you can increase your activity slowly as tolerated. Walk with your walker as instructed. Use the walker until you are comfortable transitioning to a cane. Walk with the cane in the opposite hand of the operative leg. You may discontinue the cane once you are comfortable and walking steadily. Avoid periods of inactivity such as sitting longer than an hour when not asleep. This helps prevent blood clots.  You may discontinue the knee immobilizer once you are able to perform a straight leg raise while lying down. You may resume a sexual relationship in one month  or when given the OK by your doctor.  You may return to work once you are cleared by your doctor.  Do not drive a car for 6 weeks or until released by your surgeon.  Do not drive while taking narcotics.  TED HOSE STOCKINGS Wear the elastic stockings on both legs for three weeks following surgery during the day. You may remove them at night for sleeping.  WEIGHT BEARING Weight  bearing as tolerated with assist device (walker, cane, etc) as directed, use it as long as suggested by your surgeon or therapist, typically at least 4-6 weeks.  POSTOPERATIVE CONSTIPATION PROTOCOL Constipation - defined medically as fewer than three stools per week and severe constipation as less than one stool per week.  One of the most common issues patients have following surgery is constipation.  Even if you have a regular bowel pattern at home, your normal regimen is likely to be disrupted due to multiple reasons following surgery.  Combination of anesthesia, postoperative narcotics, change in appetite and fluid intake all can affect your bowels.  In order to avoid complications following surgery, here are some recommendations in order to help you during your recovery period.  Colace (docusate) - Pick up an over-the-counter form of Colace or another stool softener and take twice a day as long as you are requiring postoperative pain medications.  Take with a full glass of water daily.  If you experience loose stools or diarrhea, hold the colace until you stool forms back up. If your symptoms do not get better within 1 week or if they get worse, check with your doctor. Dulcolax (bisacodyl) - Pick up over-the-counter and take as directed by the product packaging as needed to assist with the movement of your bowels.  Take with a full glass of water.  Use this product as needed if not relieved by Colace only.  MiraLax (polyethylene glycol) - Pick up over-the-counter to have on hand. MiraLax is a solution that will increase the amount of water in your bowels to assist with bowel movements.  Take as directed and can mix with a glass of water, juice, soda, coffee, or tea. Take if you go more than two days without a movement. Do not use MiraLax more than once per day. Call your doctor if you are still constipated or irregular after using this medication for 7 days in a row.  If you continue to have problems  with postoperative constipation, please contact the office for further assistance and recommendations.  If you experience "the worst abdominal pain ever" or develop nausea or vomiting, please contact the office immediatly for further recommendations for treatment.  ITCHING If you experience itching with your medications, try taking only a single pain pill, or even half a pain pill at a time.  You can also use Benadryl over the counter for itching or also to help with sleep.   MEDICATIONS See your medication summary on the "After Visit Summary" that the nursing staff will review with you prior to discharge.  You may have some home medications which will be placed on hold until you complete the course of blood thinner medication.  It is important for you to complete the blood thinner medication as prescribed by your surgeon.  Continue your approved medications as instructed at time of discharge.  PRECAUTIONS If you experience chest pain or shortness of breath - call 911 immediately for transfer to the hospital emergency department.  If you develop a fever greater that 101 F,  purulent drainage from wound, increased redness or drainage from wound, foul odor from the wound/dressing, or calf pain - CONTACT YOUR SURGEON.                                                   FOLLOW-UP APPOINTMENTS Make sure you keep all of your appointments after your operation with your surgeon and caregivers. You should call the office at the above phone number and make an appointment for approximately two weeks after the date of your surgery or on the date instructed by your surgeon outlined in the "After Visit Summary".  RANGE OF MOTION AND STRENGTHENING EXERCISES  Rehabilitation of the knee is important following a knee injury or an operation. After just a few days of immobilization, the muscles of the thigh which control the knee become weakened and shrink (atrophy). Knee exercises are designed to build up the tone and  strength of the thigh muscles and to improve knee motion. Often times heat used for twenty to thirty minutes before working out will loosen up your tissues and help with improving the range of motion but do not use heat for the first two weeks following surgery. These exercises can be done on a training (exercise) mat, on the floor, on a table or on a bed. Use what ever works the best and is most comfortable for you Knee exercises include:  Leg Lifts - While your knee is still immobilized in a splint or cast, you can do straight leg raises. Lift the leg to 60 degrees, hold for 3 sec, and slowly lower the leg. Repeat 10-20 times 2-3 times daily. Perform this exercise against resistance later as your knee gets better.  Quad and Hamstring Sets - Tighten up the muscle on the front of the thigh (Quad) and hold for 5-10 sec. Repeat this 10-20 times hourly. Hamstring sets are done by pushing the foot backward against an object and holding for 5-10 sec. Repeat as with quad sets.  Leg Slides: Lying on your back, slowly slide your foot toward your buttocks, bending your knee up off the floor (only go as far as is comfortable). Then slowly slide your foot back down until your leg is flat on the floor again. Angel Wings: Lying on your back spread your legs to the side as far apart as you can without causing discomfort.  A rehabilitation program following serious knee injuries can speed recovery and prevent re-injury in the future due to weakened muscles. Contact your doctor or a physical therapist for more information on knee rehabilitation.   POST-OPERATIVE OPIOID TAPER INSTRUCTIONS: It is important to wean off of your opioid medication as soon as possible. If you do not need pain medication after your surgery it is ok to stop day one. Opioids include: Codeine, Hydrocodone(Norco, Vicodin), Oxycodone(Percocet, oxycontin) and hydromorphone amongst others.  Long term and even short term use of opiods can  cause: Increased pain response Dependence Constipation Depression Respiratory depression And more.  Withdrawal symptoms can include Flu like symptoms Nausea, vomiting And more Techniques to manage these symptoms Hydrate well Eat regular healthy meals Stay active Use relaxation techniques(deep breathing, meditating, yoga) Do Not substitute Alcohol to help with tapering If you have been on opioids for less than two weeks and do not have pain than it is ok to stop all together.  Plan  to wean off of opioids This plan should start within one week post op of your joint replacement. Maintain the same interval or time between taking each dose and first decrease the dose.  Cut the total daily intake of opioids by one tablet each day Next start to increase the time between doses. The last dose that should be eliminated is the evening dose.   IF YOU ARE TRANSFERRED TO A SKILLED REHAB FACILITY If the patient is transferred to a skilled rehab facility following release from the hospital, a list of the current medications will be sent to the facility for the patient to continue.  When discharged from the skilled rehab facility, please have the facility set up the patient's Home Health Physical Therapy prior to being released. Also, the skilled facility will be responsible for providing the patient with their medications at time of release from the facility to include their pain medication, the muscle relaxants, and their blood thinner medication. If the patient is still at the rehab facility at time of the two week follow up appointment, the skilled rehab facility will also need to assist the patient in arranging follow up appointment in our office and any transportation needs.  MAKE SURE YOU:  Understand these instructions.  Get help right away if you are not doing well or get worse.   DENTAL ANTIBIOTICS:  In most cases prophylactic antibiotics for Dental procdeures after total joint surgery are  not necessary.  Exceptions are as follows:  1. History of prior total joint infection  2. Severely immunocompromised (Organ Transplant, cancer chemotherapy, Rheumatoid biologic meds such as Humera)  3. Poorly controlled diabetes (A1C &gt; 8.0, blood glucose over 200)  If you have one of these conditions, contact your surgeon for an antibiotic prescription, prior to your dental procedure.    Pick up stool softner and laxative for home use following surgery while on pain medications. Do not submerge incision under water. Please use good hand washing techniques while changing dressing each day. May shower starting three days after surgery. Please use a clean towel to pat the incision dry following showers. Continue to use ice for pain and swelling after surgery. Do not use any lotions or creams on the incision until instructed by your surgeon.  

## 2023-03-21 ENCOUNTER — Encounter (HOSPITAL_COMMUNITY): Payer: Self-pay | Admitting: Orthopedic Surgery

## 2023-03-21 DIAGNOSIS — M1712 Unilateral primary osteoarthritis, left knee: Secondary | ICD-10-CM | POA: Diagnosis not present

## 2023-03-21 MED ORDER — ONDANSETRON HCL 4 MG PO TABS: 4.0000 mg | ORAL_TABLET | Freq: Four times a day (QID) | ORAL | 0 refills | Status: DC | PRN

## 2023-03-21 MED ORDER — GABAPENTIN 300 MG PO CAPS: ORAL_CAPSULE | ORAL | 0 refills | Status: DC

## 2023-03-21 MED ORDER — OXYCODONE HCL 5 MG PO TABS: 5.0000 mg | ORAL_TABLET | Freq: Four times a day (QID) | ORAL | 0 refills | Status: DC | PRN

## 2023-03-21 MED ORDER — ASPIRIN 81 MG PO CHEW: 81.0000 mg | CHEWABLE_TABLET | Freq: Two times a day (BID) | ORAL | 0 refills | Status: AC

## 2023-03-21 MED ORDER — METHOCARBAMOL 500 MG PO TABS: 500.0000 mg | ORAL_TABLET | Freq: Four times a day (QID) | ORAL | 0 refills | Status: DC | PRN

## 2023-03-21 NOTE — Progress Notes (Signed)
Subjective: 1 Day Post-Op Procedure(s) (LRB): TOTAL KNEE ARTHROPLASTY (Left) Patient reports pain as moderate.   Patient seen in rounds by Dr. Lequita Halt. Patient is having issues with pain, anesthetic block seems to have worn off early last night. No other issues, pain has remained reasonably controlled with current medication regimen. Foley catheter removed this AM. We will continue therapy today, ambulated 12' yesterday.  Objective: Vital signs in last 24 hours: Temp:  [97.5 F (36.4 C)-99.9 F (37.7 C)] 99.7 F (37.6 C) (08/06 0556) Pulse Rate:  [57-85] 83 (08/06 0556) Resp:  [10-18] 17 (08/06 0556) BP: (102-157)/(66-92) 131/83 (08/06 0556) SpO2:  [92 %-100 %] 97 % (08/06 0556) FiO2 (%):  [28 %] 28 % (08/05 1139)  Intake/Output from previous day:  Intake/Output Summary (Last 24 hours) at 03/21/2023 0815 Last data filed at 03/21/2023 0748 Gross per 24 hour  Intake 2852.5 ml  Output 3750 ml  Net -897.5 ml     Intake/Output this shift: Total I/O In: 135 [I.V.:135] Out: -   Labs: Recent Labs    03/21/23 0322  HGB 10.4*   Recent Labs    03/21/23 0322  WBC 10.0  RBC 4.09  HCT 34.0*  PLT 268   Recent Labs    03/21/23 0322  NA 132*  K 3.8  CL 94*  CO2 30  BUN 14  CREATININE 1.16*  GLUCOSE 132*  CALCIUM 8.3*   No results for input(s): "LABPT", "INR" in the last 72 hours.  Exam: General - Patient is Alert and Oriented Extremity - Neurologically intact Neurovascular intact Sensation intact distally Dorsiflexion/Plantar flexion intact Dressing - dressing C/D/I Motor Function - intact, moving foot and toes well on exam.   Past Medical History:  Diagnosis Date   Allergy    Anxiety    Arthritis    Depression    Family history of breast cancer    Family history of colon cancer    Family history of pancreatic cancer    GERD (gastroesophageal reflux disease)    Heart murmur    per PCP   Hypertension    Joint pain    Obstructive sleep apnea  10/06/2022   Rheumatoid arthritis (HCC)     Assessment/Plan: 1 Day Post-Op Procedure(s) (LRB): TOTAL KNEE ARTHROPLASTY (Left) Principal Problem:   OA (osteoarthritis) of knee Active Problems:   Primary osteoarthritis of left knee  Estimated body mass index is 40.05 kg/m as calculated from the following:   Height as of this encounter: 5' 4.5" (1.638 m).   Weight as of this encounter: 107.5 kg. Advance diet Up with therapy D/C IV fluids   Patient's anticipated LOS is less than 2 midnights, meeting these requirements: - Younger than 53 - Lives within 1 hour of care - Has a competent adult at home to recover with post-op recover - NO history of  - Chronic pain requiring opioids  - Diabetes  - Coronary Artery Disease  - Heart failure  - Heart attack  - Stroke  - DVT/VTE  - Cardiac arrhythmia  - Respiratory Failure/COPD  - Renal failure  - Anemia  - Advanced Liver disease  DVT Prophylaxis - Aspirin Weight bearing as tolerated. Continue therapy.  Plan is to go Home after hospital stay. Possible discharge this afternoon if progresses with therapy and pain is improved.  Scheduled for OPPT at [facility] F/u in the office in 2 weeks  The PDMP database was reviewed today prior to any opioid medications being prescribed to this patient.  Danford Bad  [facility] [phone] 03/21/2023, 8:15 AM

## 2023-03-21 NOTE — Progress Notes (Signed)
Physical Therapy Treatment Patient Details Name: Aimee [name] MRN: [id number] DOB: [birth year] Today's Date: 03/21/2023   History of Present Illness 62 y.o. female admitted 03/20/23 for L TKA. PMH: OSO on CPAP, morbid obesity, HTN, anxiety, RA.    PT Comments  Pt making gradual progress but is limited by pain and would benefit from further therapy prior to return home.  Session focused on transfers and ambulation 30' with RW, minimum exercises with assist due to pain.  Continue plan of care.     If plan is discharge home, recommend the following: A little help with walking and/or transfers;A little help with bathing/dressing/bathroom;Assistance with cooking/housework;Help with stairs or ramp for entrance;Assist for transportation   Can travel by private vehicle        Equipment Recommendations  Rolling walker (2 wheels)    Recommendations for Other Services       Precautions / Restrictions Precautions Precautions: Knee Restrictions Other Position/Activity Restrictions: WBAT     Mobility  Bed Mobility Overal bed mobility: Needs Assistance Bed Mobility: Sit to Supine       Sit to supine: Min assist   General bed mobility comments: On BSC at arrival, with return to bed min A for L LE    Transfers Overall transfer level: Needs assistance Equipment used: Rolling walker (2 wheels) Transfers: Sit to/from Stand Sit to Stand: Min guard, From elevated surface           General transfer comment: Cues for L LE management; painful with sit to/from stand    Ambulation/Gait Ambulation/Gait assistance: Min guard Gait Distance (Feet): 30 Feet Assistive device: Rolling walker (2 wheels) Gait Pattern/deviations: Step-to pattern, Decreased stance time - left, Decreased weight shift to left, Antalgic Gait velocity: decreased     General Gait Details: Distance limited by pain; min cues RW proximity   Stairs             Wheelchair Mobility     Tilt Bed     Modified Rankin (Stroke Patients Only)       Balance Overall balance assessment: Needs assistance Sitting-balance support: No upper extremity supported Sitting balance-Leahy Scale: Good     Standing balance support: Bilateral upper extremity supported, Reliant on assistive device for balance Standing balance-Leahy Scale: Poor                              Cognition Arousal/Alertness: Awake/alert Behavior During Therapy: WFL for tasks assessed/performed Overall Cognitive Status: Within Functional Limits for tasks assessed                                          Exercises Total Joint Exercises Ankle Circles/Pumps: AROM, Both, 10 reps, Seated Long Arc Quad: AAROM, Left, 10 reps, Seated (gentle ROM with Mod AAROM) Knee Flexion: AAROM, Left, 10 reps, Seated (gentle ROM with assist) Goniometric ROM: L knee ~ 10 to 50 degrees limited by pain    General Comments General comments (skin integrity, edema, etc.): Pt with some mild c/o "wooziness and weakness."  BP was 133/85 in sitting. Tolerated activity      Pertinent Vitals/Pain Pain Assessment Pain Assessment: 0-10 Pain Score: 8  Pain Location: L knee Pain Descriptors / Indicators: Discomfort, Grimacing Pain Intervention(s): Limited activity within patient's tolerance, Repositioned, Ice applied, Monitored during session, Premedicated before session    Home Living  Prior Function            PT Goals (current goals can now be found in the care plan section) Progress towards PT goals: Progressing toward goals    Frequency    7X/week      PT Plan Current plan remains appropriate    Co-evaluation              AM-PAC PT "6 Clicks" Mobility   Outcome Measure  Help needed turning from your back to your side while in a flat bed without using bedrails?: A Little Help needed moving from lying on your back to sitting on the side of a flat bed  without using bedrails?: A Little Help needed moving to and from a bed to a chair (including a wheelchair)?: A Little Help needed standing up from a chair using your arms (e.g., wheelchair or bedside chair)?: A Little Help needed to walk in hospital room?: A Little Help needed climbing 3-5 steps with a railing? : A Lot 6 Click Score: 17    End of Session Equipment Utilized During Treatment: Gait belt Activity Tolerance: Patient limited by pain Patient left: in bed;with call bell/phone within reach;with family/visitor present;with bed alarm set Nurse Communication: Mobility status PT Visit Diagnosis: Pain;Difficulty in walking, not elsewhere classified (R26.2) Pain - Right/Left: Left Pain - part of body: Knee     Time: 1030-1056 PT Time Calculation (min) (ACUTE ONLY): 26 min  Charges:    $Gait Training: 8-22 mins $Therapeutic Exercise: 8-22 mins PT General Charges $$ ACUTE PT VISIT: 1 Visit                     Anise Salvo, PT Acute Rehab Services [facility] [phone]    Rayetta Humphrey 03/21/2023, 11:03 AM

## 2023-03-21 NOTE — Progress Notes (Signed)
Physical Therapy Treatment Patient Details Name: Aimee [name] MRN: [id number] DOB: [birth year] Today's Date: 03/21/2023   History of Present Illness 62 y.o. female admitted 03/20/23 for L TKA. PMH: OSO on CPAP, morbid obesity, HTN, anxiety, RA.    PT Comments  Pt feeling much better this afternoon with improved pain control.  She was able to ambulate 33' safely with RW and performed 4 steps similar to home setup.  Pt demonstrates safe gait & transfers in order to return home from PT perspective once discharged by MD.  While in hospital, will continue to benefit from PT for skilled therapy to advance mobility and exercises.       If plan is discharge home, recommend the following: A little help with walking and/or transfers;A little help with bathing/dressing/bathroom;Assistance with cooking/housework;Help with stairs or ramp for entrance;Assist for transportation   Can travel by private vehicle        Equipment Recommendations  Rolling walker (2 wheels)    Recommendations for Other Services       Precautions / Restrictions Precautions Precautions: Knee Restrictions Other Position/Activity Restrictions: WBAT     Mobility  Bed Mobility Overal bed mobility: Needs Assistance Bed Mobility: Supine to Sit     Supine to sit: Supervision Sit to supine: Min assist   General bed mobility comments: On BSC at arrival, with return to bed min A for L LE    Transfers Overall transfer level: Needs assistance Equipment used: Rolling walker (2 wheels) Transfers: Sit to/from Stand Sit to Stand: Supervision           General transfer comment: Good recall hand placement    Ambulation/Gait Ambulation/Gait assistance: Supervision Gait Distance (Feet): 75 Feet Assistive device: Rolling walker (2 wheels) Gait Pattern/deviations: Step-to pattern, Decreased stance time - left, Decreased weight shift to left, Antalgic Gait velocity: decreased but functional     General Gait Details:  Min cues RW proximity initially then good recall   Stairs Stairs: Yes Stairs assistance: Min guard Stair Management: Two rails, Step to pattern, Forwards Number of Stairs: 4 General stair comments: Educated on sequence, tolerated well   Wheelchair Mobility     Tilt Bed    Modified Rankin (Stroke Patients Only)       Balance Overall balance assessment: Needs assistance Sitting-balance support: No upper extremity supported Sitting balance-Leahy Scale: Good     Standing balance support: Bilateral upper extremity supported, Reliant on assistive device for balance, No upper extremity supported Standing balance-Leahy Scale: Fair Standing balance comment: RWto ambulate but able to static stand without support                            Cognition Arousal/Alertness: Awake/alert Behavior During Therapy: WFL for tasks assessed/performed Overall Cognitive Status: Within Functional Limits for tasks assessed                                          Exercises Total Joint Exercises Ankle Circles/Pumps: AROM, Both, 10 reps, Supine Quad Sets: AROM, Both, 10 reps, Supine Heel Slides: AAROM, Left, 10 reps, Supine Hip ABduction/ADduction: AAROM, Left, Supine, 10 reps Long Arc Quad: Left, 10 reps, Seated, AROM Knee Flexion: AAROM, Left, 10 reps, Seated Goniometric ROM: L knee ~5 to 50 degrees Other Exercises Other Exercises: Exercises to tolerance; educated on gait belt for AAROM as needed  General Comments General comments (skin integrity, edema, etc.): Pt denies dizziness this afternoon.   Educated on safe ice use, no pivots, car transfers, resting with leg straight, and TED hose during day. Also, encouraged walking every 1-2 hours during day. Educated on HEP with focus on mobility the first weeks. Discussed doing exercises within pain control and if pain increasing could decreased ROM, reps, and stop exercises as needed. Encouraged to perform quad sets  and ankle pumps frequently for blood flow and to promote full knee extension.      Pertinent Vitals/Pain Pain Assessment Pain Assessment: 0-10 Pain Score: 3  Pain Location: L knee Pain Descriptors / Indicators: Discomfort Pain Intervention(s): Limited activity within patient's tolerance, Monitored during session, Premedicated before session, Ice applied    Home Living                          Prior Function            PT Goals (current goals can now be found in the care plan section) Progress towards PT goals: Progressing toward goals    Frequency    7X/week      PT Plan Current plan remains appropriate    Co-evaluation              AM-PAC PT "6 Clicks" Mobility   Outcome Measure  Help needed turning from your back to your side while in a flat bed without using bedrails?: A Little Help needed moving from lying on your back to sitting on the side of a flat bed without using bedrails?: A Little Help needed moving to and from a bed to a chair (including a wheelchair)?: A Little Help needed standing up from a chair using your arms (e.g., wheelchair or bedside chair)?: A Little Help needed to walk in hospital room?: A Little Help needed climbing 3-5 steps with a railing? : A Little 6 Click Score: 18    End of Session Equipment Utilized During Treatment: Gait belt Activity Tolerance: Patient tolerated treatment well Patient left: with call bell/phone within reach;in chair;with chair alarm set Nurse Communication: Mobility status PT Visit Diagnosis: Pain;Difficulty in walking, not elsewhere classified (R26.2) Pain - Right/Left: Left Pain - part of body: Knee     Time: 1354-1430 PT Time Calculation (min) (ACUTE ONLY): 36 min  Charges:    $Gait Training: 8-22 mins $Therapeutic Exercise: 8-22 mins PT General Charges $$ ACUTE PT VISIT: 1 Visit                     Anise Salvo, PT Acute Rehab Services [facility] [phone]    Rayetta Humphrey 03/21/2023, 2:37 PM

## 2023-03-21 NOTE — Plan of Care (Signed)
  Problem: Education: Goal: Knowledge of the prescribed therapeutic regimen will improve Outcome: Progressing Goal: Individualized Educational Video(s) Outcome: Completed/Met   Problem: Activity: Goal: Ability to avoid complications of mobility impairment will improve Outcome: Progressing Goal: Range of joint motion will improve Outcome: Adequate for Discharge   Problem: Clinical Measurements: Goal: Postoperative complications will be avoided or minimized Outcome: Progressing   Problem: Pain Management: Goal: Pain level will decrease with appropriate interventions Outcome: Progressing   Problem: Skin Integrity: Goal: Will show signs of wound healing Outcome: Progressing   Problem: Education: Goal: Knowledge of General Education information will improve Description: Including pain rating scale, medication(s)/side effects and non-pharmacologic comfort measures Outcome: Progressing   Problem: Health Behavior/Discharge Planning: Goal: Ability to manage health-related needs will improve Outcome: Progressing   Problem: Clinical Measurements: Goal: Ability to maintain clinical measurements within normal limits will improve Outcome: Progressing Goal: Will remain free from infection Outcome: Progressing Goal: Diagnostic test results will improve Outcome: Progressing Goal: Respiratory complications will improve Outcome: Progressing Goal: Cardiovascular complication will be avoided Outcome: Progressing   Problem: Activity: Goal: Risk for activity intolerance will decrease Outcome: Progressing   Problem: Nutrition: Goal: Adequate nutrition will be maintained Outcome: Completed/Met   Problem: Coping: Goal: Level of anxiety will decrease Outcome: Progressing   Problem: Elimination: Goal: Will not experience complications related to bowel motility Outcome: Progressing Goal: Will not experience complications related to urinary retention Outcome: Progressing   Problem:  Pain Managment: Goal: General experience of comfort will improve Outcome: Progressing   Problem: Safety: Goal: Ability to remain free from injury will improve Outcome: Progressing   Problem: Skin Integrity: Goal: Risk for impaired skin integrity will decrease Outcome: Adequate for Discharge

## 2023-03-21 NOTE — TOC Transition Note (Signed)
Transition of Care [facility]) - CM/SW Discharge Note   Patient Details  Name: TWAN MASLOW MRN: [id number] Date of Birth: 1961-05-15  Transition of Care [facility]) CM/SW Contact:  Amada Jupiter, LCSW Phone Number: 03/21/2023, 10:07 AM   Clinical Narrative:    Met with pt who confirms need for RW and no DME agency preference - order placed with [employer] for delivery to room.  OPPT already arranged with [facility].  No further TOC needs.   Final next level of care: OP Rehab Barriers to Discharge: No Barriers Identified   Patient Goals and CMS Choice      Discharge Placement                         Discharge Plan and Services Additional resources added to the After Visit Summary for                  DME Arranged: Walker rolling DME Agency: [employer] Date DME Agency Contacted: 03/21/23 Time DME Agency Contacted: 0930 Representative spoke with at DME Agency: Yvonna Alanis            Social Determinants of Health (SDOH) Interventions SDOH Screenings   Food Insecurity: No Food Insecurity (03/20/2023)  Housing: Low Risk  (03/20/2023)  Transportation Needs: No Transportation Needs (03/20/2023)  Utilities: Not At Risk (03/20/2023)  Depression (PHQ2-9): Low Risk  (11/29/2022)  Tobacco Use: Low Risk  (03/20/2023)     Readmission Risk Interventions     No data to display

## 2023-03-22 ENCOUNTER — Telehealth: Payer: Self-pay

## 2023-03-22 NOTE — Transitions of Care (Post Inpatient/ED Visit) (Signed)
   03/22/2023  Name: Aimee [name] MRN: [id number] DOB: Jul 15, 1961  Today's TOC FU Call Status: Today's TOC FU Call Status:: Unsuccessful Call (1st Attempt) Unsuccessful Call (1st Attempt) Date: 03/22/23  Attempted to reach the patient regarding the most recent Inpatient/ED visit.  Follow Up Plan: Additional outreach attempts will be made to reach the patient to complete the Transitions of Care (Post Inpatient/ED visit) call.   Signature  TB,CMA

## 2023-03-23 ENCOUNTER — Telehealth: Payer: Self-pay

## 2023-03-23 NOTE — Transitions of Care (Post Inpatient/ED Visit) (Signed)
   03/23/2023  Name: Aimee [name] MRN: [id number] DOB: [birth year]  Today's TOC FU Call Status: Today's TOC FU Call Status:: Unsuccessful Call (2nd Attempt) Unsuccessful Call (2nd Attempt) Date: 03/23/23  Attempted to reach the patient regarding the most recent Inpatient/ED visit.  Follow Up Plan: Additional outreach attempts will be made to reach the patient to complete the Transitions of Care (Post Inpatient/ED visit) call.   Signature  tb,cma

## 2023-03-24 ENCOUNTER — Telehealth: Payer: Self-pay

## 2023-03-24 NOTE — Transitions of Care (Post Inpatient/ED Visit) (Signed)
   03/24/2023  Name: Aimee [name] MRN: [id number] DOB: 02-11-1961  Today's TOC FU Call Status: Today's TOC FU Call Status:: Unsuccessful Call (3rd Attempt) Unsuccessful Call (3rd Attempt) Date: 03/24/23  Attempted to reach the patient regarding the most recent Inpatient/ED visit.  Follow Up Plan: No further outreach attempts will be made at this time. We have been unable to contact the patient.  Signature  tb,cma

## 2023-03-27 NOTE — Discharge Summary (Signed)
Patient ID: Aimee [name] MRN: [id number] DOB/AGE: Jul 09, 1961 62 y.o.  Admit date: 03/20/2023 Discharge date: 03/21/2023  Admission Diagnoses:  Principal Problem:   OA (osteoarthritis) of knee Active Problems:   Primary osteoarthritis of left knee   Discharge Diagnoses:  Same  Past Medical History:  Diagnosis Date   Allergy    Anxiety    Arthritis    Depression    Family history of breast cancer    Family history of colon cancer    Family history of pancreatic cancer    GERD (gastroesophageal reflux disease)    Heart murmur    per PCP   Hypertension    Joint pain    Obstructive sleep apnea 10/06/2022   Rheumatoid arthritis (HCC)     Surgeries: Procedure(s): TOTAL KNEE ARTHROPLASTY on 03/20/2023   Consultants:   Discharged Condition: Improved  Hospital Course: Aimee [name] is an 62 y.o. female who was admitted 03/20/2023 for operative treatment ofOA (osteoarthritis) of knee. Patient has severe unremitting pain that affects sleep, daily activities, and work/hobbies. After pre-op clearance the patient was taken to the operating room on 03/20/2023 and underwent  Procedure(s): TOTAL KNEE ARTHROPLASTY.    Patient was given perioperative antibiotics:  Anti-infectives (From admission, onward)    Start     Dose/Rate Route Frequency Ordered Stop   03/20/23 1330  ceFAZolin (ANCEF) IVPB 2g/100 mL premix        2 g 200 mL/hr over 30 Minutes Intravenous Every 6 hours 03/20/23 1110 03/20/23 2051   03/20/23 0600  ceFAZolin (ANCEF) IVPB 2g/100 mL premix        2 g 200 mL/hr over 30 Minutes Intravenous On call to O.R. 03/20/23 8295 03/20/23 0758        Patient was given sequential compression devices, early ambulation, and chemoprophylaxis to prevent DVT.  Patient benefited maximally from hospital stay and there were no complications.    Recent vital signs: No data found.   Recent laboratory studies: No results for input(s): "WBC", "HGB", "HCT", "PLT", "NA", "K", "CL", "CO2",  "BUN", "CREATININE", "GLUCOSE", "INR", "CALCIUM" in the last 72 hours.  Invalid input(s): "PT", "2"   Discharge Medications:   Allergies as of 03/21/2023   No Known Allergies      Medication List     STOP taking these medications    Aleve PM 220-25 MG Tabs Generic drug: Naproxen Sod-diphenhydrAMINE   ibuprofen 200 MG tablet Commonly known as: ADVIL       TAKE these medications    albuterol 108 (90 Base) MCG/ACT inhaler Commonly known as: VENTOLIN HFA INHALE 2 PUFFS INTO THE LUNGS EVERY 6 HOURS AS NEEDED FOR WHEEZING OR SHORTNESS OF BREATH   ALPRAZolam 0.5 MG tablet Commonly known as: XANAX Take 0.5 mg by mouth 4 (four) times daily as needed for anxiety.   amLODipine 10 MG tablet Commonly known as: NORVASC Take 1 tablet (10 mg total) by mouth daily. What changed: when to take this   aspirin 81 MG chewable tablet Chew 1 tablet (81 mg total) by mouth 2 (two) times daily for 20 days. Then take one 81 mg aspirin once a day for three weeks. Then discontinue aspirin.   ferrous sulfate 325 (65 FE) MG tablet Take 325 mg by mouth daily.   FLUoxetine 40 MG capsule Commonly known as: PROZAC Take 40 mg by mouth daily.   gabapentin 300 MG capsule Commonly known as: NEURONTIN Take a 300 mg capsule three times a day for two weeks following surgery.Then  take a 300 mg capsule two times a day for two weeks. Then take a 300 mg capsule once a day for two weeks. Then discontinue.   hydrochlorothiazide 25 MG tablet Commonly known as: HYDRODIURIL Take 1 tablet (25 mg total) by mouth daily.   methocarbamol 500 MG tablet Commonly known as: ROBAXIN Take 1 tablet (500 mg total) by mouth every 6 (six) hours as needed for muscle spasms.   montelukast 10 MG tablet Commonly known as: SINGULAIR TAKE 1 TABLET(10 MG) BY MOUTH AT BEDTIME   multivitamin tablet Take 1 tablet by mouth daily.   omeprazole 20 MG capsule Commonly known as: PRILOSEC Take 20 mg by mouth daily.    ondansetron 4 MG tablet Commonly known as: ZOFRAN Take 1 tablet (4 mg total) by mouth every 6 (six) hours as needed for nausea.   oxyCODONE 5 MG immediate release tablet Commonly known as: Oxy IR/ROXICODONE Take 1-2 tablets (5-10 mg total) by mouth every 6 (six) hours as needed for severe pain.   Qvar RediHaler 40 MCG/ACT inhaler Generic drug: beclomethasone Inhale 1 puff into the lungs 2 (two) times daily.   triamcinolone cream 0.1 % Commonly known as: KENALOG Apply 1 Application topically daily.   Vitamin D3 25 MCG (1000 UT) Caps Take 1 capsule (1,000 Units total) by mouth daily.               Discharge Care Instructions  (From admission, onward)           Start     Ordered   03/21/23 0000  Weight bearing as tolerated        03/21/23 0818   03/21/23 0000  Change dressing       Comments: You may remove the bulky bandage (ACE wrap and gauze) two days after surgery. You will have an adhesive waterproof bandage underneath. Leave this in place until your first follow-up appointment.   03/21/23 0818            Diagnostic Studies: No results found.  Disposition: Discharge disposition: 01-Home or Self Care       Discharge Instructions     Call MD / Call 911   Complete by: As directed    If you experience chest pain or shortness of breath, CALL 911 and be transported to the hospital emergency room.  If you develope a fever above 101 F, pus (white drainage) or increased drainage or redness at the wound, or calf pain, call your surgeon's office.   Change dressing   Complete by: As directed    You may remove the bulky bandage (ACE wrap and gauze) two days after surgery. You will have an adhesive waterproof bandage underneath. Leave this in place until your first follow-up appointment.   Constipation Prevention   Complete by: As directed    Drink plenty of fluids.  Prune juice may be helpful.  You may use a stool softener, such as Colace (over the counter) 100  mg twice a day.  Use MiraLax (over the counter) for constipation as needed.   Diet - low sodium heart healthy   Complete by: As directed    Do not put a pillow under the knee. Place it under the heel.   Complete by: As directed    Driving restrictions   Complete by: As directed    No driving for two weeks   Post-operative opioid taper instructions:   Complete by: As directed    POST-OPERATIVE OPIOID TAPER INSTRUCTIONS: It is important to  wean off of your opioid medication as soon as possible. If you do not need pain medication after your surgery it is ok to stop day one. Opioids include: Codeine, Hydrocodone(Norco, Vicodin), Oxycodone(Percocet, oxycontin) and hydromorphone amongst others.  Long term and even short term use of opiods can cause: Increased pain response Dependence Constipation Depression Respiratory depression And more.  Withdrawal symptoms can include Flu like symptoms Nausea, vomiting And more Techniques to manage these symptoms Hydrate well Eat regular healthy meals Stay active Use relaxation techniques(deep breathing, meditating, yoga) Do Not substitute Alcohol to help with tapering If you have been on opioids for less than two weeks and do not have pain than it is ok to stop all together.  Plan to wean off of opioids This plan should start within one week post op of your joint replacement. Maintain the same interval or time between taking each dose and first decrease the dose.  Cut the total daily intake of opioids by one tablet each day Next start to increase the time between doses. The last dose that should be eliminated is the evening dose.      TED hose   Complete by: As directed    Use stockings (TED hose) for three weeks on both leg(s).  You may remove them at night for sleeping.   Weight bearing as tolerated   Complete by: As directed         Follow-up Information     Aluisio, [name], MD. Schedule an appointment as soon as possible for a  visit in 2 week(s).   Specialty: Orthopedic Surgery Contact information: [address] [place] [address] [phone]                  Signed: Arther Abbott 03/27/2023, 8:23 AM

## 2023-04-18 ENCOUNTER — Ambulatory Visit: Payer: 59 | Admitting: Pulmonary Disease

## 2023-04-18 ENCOUNTER — Encounter: Payer: Self-pay | Admitting: Pulmonary Disease

## 2023-04-18 VITALS — BP 110/74 | HR 84 | Ht 65.0 in | Wt 229.0 lb

## 2023-04-18 DIAGNOSIS — G4733 Obstructive sleep apnea (adult) (pediatric): Secondary | ICD-10-CM | POA: Diagnosis not present

## 2023-04-18 NOTE — Progress Notes (Signed)
Aimee [name]    [id number]    1961-02-16  Primary Care Physician:Worley, Lelon Mast, PA  Referring Physician: [name], PA [address] [place],  [place] [address]  Chief complaint:   Patient with mild obstructive sleep apnea in for follow-up  HPI:  Diagnosed with mild obstructive sleep apnea with an AHI of 12.8 in February 2024  She did use CPAP for about 3 weeks and could not tolerate it It was making a cough multiple attempts of trying to get used to it were unsuccessful  She has focused on weight loss and Weight is down over 40 pounds  She feels well today  Discussed other options of treatment for mild obstructive sleep apnea including surgical interventions, and oral device, focus on weight loss efforts which she is doing at present  Breathing feels fair  Previous symptoms included dryness of her mouth, loud snoring, daytime fatigue, wakes up feeling poorly rested  She feels overall symptoms are better with her weight loss  She does have a history of hypertension, GERD  Never smoker  Outpatient Encounter Medications as of 04/18/2023  Medication Sig   albuterol (VENTOLIN HFA) 108 (90 Base) MCG/ACT inhaler INHALE 2 PUFFS INTO THE LUNGS EVERY 6 HOURS AS NEEDED FOR WHEEZING OR SHORTNESS OF BREATH   ALPRAZolam (XANAX) 0.5 MG tablet Take 0.5 mg by mouth 4 (four) times daily as needed for anxiety.   amLODipine (NORVASC) 10 MG tablet Take 1 tablet (10 mg total) by mouth daily. (Patient taking differently: Take 10 mg by mouth every evening.)   beclomethasone (QVAR REDIHALER) 40 MCG/ACT inhaler Inhale 1 puff into the lungs 2 (two) times daily.   Cholecalciferol (VITAMIN D3) 25 MCG (1000 UT) capsule Take 1 capsule (1,000 Units total) by mouth daily.   ferrous sulfate 325 (65 FE) MG tablet Take 325 mg by mouth daily.   FLUoxetine (PROZAC) 40 MG capsule Take 40 mg by mouth daily.   hydrochlorothiazide (HYDRODIURIL) 25 MG tablet Take 1 tablet (25 mg total)  by mouth daily.   montelukast (SINGULAIR) 10 MG tablet TAKE 1 TABLET(10 MG) BY MOUTH AT BEDTIME   Multiple Vitamin (MULTIVITAMIN) tablet Take 1 tablet by mouth daily.   omeprazole (PRILOSEC) 20 MG capsule Take 20 mg by mouth daily.   triamcinolone (KENALOG) 0.1 % Apply 1 Application topically daily.   gabapentin (NEURONTIN) 300 MG capsule Take a 300 mg capsule three times a day for two weeks following surgery.Then take a 300 mg capsule two times a day for two weeks. Then take a 300 mg capsule once a day for two weeks. Then discontinue. (Patient not taking: Reported on 04/18/2023)   methocarbamol (ROBAXIN) 500 MG tablet Take 1 tablet (500 mg total) by mouth every 6 (six) hours as needed for muscle spasms. (Patient not taking: Reported on 04/18/2023)   ondansetron (ZOFRAN) 4 MG tablet Take 1 tablet (4 mg total) by mouth every 6 (six) hours as needed for nausea. (Patient not taking: Reported on 04/18/2023)   oxyCODONE (OXY IR/ROXICODONE) 5 MG immediate release tablet Take 1-2 tablets (5-10 mg total) by mouth every 6 (six) hours as needed for severe pain. (Patient not taking: Reported on 04/18/2023)   No facility-administered encounter medications on file as of 04/18/2023.    Allergies as of 04/18/2023   (No Known Allergies)    Past Medical History:  Diagnosis Date   Allergy    Anxiety    Arthritis    Depression  Family history of breast cancer    Family history of colon cancer    Family history of pancreatic cancer    GERD (gastroesophageal reflux disease)    Heart murmur    per PCP   Hypertension    Joint pain    Obstructive sleep apnea 10/06/2022   Rheumatoid arthritis [facility])     Past Surgical History:  Procedure Laterality Date   ABDOMINAL HYSTERECTOMY  2004   arm surgery Left    CESAREAN SECTION  2003   KNEE ARTHROSCOPY Right 2013   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   Procedure: TOTAL KNEE ARTHROPLASTY;  Surgeon: Ollen Gross, MD;  Location: [facility] ORS;  Service: Orthopedics;   Laterality: Left;    Family History  Problem Relation Age of Onset   Colon cancer Mother 54   Cancer Mother    Diabetes Mother    High blood pressure Mother    Obesity Mother    Heart disease Father        CHF   Hypertension Father    Heart attack Father 36   Stroke Father    Alcoholism Father    Diabetes Sister    Breast cancer Sister 71   Congestive Heart Failure Sister    Heart attack Sister    Pancreatic cancer Sister 44   Heart failure Brother    Heart disease Brother        stent   Heart Problems Paternal Uncle    Colon cancer Maternal Grandfather        dx >50    Social History   Socioeconomic History   Marital status: Married    Spouse name: Not on file   Number of children: Not on file   Years of education: Not on file   Highest education level: Not on file  Occupational History   Not on file  Tobacco Use   Smoking status: Never   Smokeless tobacco: Never  Vaping Use   Vaping status: Never Used  Substance and Sexual Activity   Alcohol use: No   Drug use: No   Sexual activity: Yes    Birth control/protection: Surgical  Other Topics Concern   Not on file  Social History Narrative   Works for the [employer] -- working there for 24 years   Married, 2 kids (34 and 15) and 1 grandson   Social Determinants of Corporate investment banker Strain: Not on file  Food Insecurity: No Food Insecurity (03/20/2023)   Hunger Vital Sign    Worried About Running Out of Food in the Last Year: Never true    Ran Out of Food in the Last Year: Never true  Transportation Needs: No Transportation Needs (03/20/2023)   PRAPARE - Administrator, Civil Service (Medical): No    Lack of Transportation (Non-Medical): No  Physical Activity: Not on file  Stress: Not on file  Social Connections: Not on file  Intimate Partner Violence: Not At Risk (03/20/2023)   Humiliation, Afraid, Rape, and Kick questionnaire    Fear of Current or Ex-Partner: No    Emotionally  Abused: No    Physically Abused: No    Sexually Abused: No    Review of Systems  Respiratory:  Positive for apnea.   Psychiatric/Behavioral:  Positive for sleep disturbance.     Vitals:   04/18/23 1552  BP: 110/74  Pulse: 84  SpO2: 98%     Physical Exam Constitutional:      Appearance: She  is obese.  HENT:     Head: Normocephalic.     Mouth/Throat:     Mouth: Mucous membranes are moist.  Eyes:     General: No scleral icterus. Cardiovascular:     Rate and Rhythm: Normal rate and regular rhythm.     Heart sounds: No murmur heard.    No friction rub.  Pulmonary:     Effort: No respiratory distress.     Breath sounds: No stridor. No wheezing or rhonchi.  Musculoskeletal:     Cervical back: No rigidity or tenderness.  Neurological:     Mental Status: She is alert.  Psychiatric:        Mood and Affect: Mood normal.    Sleep study reviewed  Data Reviewed: Compliance data shows she has not been using CPAP since April it was AutoSet 4-8 Average use of 6 hours 16 minutes AHI of 1.6  Assessment:  Mild obstructive sleep apnea  Significant weight loss of over 40 pounds since the study  She was intolerant of CPAP therapy  Feels symptoms are better at present following weight loss  Plan/Recommendations: May discontinue CPAP by patient's request  Options of treatment including oral device, surgical intervention, continue to focus on weight loss efforts  The more weight she loses the less risk with sleep disordered breathing AHI should continue to improve with weight loss  Tentative follow-up in a year  Appointment may be canceled if she is feeling well with no significant concerns for sleep apnea at that time  Encouraged to give [place] a call with any significant concerns   Virl Diamond MD [facility] 04/18/2023, 4:16 PM  CC: [name], PA

## 2023-04-18 NOTE — Patient Instructions (Signed)
I will tentatively see you in about a year from now  You can cancel the appointment if you are feeling healthy and no symptoms suggesting sleep apnea at the time  With significant weight loss, the number of events that was noted on the sleep study will be getting better  You can call the medical supply company to pick up the machine if you are not can to be able to use it  Continue to focus on weight loss  Other options of treatment for mild sleep apnea include an oral device-fashion by dentist  Surgical options can be an option of treatment as well

## 2023-05-25 ENCOUNTER — Telehealth: Payer: Self-pay | Admitting: Pulmonary Disease

## 2023-05-25 DIAGNOSIS — G4733 Obstructive sleep apnea (adult) (pediatric): Secondary | ICD-10-CM

## 2023-05-25 NOTE — Telephone Encounter (Signed)
Carney Bern checking on fax sent for CPAP supplies. Fax sent 03/27/2023. Carney Bern phone number is [phone].

## 2023-05-26 ENCOUNTER — Ambulatory Visit: Payer: 59 | Attending: Internal Medicine | Admitting: Internal Medicine

## 2023-05-26 ENCOUNTER — Encounter: Payer: Self-pay | Admitting: Internal Medicine

## 2023-05-26 VITALS — BP 144/74 | HR 76 | Ht 65.0 in | Wt 234.0 lb

## 2023-05-26 DIAGNOSIS — Z8249 Family history of ischemic heart disease and other diseases of the circulatory system: Secondary | ICD-10-CM | POA: Diagnosis not present

## 2023-05-26 DIAGNOSIS — E785 Hyperlipidemia, unspecified: Secondary | ICD-10-CM | POA: Diagnosis not present

## 2023-05-26 DIAGNOSIS — I10 Essential (primary) hypertension: Secondary | ICD-10-CM | POA: Diagnosis not present

## 2023-05-26 DIAGNOSIS — G4733 Obstructive sleep apnea (adult) (pediatric): Secondary | ICD-10-CM | POA: Diagnosis not present

## 2023-05-26 NOTE — Patient Instructions (Signed)
Medication Instructions:  Your physician recommends that you continue on your current medications as directed. Please refer to the Current Medication list given to you today.  *If you need a refill on your cardiac medications before your next appointment, please call your pharmacy*   Testing/Procedures: Your physician has requested that you have an echocardiogram. Echocardiography is a painless test that uses sound waves to create images of your heart. It provides your doctor with information about the size and shape of your heart and how well your heart's chambers and valves are working. This procedure takes approximately one hour. There are no restrictions for this procedure. Please do NOT wear cologne, perfume, aftershave, or lotions (deodorant is allowed). Please arrive 15 minutes prior to your appointment time. This will take place at [address] [place]    Your physician has requested that you have a renal artery duplex. During this test, an ultrasound is used to evaluate blood flow to the kidneys. Take your medications as you usually do. This will take place at [address] [facility], [address].  No food after 11PM the night before.  Water is OK. (Don't drink liquids if you have been instructed not to for ANOTHER test). Avoid foods that produce bowel gas, for 24 hours prior to exam (see below). No breakfast, no chewing gum, no smoking or carbonated beverages. Patient may take morning medications with water. Come in for test at least 15 minutes early to register.    Follow-Up: At [facility], you and your health needs are our priority.  As part of our continuing mission to provide you with exceptional heart care, we have created designated Provider Care Teams.  These Care Teams include your primary Cardiologist (physician) and Advanced Practice Providers (APPs -  Physician Assistants and Nurse Practitioners) who all work together to provide you with the care you need, when  you need it.  We recommend signing up for the patient portal called "MyChart".  Sign up information is provided on this After Visit Summary.  MyChart is used to connect with patients for Virtual Visits (Telemedicine).  Patients are able to view lab/test results, encounter notes, upcoming appointments, etc.  Non-urgent messages can be sent to your provider as well.   To learn more about what you can do with MyChart, go to [email or web address].    Your next appointment:   6 month(s)  Provider:   Weston Brass, MD

## 2023-05-26 NOTE — Progress Notes (Signed)
Cardiology Office Note:  .   Date:  05/27/2023  ID:  Aimee [name], DOB 1961-01-23, MRN [id number] PCP: [name], PA  [facility] Providers Cardiologist:  None    History of Present Illness: .   Aimee [name] is a 62 y.o. female.  Discussed the use of AI scribe software for clinical note transcription with the patient, who gave verbal consent to proceed.  History of Present Illness   The patient, with a history of GERD, hypertension, obesity, anxiety, depression, and mild obstructive sleep apnea, presents for a follow-up visit. She recently underwent a total knee arthroplasty. The patient reports that her blood pressure has been fluctuating but has been better controlled recently. She has been monitoring her blood pressure at home, with readings around 127/74 to 130/80. The patient notes that her blood pressure seems to be influenced by stress and pain, as it has improved since she has been off work and since her knee pain has decreased post-surgery.  The patient expresses concern about her family history of congestive heart failure. She reports that many female relatives on her father's side, including her father and nine uncles, died of congestive heart failure. Additionally, siblings also died of congestive heart failure, and another brother is currently dealing with heart issues. The patient does not smoke or drink, and she engages in regular exercise, including water aerobics. She is due to return to work soon but plans to retire in a few months.        ROS: negative except per HPI above.  Studies Reviewed: .        Results   LABS LDL: 91 Hemoglobin: low  DIAGNOSTIC Echocardiogram: Normal ejection fraction, grade one diastolic dysfunction, moderately dilated left atrium (2023) Calcium score: 0     Risk Assessment/Calculations:      Physical Exam:   VS:  BP (!) 144/74   Pulse 76   Ht 5\' 5"  (1.651 m)   Wt 234 lb (106.1 kg)   SpO2 98%   BMI 38.94 kg/m     Wt Readings from Last 3 Encounters:  05/26/23 234 lb (106.1 kg)  04/18/23 229 lb (103.9 kg)  03/20/23 237 lb (107.5 kg)     Physical Exam   VITALS: BP- 127/74 CARDIOVASCULAR: Very faint 1 out of 6 systolic murmur.     GEN: Well nourished, well developed in no acute distress NECK: No JVD; No carotid bruits RESPIRATORY:  Clear to auscultation without rales, wheezing or rhonchi  ABDOMEN: Soft, non-tender, non-distended EXTREMITIES:  No edema; No deformity   ASSESSMENT AND PLAN: .    1. Primary hypertension   2. OSA on CPAP   3. Dyslipidemia, goal LDL below 70   4. Family history of early CAD     Assessment and Plan    Hypertension Well controlled on Amlodipine 10mg  daily and Hydrochlorothiazide 25mg  daily. Recent blood pressure readings at home have been within normal range. Noted a possible correlation between stress, pain, and elevated blood pressure. -Continue current medications. -Order renal ultrasound to rule out renal artery stenosis as a cause for hypertension. -Check blood pressure regularly, especially upon return to work.  Family history of Congestive Heart Failure Noted significant family history of congestive heart failure. No personal history of heart failure. Last echocardiogram showed normal ejection fraction, grade 1 diastolic dysfunction, and moderately dilated left atrium. -Order repeat echocardiogram to monitor for any changes. -Consider genetic testing if any concerning changes are noted on echocardiogram.  Hyperlipidemia LDL 91,  managed with lifestyle modifications due to a calcium score of zero. -Continue lifestyle modifications.  Anemia History of anemia, currently taking iron supplements. -Continue iron supplementation. - likely cause of flow murmur  Follow-up in 6 months. Notify the office if there are any significant changes in blood pressure, especially upon return to work.                Total time of encounter: 30 minutes total time  of encounter, including 20 minutes spent in face-to-face patient care on the date of this encounter. This time includes coordination of care and counseling regarding above mentioned problem list. Remainder of non-face-to-face time involved reviewing chart documents/testing relevant to the patient encounter and documentation in the medical record. I have independently reviewed documentation from referring provider.   Aimee Brass, MD, [facility]  [facility]

## 2023-05-26 NOTE — Telephone Encounter (Signed)
Routing to both Lyman and Dr. Val Eagle to check to see if a fax has been received for supplies.

## 2023-05-29 ENCOUNTER — Ambulatory Visit (HOSPITAL_COMMUNITY): Payer: 59 | Attending: Internal Medicine

## 2023-05-29 DIAGNOSIS — I10 Essential (primary) hypertension: Secondary | ICD-10-CM | POA: Diagnosis present

## 2023-05-29 LAB — ECHOCARDIOGRAM COMPLETE
Area-P 1/2: 4.13 cm2
S' Lateral: 3.1 cm

## 2023-05-30 NOTE — Telephone Encounter (Signed)
Please send in request for CPAP supplies if still needed

## 2023-05-31 NOTE — Telephone Encounter (Signed)
Order placed

## 2023-06-02 ENCOUNTER — Telehealth: Payer: Self-pay | Admitting: Pulmonary Disease

## 2023-06-02 DIAGNOSIS — G4733 Obstructive sleep apnea (adult) (pediatric): Secondary | ICD-10-CM

## 2023-06-02 NOTE — Telephone Encounter (Signed)
Hello Aimee [name],   Order has been received, After review of the notes in the account we will need a OV f4f note showing usage and benefit to process this request. Per the last OV note patient not using pap unit , okay'ed to return due to non-usage.  Patient has been non-compliant and has even called in to return pap unit.  We will need to show at least 30 days current compliance and benefit to process to insurance. patient is only showing 36% compliance and has not used the pap unit since 11/30/22.  please advise.   Thank you,   Luellen Pucker

## 2023-06-03 NOTE — Telephone Encounter (Signed)
Routing to Dr. Val Eagle for him to review.

## 2023-06-13 ENCOUNTER — Ambulatory Visit (HOSPITAL_COMMUNITY)
Admission: RE | Admit: 2023-06-13 | Discharge: 2023-06-13 | Disposition: A | Discharge status: Home / Self Care | Payer: 59 | Source: Ambulatory Visit | Attending: Internal Medicine | Admitting: Internal Medicine

## 2023-06-13 DIAGNOSIS — I10 Essential (primary) hypertension: Secondary | ICD-10-CM | POA: Insufficient documentation

## 2023-06-25 NOTE — Telephone Encounter (Signed)
CPAP may be discontinued per patient request as noted in last office visit.  Schedule for follow-up appointment in office to discuss further if anything else needed

## 2023-06-27 NOTE — Telephone Encounter (Signed)
Referral was placed to d/c CPAP

## 2023-08-01 ENCOUNTER — Other Ambulatory Visit: Payer: Self-pay | Admitting: Internal Medicine

## 2023-08-01 MED ORDER — AMLODIPINE BESYLATE 10 MG PO TABS: 10.0000 mg | ORAL_TABLET | Freq: Every day | ORAL | 3 refills | Status: DC

## 2023-08-29 ENCOUNTER — Encounter: Payer: 59 | Admitting: Genetic Counselor

## 2023-09-12 ENCOUNTER — Ambulatory Visit: Payer: 59 | Attending: Genetic Counselor | Admitting: Genetic Counselor

## 2023-09-19 NOTE — Progress Notes (Signed)
Genetic Consult  Referral Reason  Aimee [name] was referred for genetic consult of NICM stemming from a dramatic family history of CHF.   Personal Medical Information Aimee [name] (II.7 on pedigree) is a 63 y.o [place] lady who is undergoing treatment for HTN. A recent echocardiogram did not detect any structural or functional abnormalities. She denies having any symptoms related to CHF.  Traditional Risk Factors [name] denies having other cardiac or systemic conditions that can cause non-ischemic cardiomyopathy, namely, myocarditis, ischemic heart disease, infiltrative myocardial disease (amyloidosis, sarcoidosis, hemochromatosis), infection with HIV virus, HTN, connective tissue disease (such as systemic lupus erythematosus etc.), doxorubicin therapy, alcohol and drug abuse.  Family history  Relation to Proband Pedigree # Current age Heart condition/age of onset Notes  Brothers, 4 II.1, II.2, II.5, II.6 77, 70, 62, deceased II.5-CHF @ 18 II.6- CAD II.5- Died of CHF @ 65, R-OH and drug abuse  Sisters, 3 II.3, II.4, II.8 Deceased, 77, 1 ? II.3- died of colon cancer/CHF @ 53 II.4- cancer and CHF        Father I.12 Deceased  Died of CHF @ 53, R-OH and drug abuse  Paternal uncles, 34 I.3-I.11 Deceased  Died of CHF @ ?, R-OH and drug abuse  Paternal aunt I.1-I.2 Deceased None Died of old age   Impression  In summary, Aimee [name] is asymptomatic and does not exhibit clinical features of CHF. However, there is significant family history of CHF amongst her father and all paternal uncles as well as one brother. She reports that all these relatives had a long history of alcohol and drug abuse that can present as CHF.    She is very concerned that she is at risk for CHF based on her family history. Explained to her that at this time genetic testing her for CHF/NICM is inappropriate as she does not have clinical signs and symptoms of CHF. Additionally, she understands that lifestyle choices of alcohol  and drug abuse is a risk factor for CHF as evidenced in her paternal relatives and brother. If the family is interested in pursuing genetic testing, then the family member that is most severely affected with CHF in the absence of the risk factors detailed above will be appropriate to undergo genetic testing. She verbalized understanding.    Sidney Ace, Ph.D, [facility] Clinical Molecular Geneticist

## 2023-10-17 ENCOUNTER — Ambulatory Visit (INDEPENDENT_AMBULATORY_CARE_PROVIDER_SITE_OTHER): Payer: 59

## 2023-10-17 DIAGNOSIS — Z111 Encounter for screening for respiratory tuberculosis: Secondary | ICD-10-CM | POA: Diagnosis not present

## 2023-10-17 NOTE — Progress Notes (Addendum)
.  Patient is in office today for a nurse visit for PPD. Patient Injection was given in the  Left arm. Patient tolerated injection well.   Left Forearm admin site.   Pt was advised to come back in 48/72 hours to have site read. Pt understood. I have the form to be completed for Pt.  Aimee [name]

## 2023-10-19 ENCOUNTER — Ambulatory Visit: Payer: 59

## 2023-10-19 LAB — TB SKIN TEST
Induration: 0 mm
TB Skin Test: NEGATIVE

## 2023-10-23 LAB — HM MAMMOGRAPHY

## 2023-11-30 ENCOUNTER — Ambulatory Visit: Payer: 59 | Admitting: Physician Assistant

## 2023-11-30 ENCOUNTER — Encounter: Payer: Self-pay | Admitting: Physician Assistant

## 2023-11-30 VITALS — BP 140/90 | HR 84 | Temp 98.4°F | Ht 65.25 in | Wt 261.0 lb

## 2023-11-30 DIAGNOSIS — E559 Vitamin D deficiency, unspecified: Secondary | ICD-10-CM

## 2023-11-30 DIAGNOSIS — T7840XD Allergy, unspecified, subsequent encounter: Secondary | ICD-10-CM

## 2023-11-30 DIAGNOSIS — Z0000 Encounter for general adult medical examination without abnormal findings: Secondary | ICD-10-CM

## 2023-11-30 DIAGNOSIS — I10 Essential (primary) hypertension: Secondary | ICD-10-CM

## 2023-11-30 DIAGNOSIS — R710 Precipitous drop in hematocrit: Secondary | ICD-10-CM

## 2023-11-30 DIAGNOSIS — Z79899 Other long term (current) drug therapy: Secondary | ICD-10-CM | POA: Diagnosis not present

## 2023-11-30 DIAGNOSIS — J302 Other seasonal allergic rhinitis: Secondary | ICD-10-CM

## 2023-11-30 DIAGNOSIS — G4733 Obstructive sleep apnea (adult) (pediatric): Secondary | ICD-10-CM

## 2023-11-30 DIAGNOSIS — E88819 Insulin resistance, unspecified: Secondary | ICD-10-CM

## 2023-11-30 DIAGNOSIS — F39 Unspecified mood [affective] disorder: Secondary | ICD-10-CM

## 2023-11-30 DIAGNOSIS — E785 Hyperlipidemia, unspecified: Secondary | ICD-10-CM | POA: Insufficient documentation

## 2023-11-30 LAB — COMPREHENSIVE METABOLIC PANEL WITH GFR
ALT: 18 U/L (ref 0–35)
AST: 20 U/L (ref 0–37)
Albumin: 4.4 g/dL (ref 3.5–5.2)
Alkaline Phosphatase: 68 U/L (ref 39–117)
BUN: 20 mg/dL (ref 6–23)
CO2: 32 meq/L (ref 19–32)
Calcium: 9.3 mg/dL (ref 8.4–10.5)
Chloride: 99 meq/L (ref 96–112)
Creatinine, Ser: 0.82 mg/dL (ref 0.40–1.20)
GFR: 77.18 mL/min (ref >60.00)
Glucose, Bld: 96 mg/dL (ref 70–99)
Potassium: 3.6 meq/L (ref 3.5–5.1)
Sodium: 140 meq/L (ref 135–145)
Total Bilirubin: 0.4 mg/dL (ref 0.2–1.2)
Total Protein: 7.9 g/dL (ref 6.0–8.3)

## 2023-11-30 LAB — IBC + FERRITIN
Ferritin: 44 ng/mL (ref 10.0–291.0)
Iron: 99 ug/dL (ref 42–145)
Saturation Ratios: 27.5 % (ref 20.0–50.0)
TIBC: 359.8 ug/dL (ref 250.0–450.0)
Transferrin: 257 mg/dL (ref 212.0–360.0)

## 2023-11-30 LAB — CBC WITH DIFFERENTIAL/PLATELET
Basophils Absolute: 0 10*3/uL (ref 0.0–0.1)
Basophils Relative: 0.4 % (ref 0.0–3.0)
Eosinophils Absolute: 0.1 10*3/uL (ref 0.0–0.7)
Eosinophils Relative: 2.2 % (ref 0.0–5.0)
HCT: 37.4 % (ref 36.0–46.0)
Hemoglobin: 12 g/dL (ref 12.0–15.0)
Lymphocytes Relative: 32.4 % (ref 12.0–46.0)
Lymphs Abs: 1.7 10*3/uL (ref 0.7–4.0)
MCHC: 32 g/dL (ref 30.0–36.0)
MCV: 81.1 fl (ref 78.0–100.0)
Monocytes Absolute: 0.5 10*3/uL (ref 0.1–1.0)
Monocytes Relative: 9 % (ref 3.0–12.0)
Neutro Abs: 2.9 10*3/uL (ref 1.4–7.7)
Neutrophils Relative %: 56 % (ref 43.0–77.0)
Platelets: 348 10*3/uL (ref 150.0–400.0)
RBC: 4.61 Mil/uL (ref 3.87–5.11)
RDW: 15.4 % (ref 11.5–15.5)
WBC: 5.1 10*3/uL (ref 4.0–10.5)

## 2023-11-30 LAB — VITAMIN D 25 HYDROXY (VIT D DEFICIENCY, FRACTURES): VITD: 32.99 ng/mL (ref 30.00–100.00)

## 2023-11-30 LAB — LIPID PANEL
Cholesterol: 171 mg/dL (ref 0–200)
HDL: 57.5 mg/dL (ref >39.00)
LDL Cholesterol: 100 mg/dL — ABNORMAL HIGH (ref 0–99)
NonHDL: 113.27
Total CHOL/HDL Ratio: 3
Triglycerides: 68 mg/dL (ref 0.0–149.0)
VLDL: 13.6 mg/dL (ref 0.0–40.0)

## 2023-11-30 MED ORDER — AMLODIPINE BESYLATE 10 MG PO TABS: 10.0000 mg | ORAL_TABLET | Freq: Every day | ORAL | 3 refills | Status: AC

## 2023-11-30 MED ORDER — MONTELUKAST SODIUM 10 MG PO TABS: ORAL_TABLET | ORAL | 3 refills | Status: AC

## 2023-11-30 MED ORDER — HYDROCHLOROTHIAZIDE 25 MG PO TABS
25.0000 mg | ORAL_TABLET | Freq: Every day | ORAL | 3 refills | Status: AC
Start: 2023-11-30 — End: 2024-11-29

## 2023-11-30 NOTE — Patient Instructions (Addendum)
 It was great to see you!  Your blood pressure is elevated in our office today.  I recommend that you monitor this at home.  Your goal blood pressure should be around < 130/80, unless you are over 62 years old, your goal may be closer to 140-150/90. Please note if you have been given other goals from a cardiologist or other healthcare provider, please defer to their recommendations.  When preparing to take your blood pressure: Plan ahead. Don't smoke, drink caffeine or exercise within 30 minutes before taking your blood pressure. Empty your bladder. Don't take the measurement over clothes. Remove the clothing over the arm that will be used to measure blood pressure. You can use either arm unless otherwise told by a healthcare provider. Usually there is not a big difference between readings on them. Be still. Allow at least five minutes of quiet rest before measurements. Don't talk or use the phone. Sit correctly. Sit with your back straight and supported (on a dining chair, rather than a sofa). Your feet should be flat on the floor. Do not cross your legs. Support your arm on a flat surface. The middle of the cuff should be placed on the upper arm at heart level.  Measure at the same time of the day. Take multiple readings and record the results. Each time you measure, take two readings one minute apart. Record the results and bring in to your next office visit.  In order to know how well the medication is working, I would like you to take your readings 1-2 hours after taking your blood pressure medication if possible. Take your blood pressure measurements and record 2-3 days per week.  If you get a high blood pressure reading: A single high reading is not an immediate cause for alarm. If you get a reading that is higher than normal, take your blood pressure a second time. Write down the results of both measurements. Check with your health care professional to see if there's a health concern or  whether there may be problems with your monitor. If your blood pressure readings are suddenly higher than 180/120 mm Hg, wait at least one minute and test again. If your readings are still very high, contact your health care professional immediately. You could be having a hypertensive crisis. Call 911 if your blood pressure is higher than 180/120 mm Hg and if you are having new signs or symptoms that may include: Chest pain Shortness of breath Back pain Numbness Weakness Change in vision Difficulty speaking Confusion Dizziness Vomiting     Please make sure you call and schedule your colonoscopy  Please go to the lab for blood work.   Our office will call you with your results unless you have chosen to receive results via MyChart.  If your blood work is normal we will follow-up each year for physicals and as scheduled for chronic medical problems.  If anything is abnormal we will treat accordingly and get you in for a follow-up.  Take care,  Aimee [name]

## 2023-11-30 NOTE — Progress Notes (Signed)
 Subjective:    Aimee [name] is a 63 y.o. female and is here for a comprehensive physical exam.  HPI  Health Maintenance Due  Topic Date Due   MAMMOGRAM  09/13/2023   Colonoscopy  03/25/2024    Acute Concerns: None.  Chronic Issues: Hypertension Pt is on Amlodipine 10 mg once daily and HCTZ 25 mg once daily.  Good compliance and tolerance reported. Monitors her blood pressures at home, stating it occasionally fluctuates depending on the time of day she checks it.  She adds her BP has improved since retiring in January. On average her readings are in the 120s/70s; reports a reading of 127/70.  Does not take Ibuprofen/NSAIDs for her knee pain.   S/p total knee replacement / Knee pain Pt had a left total knee replacement in 03/2023.  Overall she is healing very well.  She does endorse right knee pain; plans to wait some time before getting her right knee replacement.  Has not been taking Ibuprofen to manage her knee pain.   Anemia Pt is taking iron supplements daily.  She reports her latest Hgb was below goal -- this was done at gynecology  Pt not having periods; had hysterectomy in 2004.   Health Maintenance: Immunizations -- UTD.  Colonoscopy -- Due soon, last done 03/26/2019. Non-bleeding internal hemorrhoids found. Fmhx of colon neoplasia. Repeat 5 years recommended.  Mammogram -- Overdue, last done 09/12/2022. No mammographic evidence of malignancy.  PAP -- Hysterectomy 2004.  Bone Density -- N/A Diet -- Overall healthy.  Exercise -- Water aerobics and biking. Has been trying to exercise, reports limitations due to her right knee pain.   Sleep habits -- No concerns.  Mood -- Stable.   UTD with dentist? - Yes. UTD with eye doctor? - Yes.   Weight history: Wt Readings from Last 10 Encounters:  11/30/23 261 lb (118.4 kg)  05/26/23 234 lb (106.1 kg)  04/18/23 229 lb (103.9 kg)  03/20/23 237 lb (107.5 kg)  03/09/23 237 lb (107.5 kg)  01/26/23 234 lb (106.1 kg)   01/18/23 236 lb 9.6 oz (107.3 kg)  01/12/23 233 lb (105.7 kg)  01/03/23 238 lb (108 kg)  12/28/22 237 lb (107.5 kg)   Body mass index is 43.1 kg/m. No LMP recorded. Patient has had a hysterectomy.  Alcohol use:  reports no history of alcohol use.  Tobacco use:  Tobacco Use: Low Risk  (11/30/2023)   Patient History    Smoking Tobacco Use: Never    Smokeless Tobacco Use: Never    Passive Exposure: Not on file   Eligible for lung cancer screening? no     11/29/2022    8:51 AM  Depression screen PHQ 2/9  Decreased Interest 0  Down, Depressed, Hopeless 1  PHQ - 2 Score 1  Altered sleeping 1  Tired, decreased energy 0  Change in appetite 0  Feeling bad or failure about yourself  0  Trouble concentrating 0  Moving slowly or fidgety/restless 0  Suicidal thoughts 0  PHQ-9 Score 2  Difficult doing work/chores Not difficult at all     Other providers/specialists: Patient Care Team: [name], [place] as PCP - General (Physician Assistant) Parke Poisson, MD as Consulting Physician (Cardiology)    PMHx, SurgHx, SocialHx, Medications, and Allergies were reviewed in the Visit Navigator and updated as appropriate.   Past Medical History:  Diagnosis Date   Allergy    Anxiety    Arthritis    Depression  Family history of breast cancer    Family history of colon cancer    Family history of pancreatic cancer    GERD (gastroesophageal reflux disease)    Heart murmur    per PCP   Hyperlipidemia 11/30/2023   No rx   Hypertension    Joint pain    Obstructive sleep apnea 10/06/2022   Rheumatoid arthritis [facility])      Past Surgical History:  Procedure Laterality Date   ABDOMINAL HYSTERECTOMY  2004   arm surgery Left    CESAREAN SECTION  2003   KNEE ARTHROSCOPY Right 2013   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   Procedure: TOTAL KNEE ARTHROPLASTY;  Surgeon: Liliane Rei, MD;  Location: [facility];  Service: Orthopedics;  Laterality: Left;     Family History   Problem Relation Age of Onset   Colon cancer Mother 21   Cancer Mother    Diabetes Mother    High blood pressure Mother    Obesity Mother    Heart disease Father        CHF   Hypertension Father    Heart attack Father 28   Stroke Father    Alcoholism Father    Diabetes Sister    Breast cancer Sister 70   Congestive Heart Failure Sister    Heart attack Sister    Pancreatic cancer Sister 49   Heart failure Brother    Heart disease Brother        stent   Heart Problems Paternal Uncle    Colon cancer Maternal Grandfather        dx >50    Social History   Tobacco Use   Smoking status: Never   Smokeless tobacco: Never  Vaping Use   Vaping status: Never Used  Substance Use Topics   Alcohol use: No   Drug use: No    Review of Systems:   Review of Systems  Constitutional:  Negative for chills, fever, malaise/fatigue and weight loss.  HENT:  Negative for hearing loss, sinus pain and sore throat.   Respiratory:  Negative for cough and hemoptysis.   Cardiovascular:  Negative for chest pain, palpitations, leg swelling and PND.  Gastrointestinal:  Negative for abdominal pain, constipation, diarrhea, heartburn, nausea and vomiting.  Genitourinary:  Negative for dysuria, frequency and urgency.  Musculoskeletal:  Negative for back pain, myalgias and neck pain.  Skin:  Negative for itching and rash.  Neurological:  Negative for dizziness, tingling, seizures and headaches.  Endo/Heme/Allergies:  Negative for polydipsia.  Psychiatric/Behavioral:  Negative for depression. The patient is not nervous/anxious.       Objective:   BP (!) 140/90 (BP Location: Left Arm, Patient Position: Sitting, Cuff Size: Large)   Pulse 84   Temp 98.4 F (36.9 C) (Temporal)   Ht 5' 5.25" (1.657 m)   Wt 261 lb (118.4 kg)   SpO2 96%   BMI 43.10 kg/m  Body mass index is 43.1 kg/m.   General Appearance:    Alert, cooperative, no distress, appears stated age  Head:    Normocephalic, without  obvious abnormality, atraumatic  Eyes:    PERRL, conjunctiva/corneas clear, EOM's intact, fundi    benign, both eyes  Ears:    Normal TM's and external ear canals, both ears  Nose:   Nares normal, septum midline, mucosa normal, no drainage    or sinus tenderness  Throat:   Lips, mucosa, and tongue normal; teeth and gums normal  Neck:   Supple, symmetrical, trachea midline,  no adenopathy;    thyroid:  no enlargement/tenderness/nodules; no carotid   bruit or JVD  Back:     Symmetric, no curvature, ROM normal, no CVA tenderness  Lungs:     Clear to auscultation bilaterally, respirations unlabored  Chest Wall:    No tenderness or deformity   Heart:    Regular rate and rhythm, S1 and S2 normal, no murmur, rub or gallop  Breast Exam:    Deferred  Abdomen:     Soft, non-tender, bowel sounds active all four quadrants,    no masses, no organomegaly  Genitalia:    Deferred  Extremities:   Extremities normal, atraumatic, no cyanosis or edema  Pulses:   2+ and symmetric all extremities  Skin:   Skin color, texture, turgor normal, no rashes or lesions  Lymph nodes:   Cervical, supraclavicular, and axillary nodes normal  Neurologic:   CNII-XII intact, normal strength, sensation and reflexes    throughout    Assessment/Plan:   Routine physical examination; Medication management Today patient counseled on age appropriate routine health concerns for screening and prevention, each reviewed and up to date or declined. Immunizations reviewed and up to date or declined. Labs ordered and reviewed. Risk factors for depression reviewed and negative. Hearing function and visual acuity are intact. ADLs screened and addressed as needed. Functional ability and level of safety reviewed and appropriate. Education, counseling and referrals performed based on assessed risks today. Patient provided with a copy of personalized plan for preventive services.  Primary hypertension Above goal today No evidence of  end-organ damage on my exam Recommend patient monitor home blood pressure at least a few times weekly Continue amlodipine 10 mg daily and hydrochloroTHIAZIDE 25 mg daily If home monitoring shows consistent elevation, or any symptom(s) develop, recommend reach out to us  for further advice on next steps  Mood disorder (HCC)- emotional eating Sees psychiatry Considering a GLP-1 with gynecology   OSA on CPAP Very mild Does not meet criteria for obstructive sleep apnea evaluation  Insulin resistance Update Hemoglobin A1c and provide recommendations   Decreased hemoglobin Update blood work and provide recommendations Discussed need for screening colonoscopy soon I did discuss that if she has worsening hemoglobin/ferritin -- may need to get colonoscopy done sooner  Vitamin D deficiency Update vitamin D   Hyperlipidemia, unspecified hyperlipidemia type Update lipid panel and provide recommendations   Allergy Well controlled Refill singulair 10 mg daily  I, Bernita Bristle, acting as a Neurosurgeon for [employer], [place]., have documented all relevant documentation on the behalf of Alexander Iba, [place], as directed by  Alexander Iba, PA while in the presence of Alexander Iba, [place].  I, Bernita Bristle, have reviewed all documentation for this visit. The documentation on 11/30/23 for the exam, diagnosis, procedures, and orders are all accurate and complete.  Alexander Iba, PA-C [facility] [place] [facility]

## 2023-12-01 ENCOUNTER — Encounter: Payer: Self-pay | Admitting: Physician Assistant

## 2024-02-22 LAB — HM COLONOSCOPY

## 2024-02-29 ENCOUNTER — Ambulatory Visit: Admitting: Physician Assistant

## 2024-02-29 ENCOUNTER — Other Ambulatory Visit: Payer: Self-pay | Admitting: Physician Assistant

## 2024-02-29 ENCOUNTER — Encounter: Payer: Self-pay | Admitting: Physician Assistant

## 2024-02-29 VITALS — BP 130/78 | HR 80 | Temp 98.2°F | Ht 65.25 in | Wt 267.4 lb

## 2024-02-29 DIAGNOSIS — N644 Mastodynia: Secondary | ICD-10-CM

## 2024-02-29 DIAGNOSIS — R229 Localized swelling, mass and lump, unspecified: Secondary | ICD-10-CM | POA: Diagnosis not present

## 2024-02-29 NOTE — Progress Notes (Signed)
 Aimee [name] is a 63 y.o. female here for a new problem.  History of Present Illness:   Chief Complaint  Patient presents with   Mass    Pt noticed mass top of left chest area yesterday. Denies pain.    HPI  Mass Pt complains of mass at the top of her left chest area starting yesterday.  She reports she noticed this mass last night while getting dressed. The mass is 17 cm by 12 cm. She does not feel anything of concern in her axilla. 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 Endorses tenderness upon palpation. Denies pain, recent trauma, or recent vaccinations.  Past Medical History:  Diagnosis Date   Allergy    Anxiety    Arthritis    Depression    Family history of breast cancer    Family history of colon cancer    Family history of pancreatic cancer    GERD (gastroesophageal reflux disease)    Heart murmur    per PCP   Hyperlipidemia 11/30/2023   No rx   Hypertension    Joint pain    Obstructive sleep apnea 10/06/2022   Rheumatoid arthritis (HCC)      Social History   Tobacco Use   Smoking status: Never   Smokeless tobacco: Never  Vaping Use   Vaping status: Never Used  Substance Use Topics   Alcohol use: No   Drug use: No    Past Surgical History:  Procedure Laterality Date   ABDOMINAL HYSTERECTOMY  2004   arm surgery Left    CESAREAN SECTION  2003   KNEE ARTHROSCOPY Right 2013   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   Procedure: TOTAL KNEE ARTHROPLASTY;  Surgeon: Melodi Lerner, MD;  Location: [facility];  Service: Orthopedics;  Laterality: Left;    Family History  Problem Relation Age of Onset   Colon cancer Mother 84   Cancer Mother    Diabetes Mother    High blood pressure Mother    Obesity Mother    Heart disease Father        CHF   Hypertension Father    Heart attack Father 16   Stroke Father    Alcoholism Father    Diabetes Sister    Breast cancer Sister 23   Congestive  Heart Failure Sister    Heart attack Sister    Pancreatic cancer Sister 19   Heart failure Brother    Heart disease Brother        stent   Heart Problems Paternal Uncle    Colon cancer Maternal Grandfather        dx >50    No Known Allergies  Current Medications:   Current Outpatient Medications:    albuterol  (VENTOLIN  HFA) 108 (90 Base) MCG/ACT inhaler, INHALE 2 PUFFS INTO THE LUNGS EVERY 6 HOURS AS NEEDED FOR WHEEZING OR SHORTNESS OF BREATH, Disp: 6.7 g, Rfl: 2   ALPRAZolam  (XANAX ) 0.5 MG tablet, Take 0.5 mg by mouth 4 (four) times daily as needed for anxiety., Disp: , Rfl:    amLODipine  (NORVASC ) 10 MG tablet, Take 1 tablet (10 mg total) by mouth daily., Disp: 90 tablet, Rfl: 3   beclomethasone (QVAR  REDIHALER) 40 MCG/ACT inhaler, Inhale 1 puff into the lungs 2 (two) times daily., Disp: 1 each, Rfl: 1   Calcium Polycarbophil (FIBER-CAPS PO), Take 2 capsules by mouth daily in the afternoon., Disp: , Rfl:  Cholecalciferol (VITAMIN D3) 25 MCG (1000 UT) capsule, Take 1 capsule (1,000 Units total) by mouth daily., Disp: , Rfl:    clotrimazole-betamethasone (LOTRISONE) cream, Apply 1 Application topically 2 (two) times daily., Disp: , Rfl:    ferrous sulfate  325 (65 FE) MG tablet, Take 325 mg by mouth daily., Disp: , Rfl:    FLUoxetine  (PROZAC ) 40 MG capsule, Take 40 mg by mouth daily., Disp: , Rfl:    hydrochlorothiazide  (HYDRODIURIL ) 25 MG tablet, Take 1 tablet (25 mg total) by mouth daily., Disp: 90 tablet, Rfl: 3   montelukast  (SINGULAIR ) 10 MG tablet, TAKE 1 TABLET(10 MG) BY MOUTH AT BEDTIME, Disp: 90 tablet, Rfl: 3   Multiple Vitamin (MULTIVITAMIN) tablet, Take 1 tablet by mouth daily., Disp: , Rfl:    Multiple Vitamin (MULTIVITAMIN) tablet, Take 1 tablet by mouth daily., Disp: , Rfl:    omeprazole (PRILOSEC) 20 MG capsule, Take 20 mg by mouth daily., Disp: , Rfl:    triamcinolone  (KENALOG ) 0.1 %, Apply 1 Application topically daily., Disp: , Rfl:    Review of Systems:    Negative unless otherwise specified per HPI.  Vitals:   Vitals:   02/29/24 1021  BP: 130/78  Pulse: 80  Temp: 98.2 F (36.8 C)  TempSrc: Temporal  SpO2: 98%  Weight: 267 lb 6.1 oz (121.3 kg)  Height: 5' 5.25 (1.657 m)     Body mass index is 44.15 kg/m.  Physical Exam:   Physical Exam Constitutional:      Appearance: Normal appearance. She is well-developed.  HENT:     Head: Normocephalic and atraumatic.  Eyes:     General: Lids are normal.     Extraocular Movements: Extraocular movements intact.     Conjunctiva/sclera: Conjunctivae normal.  Pulmonary:     Effort: Pulmonary effort is normal.  Chest:  Breasts:    Left: Mass (tender, behind nipple) present.    Musculoskeletal:        General: Normal range of motion.     Cervical back: Normal range of motion and neck supple.  Skin:    General: Skin is warm and dry.  Neurological:     Mental Status: She is alert and oriented to person, place, and time.  Psychiatric:        Attention and Perception: Attention and perception normal.        Mood and Affect: Mood normal.        Behavior: Behavior normal.        Thought Content: Thought content normal.        Judgment: Judgment normal.     Assessment and Plan:   Breast pain, left (Primary); Localized skin mass, lump, or swelling - US  LIMITED ULTRASOUND INCLUDING AXILLA LEFT BREAST ; Future - MM 3D DIAGNOSTIC MAMMOGRAM UNILATERAL LEFT BREAST; Future  Unclear etiology Stat referral to breast center for ultrasound and diagnostic mammogram Follow up with us  based on results    I, Lavern Simmers, acting as a Neurosurgeon for [employer], [place]., have documented all relevant documentation on the behalf of Lucie Buttner, [place], as directed by Lucie Buttner, PA while in the presence of Lucie Buttner, [place].  I, Lucie Buttner, [place], have reviewed all documentation for this visit. The documentation on 02/29/24 for the exam, diagnosis, procedures, and orders are all accurate  and complete.  Lucie Buttner, PA-C

## 2024-02-29 NOTE — Patient Instructions (Signed)
 It was great to see you!  We are going to get a stat ultrasound and mammogram The latest will be on Monday  Someone will be in touch to schedule this  Take care,  Bond Grieshop PA-C

## 2024-03-01 ENCOUNTER — Emergency Department (HOSPITAL_COMMUNITY)
Admission: EM | Admit: 2024-03-01 | Discharge: 2024-03-01 | Disposition: A | Discharge status: Home / Self Care | Attending: Emergency Medicine | Admitting: Emergency Medicine

## 2024-03-01 ENCOUNTER — Ambulatory Visit
Admission: RE | Admit: 2024-03-01 | Discharge: 2024-03-01 | Disposition: A | Discharge status: Home / Self Care | Source: Ambulatory Visit | Attending: Physician Assistant | Admitting: Physician Assistant

## 2024-03-01 ENCOUNTER — Emergency Department (HOSPITAL_COMMUNITY)

## 2024-03-01 DIAGNOSIS — D171 Benign lipomatous neoplasm of skin and subcutaneous tissue of trunk: Secondary | ICD-10-CM | POA: Insufficient documentation

## 2024-03-01 DIAGNOSIS — Z79899 Other long term (current) drug therapy: Secondary | ICD-10-CM | POA: Insufficient documentation

## 2024-03-01 DIAGNOSIS — N644 Mastodynia: Secondary | ICD-10-CM

## 2024-03-01 DIAGNOSIS — Z96652 Presence of left artificial knee joint: Secondary | ICD-10-CM | POA: Diagnosis not present

## 2024-03-01 DIAGNOSIS — I10 Essential (primary) hypertension: Secondary | ICD-10-CM | POA: Insufficient documentation

## 2024-03-01 LAB — I-STAT CHEM 8, ED
BUN: 17 mg/dL (ref 8–23)
Calcium, Ion: 1.11 mmol/L — ABNORMAL LOW (ref 1.15–1.40)
Chloride: 100 mmol/L (ref 98–111)
Creatinine, Ser: 1 mg/dL (ref 0.44–1.00)
Glucose, Bld: 92 mg/dL (ref 70–99)
HCT: 37 % (ref 36.0–46.0)
Hemoglobin: 12.6 g/dL (ref 12.0–15.0)
Potassium: 3.2 mmol/L — ABNORMAL LOW (ref 3.5–5.1)
Sodium: 140 mmol/L (ref 135–145)
TCO2: 29 mmol/L (ref 22–32)

## 2024-03-01 MED ORDER — IOHEXOL 350 MG/ML SOLN
75.0000 mL | Freq: Once | INTRAVENOUS | Status: AC | PRN
Start: 2024-03-01 — End: 2024-03-01
  Administered 2024-03-01: 75 mL via INTRAVENOUS

## 2024-03-01 NOTE — Discharge Instructions (Signed)
 Follow up with your primary care doctor.

## 2024-03-01 NOTE — ED Provider Triage Note (Signed)
 Emergency Medicine Provider Triage Evaluation Note  Aimee [name] , a 63 y.o. female  was evaluated in triage.  Pt complains of breast lump.  Review of Systems  Positive:  Negative:   Physical Exam  BP (!) 145/91 (BP Location: Right Arm)   Pulse 81   Temp 98.3 F (36.8 C)   Resp 17   SpO2 94%  Gen:   Awake, no distress   Resp:  Normal effort  MSK:   Moves extremities without difficulty  Other:    Medical Decision Making  Medically screening exam initiated at 3:10 PM.  Appropriate orders placed.  Aimee [name] was informed that the remainder of the evaluation will be completed by another provider, this initial triage assessment does not replace that evaluation, and the importance of remaining in the ED until their evaluation is complete.  Patient states that her breast doctor sent her here for CT imaging of a small mass on the left upper chest. Patient initially noticed this mass 2 days ago. Patient denies fever, chest pain, SOB, nausea, vomiting, diarrhea.   Aimee [name], [place] 03/01/24 1511

## 2024-03-01 NOTE — ED Notes (Signed)
 Called CT regarding iv placement.

## 2024-03-01 NOTE — ED Provider Notes (Signed)
 [place] EMERGENC[facility] DEPARTMENT AT [facility] Provider Note  CSN: [id number] Arrival date & time: 03/01/24 1452  Chief Complaint(s) Breast Mass  HPI Aimee [name] is a 63 y.o. female who is here today from the breast center for CT imaging of the chest to confirm that there is no bleeding surrounding a presumed lipoma.  Patient noticed a mass today, went to her PCP, who sent her to the breast center.  Patient had a mammogram done that was negative.  Breast and I recommended she get a CT image to ensure that there was no bleeding behind presumed lipoma.   Past Medical History Past Medical History:  Diagnosis Date   Allergy    Anxiety    Arthritis    Depression    Family history of breast cancer    Family history of colon cancer    Family history of pancreatic cancer    GERD (gastroesophageal reflux disease)    Heart murmur    per PCP   Hyperlipidemia 11/30/2023   No rx   Hypertension    Joint pain    Obstructive sleep apnea 10/06/2022   Rheumatoid arthritis (HCC)    Patient Active Problem List   Diagnosis Date Noted   Hyperlipidemia 11/30/2023   Primary osteoarthritis of left knee 03/20/2023   BMI 39.0-39.9,adult 12/14/2022   Insulin  resistance 11/15/2022   Other fatigue 11/01/2022   SOB (shortness of breath) on exertion 11/01/2022   BMI 40.0-44.9, adult (HCC)-current bmi 41.9 11/01/2022   Mood disorder (HCC)- emotional eating 11/01/2022   OSA on CPAP 11/01/2022   Vitamin D  deficiency 11/01/2022   Obstructive sleep apnea 10/06/2022   Excessive daytime sleepiness 08/18/2022   Morbid obesity (HCC) 08/18/2022   Snoring 07/20/2022   Leg cramping 07/20/2022   Family history of pancreatic cancer    Family history of breast cancer    Family history of colon cancer    Obesity 08/12/2020   Degenerative disc disease, cervical 11/06/2019   Genetic testing 06/17/2019   Degenerative arthritis of knee, bilateral 02/05/2019   Depression, major, single episode,  moderate (HCC) 10/10/2018   PVC (premature ventricular contraction) 01/11/2018   OA (osteoarthritis) of knee 09/25/2017   AC separation, left, initial encounter 05/26/2017   Acute left-sided low back pain without sciatica 05/26/2017   Hypertension 04/06/2017   Anxiety 04/06/2017   Arthritis 04/06/2017   Sebaceous cyst 12/09/2014   Home Medication(s) Prior to Admission medications   Medication Sig Start Date End Date Taking? Authorizing Provider  albuterol  (VENTOLIN  HFA) 108 (90 Base) MCG/ACT inhaler INHALE 2 PUFFS INTO THE LUNGS EVERY 6 HOURS AS NEEDED FOR WHEEZING OR SHORTNESS OF BREATH 12/02/22   Job Lukes, PA  ALPRAZolam  (XANAX ) 0.5 MG tablet Take 0.5 mg by mouth 4 (four) times daily as needed for anxiety. 03/22/17   [provider]  amLODipine  (NORVASC ) 10 MG tablet Take 1 tablet (10 mg total) by mouth daily. 11/30/23   Job Lukes, PA  beclomethasone (QVAR  REDIHALER) 40 MCG/ACT inhaler Inhale 1 puff into the lungs 2 (two) times daily. 11/16/22   Job Lukes, PA  Calcium Polycarbophil (FIBER-CAPS PO) Take 2 capsules by mouth daily in the afternoon.    [provider]  Cholecalciferol (VITAMIN D3) 25 MCG (1000 UT) capsule Take 1 capsule (1,000 Units total) by mouth daily. 11/15/22   Opalski, Barnie, DO  clotrimazole-betamethasone (LOTRISONE) cream Apply 1 Application topically 2 (two) times daily. 10/23/23   [provider]  ferrous sulfate  325 (65 FE) MG  tablet Take 325 mg by mouth daily.    [provider]  FLUoxetine  (PROZAC ) 40 MG capsule Take 40 mg by mouth daily.    [provider]  hydrochlorothiazide  (HYDRODIURIL ) 25 MG tablet Take 1 tablet (25 mg total) by mouth daily. 11/30/23 11/29/24  Job Lukes, PA  montelukast  (SINGULAIR ) 10 MG tablet TAKE 1 TABLET(10 MG) BY MOUTH AT BEDTIME 11/30/23   Job Lukes, PA  Multiple Vitamin (MULTIVITAMIN) tablet Take 1 tablet by mouth daily.    [provider]  Multiple  Vitamin (MULTIVITAMIN) tablet Take 1 tablet by mouth daily.    [provider]  omeprazole (PRILOSEC) 20 MG capsule Take 20 mg by mouth daily. 09/30/22   [provider]  triamcinolone  (KENALOG ) 0.1 % Apply 1 Application topically daily. 07/13/20   [provider]                                                                                                                                    Past Surgical History Past Surgical History:  Procedure Laterality Date   ABDOMINAL HYSTERECTOMY  2004   arm surgery Left    CESAREAN SECTION  2003   KNEE ARTHROSCOPY Right 2013   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   Procedure: TOTAL KNEE ARTHROPLASTY;  Surgeon: Melodi Lerner, MD;  Location: [facility];  Service: Orthopedics;  Laterality: Left;   Family History Family History  Problem Relation Age of Onset   Colon cancer Mother 8   Cancer Mother    Diabetes Mother    High blood pressure Mother    Obesity Mother    Heart disease Father        CHF   Hypertension Father    Heart attack Father 4   Stroke Father    Alcoholism Father    Diabetes Sister    Breast cancer Sister 45   Congestive Heart Failure Sister    Heart attack Sister    Pancreatic cancer Sister 84   Heart failure Brother    Heart disease Brother        stent   Heart Problems Paternal Uncle    Colon cancer Maternal Grandfather        dx >50    Social History Social History   Tobacco Use   Smoking status: Never   Smokeless tobacco: Never  Vaping Use   Vaping status: Never Used  Substance Use Topics   Alcohol use: No   Drug use: No   Allergies Patient has no known allergies.  Review of Systems Review of Systems  Physical Exam Vital Signs  I have reviewed the triage vital signs BP (!) 159/103   Pulse 72   Temp 98.5 F (36.9 C)   Resp 18   SpO2 100%   Physical Exam Musculoskeletal:     Comments: Small, soft, mobile lump in the left superior pectoralis  Skin:    General:  Skin  is warm.     Findings: No rash.     ED Results and Treatments Labs (all labs ordered are listed, but only abnormal results are displayed) Labs Reviewed  I-STAT CHEM 8, ED - Abnormal; Notable for the following components:      Result Value   Potassium 3.2 (*)    Calcium, Ion 1.11 (*)    All other components within normal limits                                                                                                                          Radiology CT Chest W Contrast Result Date: 03/01/2024 CLINICAL DATA:  Provided history: Chest wall mass, US maximiano nondiagnostic Technologist notes state breast mass. EXAM: CT CHEST WITH CONTRAST TECHNIQUE: Multidetector CT imaging of the chest was performed during intravenous contrast administration. RADIATION DOSE REDUCTION: This exam was performed according to the departmental dose-optimization program which includes automated exposure control, adjustment of the mA and/or kV according to patient size and/or use of iterative reconstruction technique. CONTRAST:  75mL OMNIPAQUE IOHEXOL 350 MG/ML SOLN COMPARISON:  Breast ultrasound earlier today. Included portions from cardiac CT 11/09/2021 reviewed FINDINGS: Cardiovascular: Normal caliber thoracic aorta, no acute aortic findings. There is no central pulmonary embolus to the segmental level. The heart is normal in size. No pericardial effusion. Mediastinum/Nodes: No enlarged mediastinal or hilar lymph nodes. There is a 13 mm left axillary node, with additional prominent nodes in the low left axilla. No esophageal wall thickening. No visible thyroid  nodule. Lungs/Pleura: No focal airspace disease, pleural effusion, or features of pulmonary edema. No suspicious pulmonary nodule or mass. Upper Abdomen: No acute findings. Simple cyst in the right kidney. No further follow-up imaging is recommended. Musculoskeletal: There is no evidence of pectoralis hemorrhage. There is asymmetric fat density within or  between the upper left pectoralis musculature, series 3, image 4 that may represent an intramuscular lipoma, but no internal complexity. No other chest wall mass, fluid collection or inflammation. No obvious breast mass by CT no focal bone lesion or acute osseous findings. IMPRESSION: 1. Asymmetric fat density within or between the upper left pectoralis musculature may represent an intramuscular or soft tissue lipoma, but no internal complexity. No other chest wall mass, fluid collection or inflammation. No evidence of active bleeding. 2. Mildly enlarged left axillary lymph nodes, nonspecific. Electronically Signed   By: Andrea Gasman M.D.   On: 03/01/2024 18:03   MM 3D DIAGNOSTIC MAMMOGRAM UNILATERAL LEFT BREAST Result Date: 03/01/2024 CLINICAL DATA:  Large upper LEFT chest wall palpable abnormality identified 2 days ago. Normal screening mammogram in March 2025. EXAM: DIGITAL DIAGNOSTIC UNILATERAL LEFT MAMMOGRAM WITH TOMOSYNTHESIS AND CAD; ULTRASOUND LEFT BREAST LIMITED TECHNIQUE: Left digital diagnostic mammography and breast tomosynthesis was performed. The images were evaluated with computer-aided detection. ; Targeted ultrasound examination of the left breast was performed. COMPARISON:  Previous exam(s). ACR Breast Density Category b: There are scattered areas of fibroglandular density. FINDINGS: Spot compression tomosynthesis views were  obtained over the palpable area of concern in the LEFT breast. No suspicious mammographic finding is identified in this area. No suspicious mass, microcalcification, or other finding is identified in the LEFT breast. Targeted LEFT breast ultrasound was performed in the palpable area of concern at the superior LEFT chest wall, centered at the 12 o'clock position. On physical examination there is a large, very firm palpable abnormality overlying the superior chest wall. On ultrasound, there is marked expansion of the underlying pectoralis muscle without discrete mass. No  suspicious solid or cystic mass is identified. IMPRESSION: Large LEFT breast palpable abnormality corresponds with an expansile, heterogeneous appearing pectoralis muscle, raising concern for possible hemorrhage. Further evaluation with CTA of the chest to exclude active hemorrhage or underlying mass is recommended. RECOMMENDATION: 1. Patient referred to the [facility] emergency department (signout provided to Dr. Duwaine Fusi via phone by Dr. Anice) for CTA of the chest to exclude active hemorrhage and exclude underlying mass. 2.  Screening mammogram due in March 2026. I have discussed the findings and recommendations with the patient. If applicable, a reminder letter will be sent to the patient regarding the next appointment. BI-RADS CATEGORY  2: Benign. Electronically Signed   By: Norleen Anice M.D.   On: 03/01/2024 14:41   US  LIMITED ULTRASOUND INCLUDING AXILLA LEFT BREAST  Result Date: 03/01/2024 CLINICAL DATA:  Large upper LEFT chest wall palpable abnormality identified 2 days ago. Normal screening mammogram in March 2025. EXAM: DIGITAL DIAGNOSTIC UNILATERAL LEFT MAMMOGRAM WITH TOMOSYNTHESIS AND CAD; ULTRASOUND LEFT BREAST LIMITED TECHNIQUE: Left digital diagnostic mammography and breast tomosynthesis was performed. The images were evaluated with computer-aided detection. ; Targeted ultrasound examination of the left breast was performed. COMPARISON:  Previous exam(s). ACR Breast Density Category b: There are scattered areas of fibroglandular density. FINDINGS: Spot compression tomosynthesis views were obtained over the palpable area of concern in the LEFT breast. No suspicious mammographic finding is identified in this area. No suspicious mass, microcalcification, or other finding is identified in the LEFT breast. Targeted LEFT breast ultrasound was performed in the palpable area of concern at the superior LEFT chest wall, centered at the 12 o'clock position. On physical examination there is a large, very firm  palpable abnormality overlying the superior chest wall. On ultrasound, there is marked expansion of the underlying pectoralis muscle without discrete mass. No suspicious solid or cystic mass is identified. IMPRESSION: Large LEFT breast palpable abnormality corresponds with an expansile, heterogeneous appearing pectoralis muscle, raising concern for possible hemorrhage. Further evaluation with CTA of the chest to exclude active hemorrhage or underlying mass is recommended. RECOMMENDATION: 1. Patient referred to the [facility] emergency department (signout provided to Dr. Duwaine Fusi via phone by Dr. Anice) for CTA of the chest to exclude active hemorrhage and exclude underlying mass. 2.  Screening mammogram due in March 2026. I have discussed the findings and recommendations with the patient. If applicable, a reminder letter will be sent to the patient regarding the next appointment. BI-RADS CATEGORY  2: Benign. Electronically Signed   By: Norleen Anice M.D.   On: 03/01/2024 14:41    Pertinent labs & imaging results that were available during my care of the patient were reviewed by me and considered in my medical decision making (see MDM for details).  Medications Ordered in ED Medications  iohexol (OMNIPAQUE) 350 MG/ML injection 75 mL (75 mLs Intravenous Contrast Given 03/01/24 1744)  Procedures Procedures  (including critical care time)  Medical Decision Making / ED Course   This patient presents to the ED for concern of shoulder/breast mass, this involves an extensive number of treatment options, and is a complaint that carries with it a high risk of complications and morbidity.  The differential diagnosis includes lipoma, malignancy.  MDM: CT imaging ordered, shows a lipoma without surrounding hemorrhage.  Will have patient follow-up with her PCP.   Additional  history obtained: -Additional history obtained from husband at bedside -External records from outside source obtained and reviewed including: Chart review including previous notes, labs, imaging, consultation notes   Lab Tests: -I ordered, reviewed, and interpreted labs.   The pertinent results include:   Labs Reviewed  I-STAT CHEM 8, ED - Abnormal; Notable for the following components:      Result Value   Potassium 3.2 (*)    Calcium, Ion 1.11 (*)    All other components within normal limits     Imaging Studies ordered: I ordered imaging studies including CT chest I independently visualized and interpreted imaging. I agree with the radiologist interpretation   Medicines ordered and prescription drug management: Meds ordered this encounter  Medications   iohexol (OMNIPAQUE) 350 MG/ML injection 75 mL    -I have reviewed the patients home medicines and have made adjustments as needed   Reevaluation: After the interventions noted above, I reevaluated the patient and found that they have :improved  Co morbidities that complicate the patient evaluation  Past Medical History:  Diagnosis Date   Allergy    Anxiety    Arthritis    Depression    Family history of breast cancer    Family history of colon cancer    Family history of pancreatic cancer    GERD (gastroesophageal reflux disease)    Heart murmur    per PCP   Hyperlipidemia 11/30/2023   No rx   Hypertension    Joint pain    Obstructive sleep apnea 10/06/2022   Rheumatoid arthritis (HCC)         Final Clinical Impression(s) / ED Diagnoses Final diagnoses:  Lipoma of torso     @PCDICTATION @    Mannie Pac T, DO 03/01/24 1940

## 2024-03-01 NOTE — ED Notes (Signed)
 Provider at bedside

## 2024-03-01 NOTE — ED Triage Notes (Signed)
 Pt coming in from the breast center for a CT of her chest . Pt denies pain in her chest and shortness of breath at this time .

## 2024-03-26 LAB — AMB RESULTS CONSOLE CBG: Glucose: 102

## 2024-03-26 NOTE — Progress Notes (Signed)
 Pt has no SDOH needs. Pt given resources for BP control.

## 2024-05-09 ENCOUNTER — Other Ambulatory Visit (HOSPITAL_COMMUNITY): Payer: Self-pay | Admitting: Orthopedic Surgery

## 2024-05-09 DIAGNOSIS — Z96652 Presence of left artificial knee joint: Secondary | ICD-10-CM

## 2024-05-21 ENCOUNTER — Encounter: Payer: Self-pay | Admitting: Physician Assistant

## 2024-05-21 ENCOUNTER — Ambulatory Visit

## 2024-05-21 ENCOUNTER — Ambulatory Visit: Admitting: Physician Assistant

## 2024-05-21 DIAGNOSIS — M25512 Pain in left shoulder: Secondary | ICD-10-CM

## 2024-05-21 DIAGNOSIS — M542 Cervicalgia: Secondary | ICD-10-CM | POA: Diagnosis not present

## 2024-05-21 DIAGNOSIS — Z23 Encounter for immunization: Secondary | ICD-10-CM

## 2024-05-21 MED ORDER — IBUPROFEN 600 MG PO TABS: 600.0000 mg | ORAL_TABLET | Freq: Three times a day (TID) | ORAL | 1 refills | Status: AC | PRN

## 2024-05-21 MED ORDER — METHOCARBAMOL 500 MG PO TABS: 500.0000 mg | ORAL_TABLET | Freq: Three times a day (TID) | ORAL | 1 refills | Status: AC | PRN
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
## 2024-05-23 ENCOUNTER — Ambulatory Visit (INDEPENDENT_AMBULATORY_CARE_PROVIDER_SITE_OTHER)

## 2024-05-23 ENCOUNTER — Ambulatory Visit: Admitting: Family Medicine

## 2024-05-23 VITALS — BP 118/86 | HR 83 | Ht 65.0 in

## 2024-05-23 DIAGNOSIS — M5030 Other cervical disc degeneration, unspecified cervical region: Secondary | ICD-10-CM

## 2024-05-23 DIAGNOSIS — E049 Nontoxic goiter, unspecified: Secondary | ICD-10-CM

## 2024-05-23 DIAGNOSIS — E079 Disorder of thyroid, unspecified: Secondary | ICD-10-CM | POA: Diagnosis not present

## 2024-05-23 DIAGNOSIS — R5383 Other fatigue: Secondary | ICD-10-CM

## 2024-05-23 DIAGNOSIS — M542 Cervicalgia: Secondary | ICD-10-CM

## 2024-05-23 DIAGNOSIS — M545 Low back pain, unspecified: Secondary | ICD-10-CM | POA: Diagnosis not present

## 2024-05-23 LAB — T3, FREE: T3, Free: 3.6 pg/mL (ref 2.3–4.2)

## 2024-05-23 LAB — COMPREHENSIVE METABOLIC PANEL WITH GFR
ALT: 14 U/L (ref 0–35)
AST: 16 U/L (ref 0–37)
Albumin: 4.3 g/dL (ref 3.5–5.2)
Alkaline Phosphatase: 63 U/L (ref 39–117)
BUN: 24 mg/dL — ABNORMAL HIGH (ref 6–23)
CO2: 34 meq/L — ABNORMAL HIGH (ref 19–32)
Calcium: 9.5 mg/dL (ref 8.4–10.5)
Chloride: 100 meq/L (ref 96–112)
Creatinine, Ser: 0.89 mg/dL (ref 0.40–1.20)
GFR: 69.72 mL/min (ref >60.00)
Glucose, Bld: 88 mg/dL (ref 70–99)
Potassium: 3.2 meq/L — ABNORMAL LOW (ref 3.5–5.1)
Sodium: 142 meq/L (ref 135–145)
Total Bilirubin: 0.4 mg/dL (ref 0.2–1.2)
Total Protein: 7.7 g/dL (ref 6.0–8.3)

## 2024-05-23 LAB — T4, FREE: Free T4: 0.93 ng/dL (ref 0.60–1.60)

## 2024-05-23 LAB — TSH: TSH: 0.94 u[IU]/mL (ref 0.35–5.50)

## 2024-05-23 MED ORDER — TIZANIDINE HCL 4 MG PO TABS: 4.0000 mg | ORAL_TABLET | Freq: Every day | ORAL | 0 refills | Status: DC
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
## 2024-05-23 NOTE — Patient Instructions (Addendum)
 PT [facility] Zanaflex 4mg  at night Xray lumbar today MRI cervical [phone] Labs See me again in 8 weeks
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
## 2024-05-24 ENCOUNTER — Encounter (HOSPITAL_COMMUNITY)
Admission: RE | Admit: 2024-05-24 | Discharge: 2024-05-24 | Disposition: A | Discharge status: Home / Self Care | Source: Ambulatory Visit | Attending: Orthopedic Surgery | Admitting: Orthopedic Surgery

## 2024-05-24 ENCOUNTER — Ambulatory Visit: Payer: Self-pay | Admitting: Family Medicine

## 2024-05-24 ENCOUNTER — Encounter: Payer: Self-pay | Admitting: Family Medicine

## 2024-05-24 DIAGNOSIS — Z96652 Presence of left artificial knee joint: Secondary | ICD-10-CM | POA: Insufficient documentation

## 2024-05-24 DIAGNOSIS — E049 Nontoxic goiter, unspecified: Secondary | ICD-10-CM | POA: Insufficient documentation

## 2024-05-24 MED ORDER — TECHNETIUM TC 99M MEDRONATE IV KIT
20.0000 | PACK | Freq: Once | INTRAVENOUS | Status: AC | PRN
  Administered 2024-05-24: 19.5 via INTRAVENOUS

## 2024-05-24 NOTE — Assessment & Plan Note (Signed)
 Evaluate labs and thyroid  ultrasound, incidental finding

## 2024-05-25 LAB — PTH, INTACT AND CALCIUM
Calcium: 9.5 mg/dL (ref 8.6–10.4)
PTH: 34 pg/mL (ref 16–77)

## 2024-05-27 ENCOUNTER — Ambulatory Visit: Payer: Self-pay | Admitting: Physician Assistant

## 2024-05-28 ENCOUNTER — Ambulatory Visit
Admission: RE | Admit: 2024-05-28 | Discharge: 2024-05-28 | Disposition: A | Discharge status: Home / Self Care | Source: Ambulatory Visit | Attending: Family Medicine | Admitting: Family Medicine

## 2024-05-28 DIAGNOSIS — E079 Disorder of thyroid, unspecified: Secondary | ICD-10-CM

## 2024-05-29 NOTE — Progress Notes (Signed)
 The patient attended a screening event on 03/26/2024, where her blood pressure was measured at 139/92 mmHg. During the event, the patient reported that she does not smoke, has [facility] for insurance, is established with a primary care provider Sharene?), and does not have any SDOH needs indicated.   A chart review indicates Lucie Buttner, [place] - [place] [facility] as her PCP. Her recent appt with this provider was on 05/21/2024. She has an upcoming appt with her on 12/08/2024 at 8:20 AM. Her bp is currently being monitored by her care team. The pt does not have any [facility] visible encounters with Charlie Croak - [facility], [place]. in the last 12 months. Care Everywhere indicates that her recent appt with [facility] was on 04/22/24. She does have two upcoming appts for 06/12/24 at 9:30 AM and 06/13/24 at 2:40 PM. Chart review further indicates that he is her OBGYN. The pt also has [employer] for her insurance and no SDOH needs per chart review.   [facility] sent courtesy letter with bp education and healthy cooking class flyer just in case needed.   At this time, no additional support from the Health Equity Team is indicated.

## 2024-06-11 ENCOUNTER — Ambulatory Visit: Attending: Family Medicine

## 2024-06-11 ENCOUNTER — Other Ambulatory Visit: Payer: Self-pay

## 2024-06-11 DIAGNOSIS — M546 Pain in thoracic spine: Secondary | ICD-10-CM | POA: Insufficient documentation

## 2024-06-11 DIAGNOSIS — M6281 Muscle weakness (generalized): Secondary | ICD-10-CM | POA: Diagnosis present

## 2024-06-11 DIAGNOSIS — M542 Cervicalgia: Secondary | ICD-10-CM | POA: Diagnosis present

## 2024-06-11 DIAGNOSIS — M25512 Pain in left shoulder: Secondary | ICD-10-CM | POA: Insufficient documentation

## 2024-06-11 DIAGNOSIS — M5459 Other low back pain: Secondary | ICD-10-CM | POA: Insufficient documentation

## 2024-06-11 DIAGNOSIS — M545 Low back pain, unspecified: Secondary | ICD-10-CM | POA: Insufficient documentation
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
## 2024-06-24 ENCOUNTER — Ambulatory Visit: Attending: Family Medicine

## 2024-06-24 DIAGNOSIS — M6281 Muscle weakness (generalized): Secondary | ICD-10-CM | POA: Diagnosis present

## 2024-06-24 DIAGNOSIS — M25512 Pain in left shoulder: Secondary | ICD-10-CM | POA: Diagnosis present

## 2024-06-24 DIAGNOSIS — M5459 Other low back pain: Secondary | ICD-10-CM | POA: Diagnosis present

## 2024-06-24 DIAGNOSIS — M546 Pain in thoracic spine: Secondary | ICD-10-CM | POA: Insufficient documentation

## 2024-06-24 DIAGNOSIS — M542 Cervicalgia: Secondary | ICD-10-CM | POA: Insufficient documentation
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
## 2024-06-26 ENCOUNTER — Ambulatory Visit

## 2024-06-26 DIAGNOSIS — M546 Pain in thoracic spine: Secondary | ICD-10-CM

## 2024-06-26 DIAGNOSIS — M25512 Pain in left shoulder: Secondary | ICD-10-CM | POA: Diagnosis not present

## 2024-06-26 DIAGNOSIS — M5459 Other low back pain: Secondary | ICD-10-CM

## 2024-06-26 DIAGNOSIS — M542 Cervicalgia: Secondary | ICD-10-CM

## 2024-06-26 DIAGNOSIS — M6281 Muscle weakness (generalized): Secondary | ICD-10-CM
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
## 2024-07-01 ENCOUNTER — Ambulatory Visit

## 2024-07-01 ENCOUNTER — Telehealth: Payer: Self-pay

## 2024-07-01 NOTE — Telephone Encounter (Signed)
 PT called and left voicemail for bu then eventually spoke with patient regarding missed appointment. She did not have this one on her calendar. Confirmed next visit.  Aimee [name]   07/01/24 2:42 PM
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
## 2024-07-03 ENCOUNTER — Ambulatory Visit: Admitting: Physical Therapy

## 2024-07-03 ENCOUNTER — Encounter: Payer: Self-pay | Admitting: Physical Therapy

## 2024-07-03 DIAGNOSIS — M25512 Pain in left shoulder: Secondary | ICD-10-CM | POA: Diagnosis not present

## 2024-07-03 DIAGNOSIS — M542 Cervicalgia: Secondary | ICD-10-CM

## 2024-07-03 DIAGNOSIS — M546 Pain in thoracic spine: Secondary | ICD-10-CM
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
## 2024-07-08 ENCOUNTER — Ambulatory Visit

## 2024-07-08 DIAGNOSIS — M25512 Pain in left shoulder: Secondary | ICD-10-CM

## 2024-07-08 DIAGNOSIS — M546 Pain in thoracic spine: Secondary | ICD-10-CM

## 2024-07-08 DIAGNOSIS — M542 Cervicalgia: Secondary | ICD-10-CM
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
## 2024-07-09 ENCOUNTER — Other Ambulatory Visit: Payer: Self-pay | Admitting: Family Medicine

## 2024-07-10 ENCOUNTER — Ambulatory Visit

## 2024-07-10 DIAGNOSIS — M25512 Pain in left shoulder: Secondary | ICD-10-CM

## 2024-07-10 DIAGNOSIS — M542 Cervicalgia: Secondary | ICD-10-CM

## 2024-07-10 DIAGNOSIS — M546 Pain in thoracic spine: Secondary | ICD-10-CM
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
## 2024-07-15 ENCOUNTER — Ambulatory Visit: Attending: Family Medicine

## 2024-07-15 DIAGNOSIS — M546 Pain in thoracic spine: Secondary | ICD-10-CM | POA: Diagnosis present

## 2024-07-15 DIAGNOSIS — M25512 Pain in left shoulder: Secondary | ICD-10-CM | POA: Insufficient documentation

## 2024-07-15 DIAGNOSIS — M6281 Muscle weakness (generalized): Secondary | ICD-10-CM | POA: Insufficient documentation

## 2024-07-15 DIAGNOSIS — M542 Cervicalgia: Secondary | ICD-10-CM | POA: Diagnosis present
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
## 2024-07-18 ENCOUNTER — Ambulatory Visit: Admitting: Family Medicine

## 2024-07-18 VITALS — BP 142/84 | HR 94 | Ht 65.0 in | Wt 261.0 lb

## 2024-07-18 DIAGNOSIS — M545 Low back pain, unspecified: Secondary | ICD-10-CM | POA: Diagnosis not present

## 2024-07-18 DIAGNOSIS — M7521 Bicipital tendinitis, right shoulder: Secondary | ICD-10-CM | POA: Insufficient documentation

## 2024-07-18 NOTE — Patient Instructions (Signed)
 Biceps tendonitis Arm compression with repetitive activity Back is doing fantastic See you again in 3 months if needed

## 2024-07-18 NOTE — Assessment & Plan Note (Signed)
 Stable at the moment working with physical therapy.  Discussed already different treatment options including manipulation, imaging, as well as potential injections with patient declined.  Known arthritic changes of multiple joints and will need to monitor.  Follow-up with me again in 3 months

## 2024-07-18 NOTE — Assessment & Plan Note (Signed)
 Right sided bursitis.  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  We discussed an arm compression sleeve.  Discussed home exercises.  Follow-up again in 6 to 12 weeks

## 2024-08-01 ENCOUNTER — Ambulatory Visit

## 2024-08-01 DIAGNOSIS — M25512 Pain in left shoulder: Secondary | ICD-10-CM | POA: Diagnosis not present

## 2024-08-01 DIAGNOSIS — M542 Cervicalgia: Secondary | ICD-10-CM

## 2024-08-01 DIAGNOSIS — M6281 Muscle weakness (generalized): Secondary | ICD-10-CM

## 2024-08-01 DIAGNOSIS — M546 Pain in thoracic spine: Secondary | ICD-10-CM
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
## 2024-12-02 ENCOUNTER — Encounter: Admitting: Physician Assistant

## 2025-01-27 ENCOUNTER — Ambulatory Visit (HOSPITAL_COMMUNITY): Admit: 2025-01-27 | Admitting: Orthopedic Surgery

## 2025-01-27 SURGERY — ARTHROPLASTY, KNEE, TOTAL
Anesthesia: Choice | Site: Knee | Laterality: Right
